# Patient Record
Sex: Female | Born: 1975 | Race: White | Hispanic: No | Smoking: Current every day smoker
Health system: Southern US, Community
[De-identification: ages and names within clinical notes are randomized; demographics above are authoritative.]

## PROBLEM LIST (undated history)

## (undated) DIAGNOSIS — R569 Unspecified convulsions: Secondary | ICD-10-CM

## (undated) DIAGNOSIS — G894 Chronic pain syndrome: Secondary | ICD-10-CM

## (undated) DIAGNOSIS — I10 Essential (primary) hypertension: Secondary | ICD-10-CM

## (undated) DIAGNOSIS — G629 Polyneuropathy, unspecified: Secondary | ICD-10-CM

## (undated) DIAGNOSIS — R7303 Prediabetes: Secondary | ICD-10-CM

## (undated) DIAGNOSIS — B1920 Unspecified viral hepatitis C without hepatic coma: Secondary | ICD-10-CM

## (undated) DIAGNOSIS — F411 Generalized anxiety disorder: Secondary | ICD-10-CM

## (undated) DIAGNOSIS — R000 Tachycardia, unspecified: Secondary | ICD-10-CM

## (undated) DIAGNOSIS — K259 Gastric ulcer, unspecified as acute or chronic, without hemorrhage or perforation: Secondary | ICD-10-CM

## (undated) DIAGNOSIS — E785 Hyperlipidemia, unspecified: Secondary | ICD-10-CM

## (undated) DIAGNOSIS — K7290 Hepatic failure, unspecified without coma: Secondary | ICD-10-CM

## (undated) DIAGNOSIS — N200 Calculus of kidney: Secondary | ICD-10-CM

## (undated) DIAGNOSIS — K219 Gastro-esophageal reflux disease without esophagitis: Secondary | ICD-10-CM

## (undated) DIAGNOSIS — F1910 Other psychoactive substance abuse, uncomplicated: Secondary | ICD-10-CM

## (undated) HISTORY — PX: FOOT SURGERY: SHX648

## (undated) HISTORY — DX: Tachycardia, unspecified: R00.0

## (undated) HISTORY — DX: Polyneuropathy, unspecified: G62.9

## (undated) HISTORY — PX: OTHER SURGICAL HISTORY: SHX169

## (undated) HISTORY — DX: Hyperlipidemia, unspecified: E78.5

## (undated) HISTORY — DX: Generalized anxiety disorder: F41.1

## (undated) HISTORY — PX: TUBAL LIGATION: SHX77

---

## 2000-06-07 ENCOUNTER — Ambulatory Visit (HOSPITAL_COMMUNITY): Admission: RE | Admit: 2000-06-07 | Discharge: 2000-06-07 | Payer: Self-pay

## 2001-03-01 ENCOUNTER — Encounter: Payer: Self-pay | Admitting: Emergency Medicine

## 2001-03-01 ENCOUNTER — Emergency Department (HOSPITAL_COMMUNITY): Admission: EM | Admit: 2001-03-01 | Discharge: 2001-03-01 | Payer: Self-pay | Admitting: Emergency Medicine

## 2002-04-05 ENCOUNTER — Encounter: Payer: Self-pay | Admitting: Emergency Medicine

## 2002-04-05 ENCOUNTER — Emergency Department (HOSPITAL_COMMUNITY): Admission: EM | Admit: 2002-04-05 | Discharge: 2002-04-05 | Payer: Self-pay | Admitting: Emergency Medicine

## 2002-05-15 ENCOUNTER — Emergency Department (HOSPITAL_COMMUNITY): Admission: EM | Admit: 2002-05-15 | Discharge: 2002-05-15 | Payer: Self-pay | Admitting: Emergency Medicine

## 2002-08-07 ENCOUNTER — Emergency Department (HOSPITAL_COMMUNITY): Admission: EM | Admit: 2002-08-07 | Discharge: 2002-08-07 | Payer: Self-pay | Admitting: *Deleted

## 2002-09-28 ENCOUNTER — Encounter: Payer: Self-pay | Admitting: *Deleted

## 2002-09-28 ENCOUNTER — Ambulatory Visit (HOSPITAL_COMMUNITY): Admission: RE | Admit: 2002-09-28 | Discharge: 2002-09-28 | Payer: Self-pay | Admitting: *Deleted

## 2002-10-17 ENCOUNTER — Ambulatory Visit (HOSPITAL_COMMUNITY): Admission: AD | Admit: 2002-10-17 | Discharge: 2002-10-17 | Payer: Self-pay | Admitting: *Deleted

## 2002-10-27 ENCOUNTER — Ambulatory Visit (HOSPITAL_COMMUNITY): Admission: AD | Admit: 2002-10-27 | Discharge: 2002-10-27 | Payer: Self-pay | Admitting: *Deleted

## 2002-11-14 ENCOUNTER — Ambulatory Visit (HOSPITAL_COMMUNITY): Admission: AD | Admit: 2002-11-14 | Discharge: 2002-11-14 | Payer: Self-pay | Admitting: *Deleted

## 2002-11-22 ENCOUNTER — Inpatient Hospital Stay (HOSPITAL_COMMUNITY): Admission: AD | Admit: 2002-11-22 | Discharge: 2002-11-24 | Payer: Self-pay | Admitting: *Deleted

## 2003-01-24 ENCOUNTER — Ambulatory Visit (HOSPITAL_COMMUNITY): Admission: RE | Admit: 2003-01-24 | Discharge: 2003-01-24 | Payer: Self-pay | Admitting: *Deleted

## 2003-03-21 ENCOUNTER — Other Ambulatory Visit: Admission: RE | Admit: 2003-03-21 | Discharge: 2003-03-21 | Payer: Self-pay | Admitting: *Deleted

## 2003-07-01 ENCOUNTER — Emergency Department (HOSPITAL_COMMUNITY): Admission: EM | Admit: 2003-07-01 | Discharge: 2003-07-01 | Payer: Self-pay | Admitting: Emergency Medicine

## 2003-08-27 ENCOUNTER — Emergency Department (HOSPITAL_COMMUNITY): Admission: EM | Admit: 2003-08-27 | Discharge: 2003-08-27 | Payer: Self-pay | Admitting: Emergency Medicine

## 2004-07-08 ENCOUNTER — Ambulatory Visit (HOSPITAL_COMMUNITY): Admission: RE | Admit: 2004-07-08 | Discharge: 2004-07-08 | Payer: Self-pay | Admitting: *Deleted

## 2005-03-28 ENCOUNTER — Emergency Department (HOSPITAL_COMMUNITY): Admission: EM | Admit: 2005-03-28 | Discharge: 2005-03-28 | Payer: Self-pay | Admitting: Emergency Medicine

## 2005-04-01 ENCOUNTER — Emergency Department (HOSPITAL_COMMUNITY): Admission: EM | Admit: 2005-04-01 | Discharge: 2005-04-01 | Payer: Self-pay | Admitting: Emergency Medicine

## 2006-07-31 ENCOUNTER — Emergency Department (HOSPITAL_COMMUNITY): Admission: EM | Admit: 2006-07-31 | Discharge: 2006-07-31 | Payer: Self-pay | Admitting: Emergency Medicine

## 2006-10-31 ENCOUNTER — Emergency Department (HOSPITAL_COMMUNITY): Admission: EM | Admit: 2006-10-31 | Discharge: 2006-10-31 | Payer: Self-pay | Admitting: Emergency Medicine

## 2007-04-25 ENCOUNTER — Emergency Department (HOSPITAL_COMMUNITY): Admission: EM | Admit: 2007-04-25 | Discharge: 2007-04-25 | Payer: Self-pay | Admitting: Emergency Medicine

## 2007-05-05 ENCOUNTER — Emergency Department (HOSPITAL_COMMUNITY): Admission: EM | Admit: 2007-05-05 | Discharge: 2007-05-05 | Payer: Self-pay | Admitting: Emergency Medicine

## 2007-05-13 ENCOUNTER — Ambulatory Visit (HOSPITAL_COMMUNITY): Admission: RE | Admit: 2007-05-13 | Discharge: 2007-05-13 | Payer: Self-pay | Admitting: Family Medicine

## 2007-09-08 ENCOUNTER — Emergency Department (HOSPITAL_COMMUNITY): Admission: EM | Admit: 2007-09-08 | Discharge: 2007-09-08 | Payer: Self-pay | Admitting: Emergency Medicine

## 2007-09-08 ENCOUNTER — Encounter: Payer: Self-pay | Admitting: Orthopedic Surgery

## 2007-09-29 ENCOUNTER — Encounter (INDEPENDENT_AMBULATORY_CARE_PROVIDER_SITE_OTHER): Payer: Self-pay | Admitting: *Deleted

## 2007-09-29 ENCOUNTER — Ambulatory Visit: Payer: Self-pay | Admitting: Orthopedic Surgery

## 2007-09-29 DIAGNOSIS — M25569 Pain in unspecified knee: Secondary | ICD-10-CM

## 2007-09-29 DIAGNOSIS — M24469 Recurrent dislocation, unspecified knee: Secondary | ICD-10-CM

## 2007-09-30 ENCOUNTER — Telehealth: Payer: Self-pay | Admitting: Orthopedic Surgery

## 2007-10-03 ENCOUNTER — Ambulatory Visit (HOSPITAL_COMMUNITY): Admission: RE | Admit: 2007-10-03 | Discharge: 2007-10-03 | Payer: Self-pay | Admitting: Orthopedic Surgery

## 2007-10-06 ENCOUNTER — Encounter: Payer: Self-pay | Admitting: Orthopedic Surgery

## 2007-10-11 ENCOUNTER — Ambulatory Visit: Payer: Self-pay | Admitting: Orthopedic Surgery

## 2007-10-13 ENCOUNTER — Encounter: Payer: Self-pay | Admitting: Orthopedic Surgery

## 2007-11-13 ENCOUNTER — Emergency Department (HOSPITAL_COMMUNITY): Admission: EM | Admit: 2007-11-13 | Discharge: 2007-11-13 | Payer: Self-pay | Admitting: Emergency Medicine

## 2007-11-15 ENCOUNTER — Encounter: Payer: Self-pay | Admitting: Orthopedic Surgery

## 2007-12-02 ENCOUNTER — Encounter: Payer: Self-pay | Admitting: Orthopedic Surgery

## 2008-03-21 ENCOUNTER — Emergency Department (HOSPITAL_COMMUNITY): Admission: EM | Admit: 2008-03-21 | Discharge: 2008-03-21 | Payer: Self-pay | Admitting: Emergency Medicine

## 2008-03-22 ENCOUNTER — Ambulatory Visit (HOSPITAL_COMMUNITY): Admission: RE | Admit: 2008-03-22 | Discharge: 2008-03-22 | Payer: Self-pay | Admitting: Emergency Medicine

## 2009-03-26 ENCOUNTER — Emergency Department (HOSPITAL_COMMUNITY): Admission: EM | Admit: 2009-03-26 | Discharge: 2009-03-26 | Payer: Self-pay | Admitting: Emergency Medicine

## 2009-06-07 ENCOUNTER — Emergency Department (HOSPITAL_COMMUNITY): Admission: EM | Admit: 2009-06-07 | Discharge: 2009-06-07 | Payer: Self-pay | Admitting: Emergency Medicine

## 2009-08-20 ENCOUNTER — Other Ambulatory Visit: Payer: Self-pay | Admitting: Emergency Medicine

## 2009-08-20 ENCOUNTER — Observation Stay (HOSPITAL_COMMUNITY): Admission: EM | Admit: 2009-08-20 | Discharge: 2009-08-20 | Payer: Self-pay | Admitting: Emergency Medicine

## 2009-08-26 ENCOUNTER — Emergency Department (HOSPITAL_COMMUNITY): Admission: EM | Admit: 2009-08-26 | Discharge: 2009-08-26 | Payer: Self-pay | Admitting: Emergency Medicine

## 2009-11-07 ENCOUNTER — Emergency Department (HOSPITAL_COMMUNITY): Admission: EM | Admit: 2009-11-07 | Discharge: 2009-11-07 | Payer: Self-pay | Admitting: Emergency Medicine

## 2009-11-27 ENCOUNTER — Emergency Department (HOSPITAL_COMMUNITY): Admission: EM | Admit: 2009-11-27 | Discharge: 2009-11-27 | Payer: Self-pay | Admitting: Emergency Medicine

## 2009-12-06 ENCOUNTER — Emergency Department (HOSPITAL_COMMUNITY): Admission: EM | Admit: 2009-12-06 | Discharge: 2009-12-06 | Payer: Self-pay | Admitting: Emergency Medicine

## 2009-12-09 ENCOUNTER — Emergency Department (HOSPITAL_COMMUNITY): Admission: EM | Admit: 2009-12-09 | Discharge: 2009-12-09 | Payer: Self-pay | Admitting: Emergency Medicine

## 2010-06-14 ENCOUNTER — Emergency Department (HOSPITAL_COMMUNITY)
Admission: EM | Admit: 2010-06-14 | Discharge: 2010-06-14 | Payer: Self-pay | Source: Home / Self Care | Admitting: Emergency Medicine

## 2010-08-23 ENCOUNTER — Emergency Department (HOSPITAL_COMMUNITY)
Admission: EM | Admit: 2010-08-23 | Discharge: 2010-08-23 | Payer: Self-pay | Source: Home / Self Care | Admitting: Emergency Medicine

## 2010-08-23 LAB — BASIC METABOLIC PANEL
BUN: 8 mg/dL (ref 6–23)
CO2: 25 mEq/L (ref 19–32)
Chloride: 103 mEq/L (ref 96–112)
Creatinine, Ser: 0.91 mg/dL (ref 0.4–1.2)
GFR calc Af Amer: >60 mL/min (ref >60)
GFR calc non Af Amer: >60 mL/min (ref >60)
GFR calc non Af Amer: >60 mL/min (ref >60)
Glucose, Bld: 100 mg/dL — ABNORMAL HIGH (ref 70–99)
Potassium: 4.1 mEq/L (ref 3.5–5.1)
Sodium: 140 mEq/L (ref 135–145)
Sodium: 136 mEq/L (ref 135–145)

## 2010-08-23 LAB — POCT PREGNANCY, URINE: Preg Test, Ur: NEGATIVE

## 2010-08-23 LAB — ACETAMINOPHEN LEVEL: Acetaminophen (Tylenol), Serum: <10 ug/mL — ABNORMAL LOW (ref 10–30)

## 2010-08-23 LAB — GLUCOSE, CAPILLARY: Glucose-Capillary: 123 mg/dL — ABNORMAL HIGH (ref 70–99)

## 2010-08-23 LAB — DIFFERENTIAL
Basophils Relative: 0 % (ref 0–1)
Eosinophils Absolute: 0.1 10*3/uL (ref 0.0–0.7)
Eosinophils Relative: 1 % (ref 0–5)
Lymphs Abs: 3.2 10*3/uL (ref 0.7–4.0)
Monocytes Absolute: 0.7 10*3/uL (ref 0.1–1.0)
Monocytes Relative: 7 % (ref 3–12)

## 2010-08-23 LAB — CBC
HCT: 38.9 % (ref 36.0–46.0)
Hemoglobin: 13.3 g/dL (ref 12.0–15.0)
RBC: 4.4 MIL/uL (ref 3.87–5.11)
RDW: 14.3 % (ref 11.5–15.5)
WBC: 9 10*3/uL (ref 4.0–10.5)

## 2010-08-23 LAB — HEPATIC FUNCTION PANEL
Bilirubin, Direct: 0.1 mg/dL (ref 0.0–0.3)
Total Bilirubin: 0.3 mg/dL (ref 0.3–1.2)
Total Protein: 7.2 g/dL (ref 6.0–8.3)

## 2010-09-10 ENCOUNTER — Emergency Department (HOSPITAL_COMMUNITY)
Admission: EM | Admit: 2010-09-10 | Discharge: 2010-09-10 | Disposition: A | Discharge status: Home / Self Care | Payer: Self-pay | Attending: Emergency Medicine | Admitting: Emergency Medicine

## 2010-09-10 DIAGNOSIS — N61 Mastitis without abscess: Secondary | ICD-10-CM | POA: Insufficient documentation

## 2010-10-07 LAB — CBC
MCHC: 33 g/dL (ref 30.0–36.0)
Platelets: 297 10*3/uL (ref 150–400)
RDW: 15 % (ref 11.5–15.5)
WBC: 13.3 10*3/uL — ABNORMAL HIGH (ref 4.0–10.5)

## 2010-10-07 LAB — BASIC METABOLIC PANEL
BUN: 8 mg/dL (ref 6–23)
Creatinine, Ser: 0.67 mg/dL (ref 0.4–1.2)
GFR calc non Af Amer: >60 mL/min (ref >60)
Glucose, Bld: 103 mg/dL — ABNORMAL HIGH (ref 70–99)

## 2010-10-07 LAB — URINALYSIS, ROUTINE W REFLEX MICROSCOPIC
Bilirubin Urine: NEGATIVE
Ketones, ur: NEGATIVE mg/dL
Nitrite: NEGATIVE
Protein, ur: NEGATIVE mg/dL
Urobilinogen, UA: 1 mg/dL (ref 0.0–1.0)
pH: 6.5 (ref 5.0–8.0)

## 2010-10-07 LAB — DIFFERENTIAL
Basophils Absolute: 0 10*3/uL (ref 0.0–0.1)
Basophils Relative: 0 % (ref 0–1)
Eosinophils Relative: 2 % (ref 0–5)
Lymphocytes Relative: 25 % (ref 12–46)
Neutro Abs: 9.2 10*3/uL — ABNORMAL HIGH (ref 1.7–7.7)

## 2010-10-07 LAB — PREGNANCY, URINE: Preg Test, Ur: NEGATIVE

## 2010-10-12 LAB — CBC
MCV: 88 fL (ref 78.0–100.0)
Platelets: 241 10*3/uL (ref 150–400)
RBC: 4.59 MIL/uL (ref 3.87–5.11)
WBC: 9 10*3/uL (ref 4.0–10.5)

## 2010-10-12 LAB — BASIC METABOLIC PANEL
BUN: 4 mg/dL — ABNORMAL LOW (ref 6–23)
Chloride: 110 mEq/L (ref 96–112)
Creatinine, Ser: 0.56 mg/dL (ref 0.4–1.2)
GFR calc Af Amer: >60 mL/min (ref >60)
GFR calc non Af Amer: >60 mL/min (ref >60)
Potassium: 4.1 mEq/L (ref 3.5–5.1)

## 2010-10-12 LAB — DIFFERENTIAL
Eosinophils Absolute: 0.1 10*3/uL (ref 0.0–0.7)
Lymphocytes Relative: 19 % (ref 12–46)
Lymphs Abs: 1.7 10*3/uL (ref 0.7–4.0)
Monocytes Relative: 3 % (ref 3–12)
Neutrophils Relative %: 77 % (ref 43–77)

## 2010-10-13 LAB — CULTURE, ROUTINE-ABSCESS

## 2010-10-13 LAB — ANAEROBIC CULTURE

## 2010-10-14 LAB — DIFFERENTIAL
Lymphocytes Relative: 28 % (ref 12–46)
Lymphs Abs: 2.3 10*3/uL (ref 0.7–4.0)
Monocytes Relative: 6 % (ref 3–12)
Neutrophils Relative %: 64 % (ref 43–77)

## 2010-10-14 LAB — CBC
HCT: 41.2 % (ref 36.0–46.0)
Platelets: 211 10*3/uL (ref 150–400)
RBC: 4.69 MIL/uL (ref 3.87–5.11)
WBC: 8.3 10*3/uL (ref 4.0–10.5)

## 2010-10-14 LAB — URINALYSIS, ROUTINE W REFLEX MICROSCOPIC
Glucose, UA: NEGATIVE mg/dL
Hgb urine dipstick: NEGATIVE
Protein, ur: NEGATIVE mg/dL
Specific Gravity, Urine: 1.025 (ref 1.005–1.030)
pH: 6 (ref 5.0–8.0)

## 2010-10-14 LAB — URINE MICROSCOPIC-ADD ON

## 2010-10-14 LAB — ETHANOL: Alcohol, Ethyl (B): <5 mg/dL (ref 0–10)

## 2010-10-14 LAB — BASIC METABOLIC PANEL
BUN: 7 mg/dL (ref 6–23)
Creatinine, Ser: 0.84 mg/dL (ref 0.4–1.2)
GFR calc Af Amer: >60 mL/min (ref >60)
GFR calc non Af Amer: >60 mL/min (ref >60)
Potassium: 3.9 mEq/L (ref 3.5–5.1)

## 2010-10-14 LAB — RAPID URINE DRUG SCREEN, HOSP PERFORMED
Barbiturates: NOT DETECTED
Benzodiazepines: POSITIVE — AB

## 2010-10-14 LAB — ACETAMINOPHEN LEVEL: Acetaminophen (Tylenol), Serum: <10 ug/mL — ABNORMAL LOW (ref 10–30)

## 2010-10-29 LAB — URINALYSIS, ROUTINE W REFLEX MICROSCOPIC
Glucose, UA: NEGATIVE mg/dL
pH: 6 (ref 5.0–8.0)

## 2010-10-29 LAB — URINE MICROSCOPIC-ADD ON

## 2010-12-04 ENCOUNTER — Emergency Department (HOSPITAL_COMMUNITY)
Admission: EM | Admit: 2010-12-04 | Discharge: 2010-12-04 | Disposition: A | Discharge status: Home / Self Care | Payer: Self-pay | Attending: Emergency Medicine | Admitting: Emergency Medicine

## 2010-12-04 ENCOUNTER — Emergency Department (HOSPITAL_COMMUNITY): Payer: Self-pay

## 2010-12-04 DIAGNOSIS — S93409A Sprain of unspecified ligament of unspecified ankle, initial encounter: Secondary | ICD-10-CM | POA: Insufficient documentation

## 2010-12-04 DIAGNOSIS — Y92009 Unspecified place in unspecified non-institutional (private) residence as the place of occurrence of the external cause: Secondary | ICD-10-CM | POA: Insufficient documentation

## 2010-12-04 DIAGNOSIS — X500XXA Overexertion from strenuous movement or load, initial encounter: Secondary | ICD-10-CM | POA: Insufficient documentation

## 2010-12-12 NOTE — Discharge Summary (Signed)
Mary Jarvis, Mary Jarvis                        ACCOUNT NO.:  [id number]   MEDICAL RECORD NO.:  [id number]                   PATIENT TYPE:  INP   LOCATION:  A428                                 FACILITY:  [facility]   PHYSICIAN:  Langley Gauss, M.D.                DATE OF BIRTH:  03-28-1976   DATE OF ADMISSION:  11/22/2002  DATE OF DISCHARGE:  11/25/2002                                 DISCHARGE SUMMARY   DIAGNOSIS:  A 35 year old gravida 3, para 1 at 38+ weeks gestation admitted  for induction of labor.   PROCEDURE:  1. Placement of continuous lumbar epidural analgesia.  The patient did     require placement of 2 epidurals to achieve adequate labor block.  2. Low vacuum extraction of a viable vigorous female infant.   COMPLICATIONS:  Delivery was complicated by fetal heart rate decelerations  to 60-70 beats/minute x16 minutes duration as well as postpartum uterine  atony with blood loss and resultant anemia.   DISPOSITION:  The patient is given a copy of the standardized discharge  instructions.  She will follow up in the office in 4-6 weeks time for  postpartum evaluation. In addition, she is advised to contact our office  within 1 week to make arrangements to have a circumcision performed.  The  patient does desire circumcision on a newborn infant, but she has failed to  initiate financial planning which would allow her to pay for this noncovered  service at this time.   LABORATORY DATA:  Pertinent laboratory studies:  Hemoglobin and hematocrit  10.7/31.4 with a white count of 9.4.  On postpartum day #1 hemoglobin 8.8,  hematocrit 26.2 with a white count of 16.9.  Other pertinent laboratory  studies: Arterial cord blood gas obtained on the infant at the time of  delivery reveals a pH of 7.030, pCO2 81.0, pO2 of 15.0.   HOSPITAL COURSE:  See previous dictations.  The patient delivered the p.m.  of 11/22/2002.  The infant did require bag and mask ventilation for  resuscitative  efforts by the nursing staff. Postpartum the infant did very  well. The infant was under the Oxyhood for postdelivery the first p.m.  following admission to the nursery.  Thereafter the infant was rapidly  weaned from the [facility] and was able to stay within the patient's room  without any supplemental oxygen during the remainder of the hospital stay.  The patient, thereafter, did well bonding with the infant.  She bottle fed.  She had no postpartum complications, thus the patient was discharged home on  postpartum day #2; and, as stated previously, to make arrangements to have  infant circumcision performed in our office.  Langley Gauss, M.D.    DC/MEDQ  D:  11/27/2002  T:  11/27/2002  Job:  [id number]

## 2010-12-12 NOTE — H&P (Signed)
   NAMEGLANDA, Jarvis                        ACCOUNT NO.:  [id number]   MEDICAL RECORD NO.:  [id number]                   PATIENT TYPE:  INP   LOCATION:  A428                                 FACILITY:  [facility]   PHYSICIAN:  Langley Gauss, M.D.                DATE OF BIRTH:  Oct 30, 1975   DATE OF ADMISSION:  11/22/2002  DATE OF DISCHARGE:                                HISTORY & PHYSICAL   HISTORY OF PRESENT ILLNESS:  The patient is a 35 year old gravida 3 para 1  at 38-plus weeks gestation admitted for induction of labor.  The patient's  prenatal course had been uncomplicated.  She was given RhoGAM on 10/04/2002  with a resultant anti-D on antibody screen now when the patient presents for  delivery.   PAST MEDICAL HISTORY:  She has a history of reflux disease, previously was  treated for H. pylori infection.   CURRENT MEDICATIONS:  1. Zantac 150 mg p.o. b.i.d. p.r.n.  2. Fioricet one to two every 6 hours p.r.n. headache.   PHYSICAL EXAMINATION:  GENERAL:  No acute distress.  VITAL SIGNS:  Height 5 feet, temperature 97, blood pressure 113/73, pulse  100, respiratory rate is 20.  HEENT:  Negative.  No adenopathy.  Neck is supple.  Thyroid is nonpalpable.  LUNGS:  Clear.  CARDIOVASCULAR:  Regular rate and rhythm.  ABDOMEN:  Soft and nontender.  No surgical scars are identified.  The  patient is vertex presentation by Leopold's maneuvers.  PELVIC:  Normal external genitalia.  Cervix 2, 60, -1, posterior position.   EXTERNAL FETAL MONITOR:  Reassuring fetal heart rate.   ASSESSMENT:  Term intrauterine pregnancy, favorable cervix in this  multiparous patient.   PLAN:  Thus, we will proceed with amniotomy and induce with Pitocin if  clinically indicated.                                               Langley Gauss, M.D.    DC/MEDQ  D:  11/23/2002  T:  11/24/2002  Job:  [id number]

## 2010-12-12 NOTE — Op Note (Signed)
[name], Mary Jarvis                        ACCOUNT NO.:  [id number]   MEDICAL RECORD NO.:  [id number]                   PATIENT TYPE:  INP   LOCATION:  A428                                 FACILITY:  [facility]   PHYSICIAN:  Langley Gauss, M.D.                DATE OF BIRTH:  1976/01/10   DATE OF PROCEDURE:  11/22/2002  DATE OF DISCHARGE:                                 OPERATIVE REPORT   PROCEDURE:  Placement of continuous lumbar epidural analgesia at the L2-L3  interspace performed by Langley Gauss, M.D.   COMPLICATIONS:  None.   SUMMARY:  The epidural previously placed earlier in the day was inadequate  for labor analgesia. Although the patient initial had evidence of a  bilateral block with good relief of labor pain, the patient subsequently had  minimal relief despite administration of 10 mL of 2% lidocaine. The patient  had also received IV Nubain and Phenergan in an effort to provide  significant sedation to tolerate labor; however, this also was inadequate.  Thus, the patient was again placed in the seated position and bony landmarks  were identified. The L2-L3 interspace was chosen. The patient's back was  sterilely prepped and draped in the usual manner. 5 mL of 1% lidocaine plain  injected at the midline of the L2-L3 interspace to raise a small skin wheal.  The 17 gauge Tuohy-Schliff needle is again utilized to identify the epidural  space. On the first attempt, the bony spinous process was encountered thus  the epidural needle was withdrawn and directed slightly caudad. On the  second attempt there was excellent loss of resistance consistent with entry  into the epidural space. Initial test dose of 3 mL of 1.5% lidocaine plus  epinephrine injected through the epidural needle and no signs of CSF or  intravascular injection obtained, thus the epidural catheter is inserted to  a depth of 5 cm, epidural needle is removed, the aspiration test is  negative. A second test dose  of 2 mL of 1.5% lidocaine plus epinephrine  injected through the epidural catheter line. Again no signs of CSF or  intravascular injection obtained. The patient is having evidence of an  appropriately setting up of epidural block, thus in an effort to expedite  the pain relief, an additional 5, 5 mL of 2% lidocaine is injected through  the epidural catheter. The catheter is then secured into place. The patient  is returned to the left lateral supine position at which time she does have  significant heaviness in the legs, warmth and tingling consistent with a  bilateral epidural block. She is thus connected to the infusion pump  containing the standard mixture which will be infused at a rate of 14  mL/hour. Following the procedure, the patient is noted to be 8 cm dilated,  plus 1 station and completely effaced.  Langley Gauss, M.D.    DC/MEDQ  D:  11/22/2002  T:  11/22/2002  Job:  [id number]

## 2010-12-12 NOTE — Op Note (Signed)
NAMEGERRICA, CYGAN                        ACCOUNT NO.:  [id number]   MEDICAL RECORD NO.:  [id number]                   PATIENT TYPE:  INP   LOCATION:  LDR1                                 FACILITY:  [facility]   PHYSICIAN:  Langley Gauss, M.D.                DATE OF BIRTH:  02-Jun-1976   DATE OF PROCEDURE:  11/22/2002  DATE OF DISCHARGE:                                 OPERATIVE REPORT   PROCEDURE:  Placement of continuous lumbar epidural analgesia at the L3-L4  interspace.   COMPLICATIONS:  None.   SUMMARY:  Appropriate informed consent was obtained. The patient is placed  in the seated position, bony landmarks were identified. The L3-L4 interspace  was selected. The patient's back was sterilely prepped and draped utilizing  the epidural kit, 5 mL 1% lidocaine plain injected at the midline of the L3-  L4 interspace to raise a small skin wheal. The 17 gauge Tuohy-Schliff needle  was then utilized with loss of resistance in an air filled glass syringe and  identified the epidural space. The first two attempts were unsuccessful as  bony spinous process was encountered, thus I moved my choice of positions to  the L2-L3 interspace, 5 mL 1% lidocaine plain injected at the site to raise  a small skin wheal. Again the epidural needle was utilized and on the third  attempt again the epidural space was not successfully encountered. Thus, the  patient is reexamined and it is determined that the initial attempts were  slightly off to the right of the midline, thus the L3-L4 interspace is again  utilized. The 17 gauge Tuohy-Schliff needle on this fourth attempt was noted  to enter the epidural space without difficulty. Excellent loss of resistance  was noted, initial test dose 3 mL of 1.5% lidocaine plus epinephrine  injected through the epidural needle. No signs of CSF or intravascular  injection obtained. The epidural catheter was then inserted to a depth of 5  cm. Aspiration test is  negative. Epidural catheter secured into place. Two  boluses and a 5, 5 mL of 2% lidocaine are then injected to set up a rapid  epidural block. The patient is then connected to the infusion pump  containing the standard mixture. She will be treated with bolus of 10 mL  followed by a continuous infusion rate of 15 mL per hour. Following  completion of the procedure, the patient is noted to have evidence of a  bilateral epidural block with good labor analgesia. She did have some  vomiting immediately post procedure; however, fetal heart rate remained  reassuring.                                               Langley Gauss, M.D.    DC/MEDQ  D:  11/22/2002  T:  11/22/2002  Job:  [id number]

## 2010-12-12 NOTE — H&P (Signed)
Mary Jarvis, Mary Jarvis              ACCOUNT NO.:  [id number]   MEDICAL RECORD NO.:  [id number]           PATIENT TYPE:   LOCATION:                                 FACILITY:   PHYSICIAN:  Langley Gauss, MD          DATE OF BIRTH:   DATE OF ADMISSION:  07/08/2004  DATE OF DISCHARGE:  LH                                HISTORY & PHYSICAL   HISTORY OF PRESENT ILLNESS:  The patient is scheduled for removal of IUD,  ___________IUD, and laparoscopic tubal ligation and lysis by bipolar  cautery.  The patient understands and accepts the irreversible nature of the  procedure, and also realizes and accepts the inherent 2% failure rate  associated with the procedure.  The patient had previously considered having  permanent sterilization performed in June 2004, following her most recent  delivery on November 22, 2002;  however, at that time she was somewhat  indecisive and thus delayed her decision.  However, at this point in time  she is absolutely certain regarding her desire for permanent and  irreversible sterilization.  The patient is a 35 year old gravida 3, para 2,  has two living children, is certain she would at no point in time desire any  additional childbearing.   PAST MEDICAL HISTORY:  1.  Two prior vaginal deliveries.  2.  History of reflux disease.  3.  Previously was treated for H. pylori infection.   CURRENT MEDICATIONS:  1.  Lortab p.r.n.  2.  On May 01, 2004, the patient doxycycline and Cipro empirically for      complaints of pelvic pain and menorrhagia.  3.  Xanax p.r.n. 1 mg tablets 1/2 to 1 p.o. q.8h. p.r.n.   ALLERGIES:  No known drug allergies.   REVIEW OF SYSTEMS:  Pertinent for pain on a daily basis, particularly though  with intercourse.  The patient also states that she does experience  dysmenorrhea with her menses.  The ______________IUD was placed without  complications in the office on March 08, 2003.  The patient has had several  ultrasounds since that  point in time which have documented a proper  intrauterine placement, no evidence of any uterine perforation, and string  has always been visible.  The patient will have this removed at time of the  laparoscopic tubal ligation utilizing bipolar cautery.  Review of systems  otherwise negative.   PHYSICAL EXAMINATION:  VITAL SIGNS:  Blood pressure 134/82, pulse 105,  weight 160.  HEENT:  Negative.  No adenopathy.  NECK:  Supple.  Thyroid is nonpalpable.  LUNGS:  Clear.  CARDIOVASCULAR:  Regular rate and rhythm.  ABDOMEN:  Soft and nontender.  No surgical scars are identified.  PELVIC:  Normal external genitalia.  No lesions or ulcerations identified.  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  Cervix  is noted to be without lesions.  Uterus noted to be anteflexed, normal size,  shape, and consistency for a multiparous patient.  The adnexa are noted to  be palpably normal.   ASSESSMENT AND PLAN:  We will  remove IUD.  The patient at this point in time  clearly desires permanent sterilization.     Vira Blanco   DC/MEDQ  D:  06/28/2004  T:  06/28/2004  Job:  [id number]

## 2010-12-12 NOTE — Op Note (Signed)
Mary Jarvis, Mary Jarvis              ACCOUNT NO.:  [id number]   MEDICAL RECORD NO.:  [id number]          PATIENT TYPE:  AMB   LOCATION:  DAY                           FACILITY:  [facility]   PHYSICIAN:  Langley Gauss, MD     DATE OF BIRTH:  03/02/1976   DATE OF PROCEDURE:  07/08/2004  DATE OF DISCHARGE:                                 OPERATIVE REPORT   PREOPERATIVE DIAGNOSES:  1.  Multiparity.  2.  Patient desires permanent sterilization.   PROCEDURES PERFORMED:  1.  Removal of ParaGard intrauterine device.  2.  Laparoscopic tubal ligation utilizing bipolar cautery.   SURGEON:  Langley Gauss, MD   SPECIMENS:  The intact ParaGard IUD with 2 cm long attached string is sent  for permanent specimen only.  No other specimens are obtained during the  operative procedure.   Findings at the time of surgery include a uterus with an enlarged, prominent  fundus with some irregular contours noted posteriorly, which most likely  represent uterine leiomyoma.  There is no adhesive disease within the  anterior or posterior cul-de-sac.  The tubes and ovaries are noted to be  normal in appearance with the exception of slightly enlarged ovaries  bilaterally consistent with developing follicular cysts.   ESTIMATED BLOOD LOSS:  Minimal.   ANESTHESIA:  General endotracheal.   SUMMARY:  The planned operative procedure was discussed with the patient  immediately preoperatively.  It was again confirmed that we would be  removing the intrauterine device.  In addition, laparoscopic tubal ligation  utilizing bipolar cautery was to be performed.  Vital signs were stable.  The patient was taken to the operating room and underwent uncomplicated  induction of general anesthesia, after which time she was placed in the low  lithotomy position and prepped and draped in the usual sterile manner.  A  sterile speculum examination was then performed.  Anterior lip of the cervix  was grasped with a single-tooth  tenaculum.  The string from the ParaGard IUD  was easily visualized.  This was grasped with an Allis clamp and easily  removed.  The specimen is examined and is noted to be a normal-appearing T-  shaped intrauterine device with the copper coils present.  This is handed  off for laboratory specimen.  A Hulka intrauterine manipulator is then  passed through the endocervical os and used to grasp the cervix at 12  o'clock.  I then changed the sterile gloves.  Standing at the patient's left  side, a 1 cm vertical incision is made just inferior to the umbilicus.  Likewise, a suprapubic midline incision is performed.  The abdominal wall  was then elevated.  The Veress needle was then passed through the  subumbilical incision angling perpendicular to the fascial plane.  This is  done atraumatically.  Appropriate loss of resistance is noted.  A drop test  then confirms a proper intraperitoneal placement with no apparent bowel or  vascular injury.  CO2 gas 3.2 L at a filling pressure of less than 17 mmHg  is then inserted.  This results in excellent pneumoperitoneum.  The  Veress  needle is then removed.  The abdominal wall is again elevated.  The 10 mm  trocar and sleeve are inserted through the subumbilical incision.  The  abdominal wall is elevated with my left hand as the trocar and sleeve are  used to angle toward the hollow of the sacrum.  This is done under direct  visualization with the Visiport disposable cannula and trocar.  This is  connected to the camera.  This results in direct visualization as we  atraumatically enter the peritoneal cavity.  Pneumoperitoneum is maintained  throughout the operative procedure.  Pelvic contents as described  previously.  Pertinently no adhesive disease is encountered, all pelvic  structures are noted to be freely mobile.  Additionally, prior to the  procedure gentle traction on the cervix had resulted in excellent descent of  the cervix and associated  uterus with the uterosacral ligaments descending  beyond the hymenal ring.  Under direct visualization suprapubically in the  midline, a disposable 5 mm trocar and sleeve are inserted without difficulty  and inserted atraumatically.  The uterus is then elevated.  The contents as  described previously are photographed.  Each of the fallopian tube is  identified and properly confirmed by tracing them down out to their  fimbriated ends as well as tracing them to their junction with the uterus  itself.  The Kleppinger bipolar cauterization forceps was then utilized with  the bipolar cauterization technique described by Kleppinger, with the most  proximal cauterization being 1 cm from the tubal-uterine junction.  At all  times the cauterizing current is continued until the LED lights up plateaus  down at 1.  This results in extensive tubal destruction bilaterally.  On  each of the fallopian tube, a total of three bipolar cauterizations is  performed, each in direct continuity with the previous and, as stated  previously, the most proximal being 1 cm from the tubal-uterine junction.  Photographs are taken of the tubular destruction.  The remainder of the  pelvis was surveyed as per previously discussed.  Notably, the uterine  fundus is noted to be diffusely enlarged, consistent with the multiparous  stated, with some obvious irregular 1-2 cm areas of nodularity in the  posterior fundal portion of the uterus, which undoubtedly represent all  uterine leiomyoma.  The procedure is then terminated.  Gas is allowed to  escape.  Our instruments are removed.  The incision sites are then closed  utilizing a 2-0 Vicryl suture through-and-through Scarpa's fascia.  This  assists in reapproximation of the skin edges.  Dermabond skin glue is then  utilized to complete the closure of the skin edges.  This results in an excellent and secure closure.  To facilitate postoperative analgesia, a  total of 10 mL of  0.5% bupivacaine plain is then injected along our incision  site.  The patient tolerates this procedure very well.  The specimen as well  as Hulka intrauterine manipulator are removed from the endocervix.  Vital  signs remained stable throughout the operative procedure.  She is reversed  from anesthesia and taken to the recovery room in stable condition, at which  time operative findings discussed with the patient's awaiting family.   DISCHARGE MEDICATIONS:  The patient has received 1 g of IV Ancef immediately  prior to the procedure.  She is given a prescription for __________ for  postoperative pain relief.  She likewise is given a copy of the standardized  discharge instructions and will follow up in  the office in one week's time.  Pertinently, prior to performance of the tubal ligation, I had stated to the  patient that removal of the IUD could result in some deterioration of her  menstrual periods, more specifically with more cramping and bleeding, and  thus it could possibly be indicated for her to reinitiate oral  contraceptives for menstrual regulatory purposes.   DISPOSITION:  The patient did well postoperatively, had stable vital signs,  satisfied all criteria.  Thus, with vital signs stable, HCG negative, the  patient was discharged to home on the same date of service in the company of  her husband.     Vira Blanco   DC/MEDQ  D:  07/08/2004  T:  07/09/2004  Job:  [id number]

## 2010-12-12 NOTE — Op Note (Signed)
Mary Jarvis, Mary Jarvis                        ACCOUNT NO.:  [id number]   MEDICAL RECORD NO.:  [id number]                   PATIENT TYPE:  INP   LOCATION:  A428                                 FACILITY:  [facility]   PHYSICIAN:  Langley Gauss, M.D.                DATE OF BIRTH:  March 19, 1976   DATE OF PROCEDURE:  11/22/2002  DATE OF DISCHARGE:                                 OPERATIVE REPORT   DELIVERY NOTE:   DIAGNOSIS:  A 38+ week intrauterine pregnancy for induction of labor.   PROCEDURE:  Placement of continuous lumbar epidural analgesia, the first  being nonfunctional, requiring replacement; both by Dr. Roylene Reason. [name].   DELIVERY PERFORMED:  Low vacuum extraction of a viable, female infant  delivered over an intact perineum.  Delivery performed by Dr. Roylene Reason.  [name].   ESTIMATED BLOOD LOSS:  Less than 500 cc.   COMPLICATIONS OF DELIVERY:  The patient was noted to have a terminal  deceleration of the fetal heart rate with fetal heart rate of 60-80 beats  per minute times a total of 16 minutes duration during the second stage,  leading up to delivery.  Immediately following delivery the patient was  noted to experience significant uterine atony which required aggressive  fundal massage by Dr. Roylene Reason. [name] to achieve adequate uterine tone.   SUMMARY:  The patient had been admitted for inductin of labor.  The first  epidural was placed with active labor. This initially functioned well;  however, thereafter it was very clear it was nonfunctional.  Thus, with the  patient at 6-8 cm dilatation the epidural is replaced. The second epidural  functioned very well.  The patient did not experience any significant  hypotension following placement of the epidural.  However, shortly after  placement of the epidural the patient had the terminal bradycardia with the  heart rate of 60-80 beats/minute without recovery.  Multiple position  changes were made from left to right.  Digital  examination did not reveal  any evidence of any prolapsed cord.  Cervix was 8 cm dilated at that time.  The patient was noted to have significant uterine activity and felt that  this may represent some uterine hyperactivity.  Pitocin had been  discontinued.  The patient was treated with 0.25 mg of subcu terbutaline.   The patient was again re-examined, noted to be 9 cm dilatation with only an  anterior lip present with the fetal heart rate continuing to decelerate and  an estimated fetal weight of 7 pounds with a clinically adequate pelvis the  patient was placed in the dorsal lithotomy position in an effort to get her  to push.  With subsequent pushing efforts the anterior lip of the cervix was  reduced to 10 cm.  The Kiwi vacuum extractor was then placed on the infant's  vertex.  With the subsequent next 2 contractions keeping the pressure within  the same green range at all times, gentle traction resulted in delivery of  the infant in a direct OA position over the intact perineum.  The mouth and  nares were bulb suctioned of clear amniotic fluid.  The cord was doubly  clamped and cut and the infant was handed immediately to the nursing staff  who are prepared to proceed with the resuscitative efforts.   The resuscitative efforts consisted of bag and mask ventilation. No chest  compressions were required.  Arterial cord gas and cord blood were then  obtained from the placenta.  Gentle traction on the placenta then resulted  in easy delivery of an intact placenta with associated 3-vessel umbilical  cord.   Examination of the patient following delivery reveals intact perineum. No  cervical lacerations.  Vaginal side wall intact.  The patient is noted to  have several external condyloma, the largest being 1-cm in diameter.  These  are noted at the time of delivery and will be removed in an office based  setting.   Following delivery the patient is taken out of the dorsal lithotomy  position  and rolled to her side; at which time, the epidural catheter is removed with  the blue tip noted to be intact.  The infant responded well to the  resuscitative efforts per the nursing staff.  In addition the infant had  been treated with Narcan as the patient had received IV narcotics prior to  replacement of the epidural.  Dr. Vivia Ewing was contacted and is present  to assist in evaluation of the infant.                                               Langley Gauss, M.D.    DC/MEDQ  D:  11/23/2002  T:  11/23/2002  Job:  [id number]

## 2011-04-21 LAB — CBC
Hemoglobin: 14.8
MCHC: 33.7
MCV: 89.5
RBC: 4.91

## 2011-04-21 LAB — BASIC METABOLIC PANEL
CO2: 23
Calcium: 10.3
Chloride: 99
GFR calc Af Amer: >60
Potassium: 3.7
Sodium: 137

## 2011-04-21 LAB — URINALYSIS, ROUTINE W REFLEX MICROSCOPIC
Hgb urine dipstick: NEGATIVE
Nitrite: NEGATIVE
Protein, ur: NEGATIVE
Specific Gravity, Urine: >1.03 — ABNORMAL HIGH
Urobilinogen, UA: 0.2

## 2011-04-21 LAB — DIFFERENTIAL
Basophils Absolute: 0
Basophils Relative: 0
Eosinophils Absolute: 0
Monocytes Absolute: 0.2
Monocytes Relative: 1 — ABNORMAL LOW
Neutro Abs: 14.2 — ABNORMAL HIGH

## 2011-05-07 LAB — URINALYSIS, ROUTINE W REFLEX MICROSCOPIC
Bilirubin Urine: NEGATIVE
Glucose, UA: 100 — AB
Hgb urine dipstick: NEGATIVE
Ketones, ur: NEGATIVE
Protein, ur: NEGATIVE
Urobilinogen, UA: 0.2

## 2011-10-29 ENCOUNTER — Emergency Department (HOSPITAL_COMMUNITY): Payer: No Typology Code available for payment source

## 2011-10-29 ENCOUNTER — Emergency Department (HOSPITAL_COMMUNITY)
Admission: EM | Admit: 2011-10-29 | Discharge: 2011-10-29 | Disposition: A | Discharge status: Home / Self Care | Payer: No Typology Code available for payment source | Attending: Emergency Medicine | Admitting: Emergency Medicine

## 2011-10-29 ENCOUNTER — Encounter (HOSPITAL_COMMUNITY): Payer: Self-pay

## 2011-10-29 DIAGNOSIS — R079 Chest pain, unspecified: Secondary | ICD-10-CM | POA: Insufficient documentation

## 2011-10-29 DIAGNOSIS — N644 Mastodynia: Secondary | ICD-10-CM | POA: Insufficient documentation

## 2011-10-29 DIAGNOSIS — S20219A Contusion of unspecified front wall of thorax, initial encounter: Secondary | ICD-10-CM | POA: Insufficient documentation

## 2011-10-29 DIAGNOSIS — M25519 Pain in unspecified shoulder: Secondary | ICD-10-CM | POA: Insufficient documentation

## 2011-10-29 DIAGNOSIS — R51 Headache: Secondary | ICD-10-CM | POA: Insufficient documentation

## 2011-10-29 DIAGNOSIS — R109 Unspecified abdominal pain: Secondary | ICD-10-CM | POA: Insufficient documentation

## 2011-10-29 DIAGNOSIS — I10 Essential (primary) hypertension: Secondary | ICD-10-CM | POA: Insufficient documentation

## 2011-10-29 DIAGNOSIS — M79609 Pain in unspecified limb: Secondary | ICD-10-CM | POA: Insufficient documentation

## 2011-10-29 DIAGNOSIS — R404 Transient alteration of awareness: Secondary | ICD-10-CM | POA: Insufficient documentation

## 2011-10-29 DIAGNOSIS — R0602 Shortness of breath: Secondary | ICD-10-CM | POA: Insufficient documentation

## 2011-10-29 DIAGNOSIS — M25419 Effusion, unspecified shoulder: Secondary | ICD-10-CM | POA: Insufficient documentation

## 2011-10-29 DIAGNOSIS — H539 Unspecified visual disturbance: Secondary | ICD-10-CM | POA: Insufficient documentation

## 2011-10-29 DIAGNOSIS — R609 Edema, unspecified: Secondary | ICD-10-CM | POA: Insufficient documentation

## 2011-10-29 HISTORY — DX: Essential (primary) hypertension: I10

## 2011-10-29 HISTORY — DX: Calculus of kidney: N20.0

## 2011-10-29 LAB — COMPREHENSIVE METABOLIC PANEL
ALT: 35 U/L (ref 0–35)
AST: 24 U/L (ref 0–37)
CO2: 25 mEq/L (ref 19–32)
Calcium: 9 mg/dL (ref 8.4–10.5)
Creatinine, Ser: 0.85 mg/dL (ref 0.50–1.10)
GFR calc Af Amer: >90 mL/min (ref >90)
GFR calc non Af Amer: 89 mL/min — ABNORMAL LOW (ref >90)
Glucose, Bld: 104 mg/dL — ABNORMAL HIGH (ref 70–99)
Sodium: 135 mEq/L (ref 135–145)
Total Protein: 7.3 g/dL (ref 6.0–8.3)

## 2011-10-29 LAB — CBC
HCT: 41.1 % (ref 36.0–46.0)
MCH: 29.4 pg (ref 26.0–34.0)
MCV: 90.7 fL (ref 78.0–100.0)
Platelets: 234 10*3/uL (ref 150–400)
RDW: 14.2 % (ref 11.5–15.5)

## 2011-10-29 LAB — DIFFERENTIAL
Basophils Absolute: 0 10*3/uL (ref 0.0–0.1)
Eosinophils Absolute: 0.2 10*3/uL (ref 0.0–0.7)
Eosinophils Relative: 2 % (ref 0–5)
Lymphocytes Relative: 37 % (ref 12–46)
Monocytes Absolute: 0.5 10*3/uL (ref 0.1–1.0)

## 2011-10-29 MED ORDER — HYDROCODONE-ACETAMINOPHEN 5-325 MG PO TABS: 1.0000 | ORAL_TABLET | Freq: Four times a day (QID) | ORAL | Status: AC | PRN

## 2011-10-29 MED ORDER — SODIUM CHLORIDE 0.9 % IV SOLN
Freq: Once | INTRAVENOUS | Status: AC
  Administered 2011-10-29: 11:00:00 via INTRAVENOUS

## 2011-10-29 MED ORDER — ONDANSETRON HCL 4 MG/2ML IJ SOLN
4.0000 mg | Freq: Once | INTRAMUSCULAR | Status: AC
  Administered 2011-10-29: 4 mg via INTRAVENOUS
  Filled 2011-10-29: qty 2

## 2011-10-29 MED ORDER — HYDROMORPHONE HCL PF 1 MG/ML IJ SOLN
1.0000 mg | Freq: Once | INTRAMUSCULAR | Status: AC
  Administered 2011-10-29: 0.5 mg via INTRAVENOUS
  Filled 2011-10-29: qty 1

## 2011-10-29 MED ORDER — IOHEXOL 300 MG/ML  SOLN
100.0000 mL | Freq: Once | INTRAMUSCULAR | Status: AC | PRN
  Administered 2011-10-29: 100 mL via INTRAVENOUS

## 2011-10-29 NOTE — ED Notes (Signed)
Patient is speaking with slurred speech not being very responsive.

## 2011-10-29 NOTE — ED Notes (Signed)
Pt noted to be groggy, and slurring her speech. Dr. Terrilee Files notified and advised to decrease dose of dilauded to 0.5mg .
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
## 2011-10-29 NOTE — Discharge Instructions (Signed)
Rest at home for 2-3 days.  Follow up with your md next week.

## 2011-10-29 NOTE — ED Notes (Signed)
Pt seemed to be out of it, pt's speech is slurry, RN notified.

## 2011-10-29 NOTE — ED Notes (Addendum)
Pt very groggy, have to talk loud and call name twice for pt to become alert and talk. Advised edp and questioned dialudid order. Vo to given 0.5mg  dilaudid iv instead of 1mg . Read back and verified. Pt denies being on any narcotics. Pt slurring speech and pupils pinpoint but reactive. Pt oriented x 4 when alert
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
## 2012-05-14 ENCOUNTER — Emergency Department (HOSPITAL_COMMUNITY)
Admission: EM | Admit: 2012-05-14 | Discharge: 2012-05-14 | Disposition: A | Discharge status: Home / Self Care | Payer: Self-pay | Attending: Emergency Medicine | Admitting: Emergency Medicine

## 2012-05-14 ENCOUNTER — Encounter (HOSPITAL_COMMUNITY): Payer: Self-pay | Admitting: Emergency Medicine

## 2012-05-14 DIAGNOSIS — Z0389 Encounter for observation for other suspected diseases and conditions ruled out: Secondary | ICD-10-CM | POA: Insufficient documentation

## 2012-05-14 LAB — URINE MICROSCOPIC-ADD ON

## 2012-05-14 LAB — URINALYSIS, ROUTINE W REFLEX MICROSCOPIC
Nitrite: NEGATIVE
Specific Gravity, Urine: 1.025 (ref 1.005–1.030)
Urobilinogen, UA: 0.2 mg/dL (ref 0.0–1.0)
pH: 6 (ref 5.0–8.0)

## 2012-05-14 LAB — PREGNANCY, URINE: Preg Test, Ur: NEGATIVE

## 2012-05-14 NOTE — ED Notes (Signed)
Per Registration, Pt left AMA.

## 2012-05-14 NOTE — ED Notes (Signed)
Patient with multiple complaints. Right ear pain x 1 week, sore throat x3 days, lower back pain x 3 days with dysuria (burning and straining). Denies fever.

## 2012-05-15 ENCOUNTER — Emergency Department (HOSPITAL_COMMUNITY)
Admission: EM | Admit: 2012-05-15 | Discharge: 2012-05-15 | Disposition: A | Discharge status: Home / Self Care | Payer: Medicaid Other | Attending: Emergency Medicine | Admitting: Emergency Medicine

## 2012-05-15 ENCOUNTER — Encounter (HOSPITAL_COMMUNITY): Payer: Self-pay | Admitting: Emergency Medicine

## 2012-05-15 DIAGNOSIS — F17200 Nicotine dependence, unspecified, uncomplicated: Secondary | ICD-10-CM | POA: Insufficient documentation

## 2012-05-15 DIAGNOSIS — R300 Dysuria: Secondary | ICD-10-CM | POA: Insufficient documentation

## 2012-05-15 DIAGNOSIS — H9209 Otalgia, unspecified ear: Secondary | ICD-10-CM | POA: Insufficient documentation

## 2012-05-15 DIAGNOSIS — I10 Essential (primary) hypertension: Secondary | ICD-10-CM | POA: Insufficient documentation

## 2012-05-15 DIAGNOSIS — R070 Pain in throat: Secondary | ICD-10-CM | POA: Insufficient documentation

## 2012-05-15 LAB — URINALYSIS, ROUTINE W REFLEX MICROSCOPIC
Bilirubin Urine: NEGATIVE
Glucose, UA: NEGATIVE mg/dL
Hgb urine dipstick: NEGATIVE
Ketones, ur: NEGATIVE mg/dL
Protein, ur: NEGATIVE mg/dL
Urobilinogen, UA: 0.2 mg/dL (ref 0.0–1.0)

## 2012-05-15 MED ORDER — ANTIPYRINE-BENZOCAINE 5.4-1.4 % OT SOLN: 3.0000 [drp] | Freq: Four times a day (QID) | OTIC | Status: DC | PRN

## 2012-05-15 NOTE — ED Notes (Signed)
Patient complaining of sore throat, right earache, and burning on urination. States that earache started first then moved into entire right side of face. Complaining that she feels like she has a knot in her throat and as if she has a urinary tract infection.

## 2012-05-15 NOTE — ED Provider Notes (Signed)
History   This chart was scribed for Joya Gaskins, MD by Charolett Bumpers . The patient was seen in room [facility]. Patient's care was started at 0658.   CSN: [id number] Arrival date & time 05/15/12  2952  First MD Initiated Contact with Patient 05/15/12 [phone]      Chief Complaint  Patient presents with  . Otalgia  . Sore Throat  . Dysuria    The history is provided by the patient. No language interpreter was used.   Mary Jarvis is a 36 y.o. female who presents to the Emergency Department complaining of constant, moderate right sided otalgia that started 8 days ago. She states she now has an associated sore throat and had difficulty swallowing. She states her symptoms have gradually worsened. She denies any known injuries to the ear, but reports having a milky discharge from the ear. She states the pain is located mainly behind the ear. She denies any v/d, abdominal pain, vaginal bleeding. She also complains of dysuria and difficulty urinating. She reports the only abdominal surgery she has had is a tubal ligation.   Past Medical History  Diagnosis Date  . Hypertension   . Kidney stones     Past Surgical History  Procedure Date  . Rt foot surgery     History reviewed. No pertinent family history.  History  Substance Use Topics  . Smoking status: Current Every Day Smoker -- 1.0 packs/day    Types: Cigarettes  . Smokeless tobacco: Not on file  . Alcohol Use: No    OB History    Grav Para Term Preterm Abortions TAB SAB Ect Mult Living                  Review of Systems  HENT: Positive for ear pain, sore throat, trouble swallowing and ear discharge.   Gastrointestinal: Negative for vomiting, abdominal pain and diarrhea.  Genitourinary: Positive for dysuria and difficulty urinating. Negative for vaginal bleeding.  All other systems reviewed and are negative.    Allergies  Aspirin; Ibuprofen; and Tramadol  Home Medications   Current Outpatient Rx    Name Route Sig Dispense Refill  . ATENOLOL 25 MG PO TABS Oral Take 25 mg by mouth daily.    Marland Kitchen RANITIDINE HCL 150 MG PO TABS Oral Take 150 mg by mouth 2 (two) times daily.      BP 126/84  Pulse 96  Temp 97.7 F (36.5 C) (Oral)  Resp 20  Ht 5' (1.524 m)  Wt 162 lb (73.483 kg)  BMI 31.64 kg/m2  SpO2 96%  LMP 05/03/2012  Physical Exam CONSTITUTIONAL: Well developed/well nourished HEAD AND FACE: Normocephalic/atraumatic EYES: EOMI/PERRL ENMT: Mucous membranes moist,  uvula midline, pharynx normal, left TM and right TM normal appearance, no mastoid tenderness, no stridor, no cervical lymphadenopathy.  NECK: supple no meningeal signs SPINE:entire spine nontender CV: S1/S2 noted, no murmurs/rubs/gallops noted LUNGS: Lungs are clear to auscultation bilaterally, no apparent distress ABDOMEN: soft, nontender, no rebound or guarding GU:no cva tenderness NEURO: Pt is awake/alert, moves all extremitiesx4 EXTREMITIES: pulses normal, full ROM SKIN: warm, color normal PSYCH: no abnormalities of mood noted  ED Course  Procedures  DIAGNOSTIC STUDIES: Oxygen Saturation is 96% on room air, adequate by my interpretation.    COORDINATION OF CARE:  07:12-Discussed planned course of treatment with the patient including a UA, who is agreeable at this time.  Does not appear to OE or OM, TM is intact.  No distress.  Stable for  d/c  Labs Reviewed  URINALYSIS, ROUTINE W REFLEX MICROSCOPIC - Abnormal; Notable for the following:    APPearance HAZY (*)     Specific Gravity, Urine >1.030 (*)     All other components within normal limits  PREGNANCY, URINE    MDM  Nursing notes including past medical history and social history reviewed and considered in documentation Labs/vital reviewed and considered    I personally performed the services described in this documentation, which was scribed in my presence. The recorded information has been reviewed and considered.         Joya Gaskins, MD 05/15/12 [phone]

## 2012-05-18 ENCOUNTER — Emergency Department (HOSPITAL_COMMUNITY)
Admission: EM | Admit: 2012-05-18 | Discharge: 2012-05-18 | Disposition: A | Discharge status: Home / Self Care | Payer: Medicaid Other | Attending: Emergency Medicine | Admitting: Emergency Medicine

## 2012-05-18 ENCOUNTER — Encounter (HOSPITAL_COMMUNITY): Payer: Self-pay | Admitting: *Deleted

## 2012-05-18 ENCOUNTER — Emergency Department (HOSPITAL_COMMUNITY): Payer: Medicaid Other

## 2012-05-18 DIAGNOSIS — M5430 Sciatica, unspecified side: Secondary | ICD-10-CM | POA: Insufficient documentation

## 2012-05-18 DIAGNOSIS — Z79899 Other long term (current) drug therapy: Secondary | ICD-10-CM | POA: Insufficient documentation

## 2012-05-18 DIAGNOSIS — Z87441 Personal history of nephrotic syndrome: Secondary | ICD-10-CM | POA: Insufficient documentation

## 2012-05-18 DIAGNOSIS — I10 Essential (primary) hypertension: Secondary | ICD-10-CM | POA: Insufficient documentation

## 2012-05-18 DIAGNOSIS — F17200 Nicotine dependence, unspecified, uncomplicated: Secondary | ICD-10-CM | POA: Insufficient documentation

## 2012-05-18 DIAGNOSIS — M5431 Sciatica, right side: Secondary | ICD-10-CM

## 2012-05-18 DIAGNOSIS — Z8711 Personal history of peptic ulcer disease: Secondary | ICD-10-CM | POA: Insufficient documentation

## 2012-05-18 HISTORY — DX: Gastric ulcer, unspecified as acute or chronic, without hemorrhage or perforation: K25.9

## 2012-05-18 MED ORDER — HYDROMORPHONE HCL PF 1 MG/ML IJ SOLN
1.0000 mg | Freq: Once | INTRAMUSCULAR | Status: AC
  Administered 2012-05-18: 1 mg via INTRAMUSCULAR
  Filled 2012-05-18: qty 1

## 2012-05-18 MED ORDER — HYDROCODONE-ACETAMINOPHEN 5-325 MG PO TABS: 1.0000 | ORAL_TABLET | ORAL | Status: AC | PRN

## 2012-05-18 MED ORDER — METHOCARBAMOL 500 MG PO TABS: 1000.0000 mg | ORAL_TABLET | Freq: Four times a day (QID) | ORAL | Status: AC

## 2012-05-18 NOTE — ED Notes (Signed)
Back pain after  Helping lift a pt Pain lower back into rt.hip.
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
## 2012-05-18 NOTE — ED Provider Notes (Signed)
History     CSN: [id number]  Arrival date & time 05/18/12  1059   First MD Initiated Contact with Patient 05/18/12 1112      Chief Complaint  Patient presents with  . 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  She describes sudden onset of right lower pain which radiates through her buttock to her right posterior lower thigh.     There has been no weakness or numbness in the lower extremities and no urinary or bowel retention or incontinence.  Patient does not have a history of cancer or IVDU.  She applied heat and took tylenol which has not improved her pain.   The history is provided by the patient.    Past Medical History  Diagnosis Date  . Hypertension   . Kidney stones   . Gastric ulcer     Past Surgical History  Procedure Date  . Rt foot surgery   . Foot surgery   . Tubal ligation     History reviewed. No pertinent family history.  History  Substance Use Topics  . Smoking status: Current Every Day Smoker -- 1.0 packs/day    Types: Cigarettes  . Smokeless tobacco: Not on file  . Alcohol Use: No    OB History    Grav Para Term Preterm Abortions TAB SAB Ect Mult Living                  Review of Systems  Constitutional: Negative for fever.  Respiratory: Negative for shortness of breath.   Cardiovascular: Negative for chest pain and leg swelling.  Gastrointestinal: Negative for abdominal pain, constipation and abdominal distention.  Genitourinary: Negative for dysuria, urgency, frequency, flank pain and difficulty urinating.  Musculoskeletal: Positive for back pain. Negative for joint swelling and gait problem.  Skin: Negative for rash.  Neurological: Negative for weakness and numbness.    Allergies   Aspirin; Ibuprofen; and Tramadol  Home Medications   Current Outpatient Rx  Name Route Sig Dispense Refill  . ANTIPYRINE-BENZOCAINE 5.4-1.4 % OT SOLN Right Ear Place 3 drops into the right ear 4 (four) times daily as needed for pain. 10 mL 0  . ATENOLOL 25 MG PO TABS Oral Take 25 mg by mouth daily.    Marland Kitchen HYDROCODONE-ACETAMINOPHEN 5-325 MG PO TABS Oral Take 1 tablet by mouth every 4 (four) hours as needed for pain. 15 tablet 0  . METHOCARBAMOL 500 MG PO TABS Oral Take 2 tablets (1,000 mg total) by mouth 4 (four) times daily. 40 tablet 0  . RANITIDINE HCL 150 MG PO TABS Oral Take 150 mg by mouth 2 (two) times daily.      BP 119/73  Pulse 107  Temp 97.9 F (36.6 C) (Oral)  Resp 18  Ht 5' (1.524 m)  Wt 167 lb (75.751 kg)  BMI 32.62 kg/m2  SpO2 99%  LMP 05/03/2012  Physical Exam  Nursing note and vitals reviewed. Constitutional: She appears well-developed and well-nourished.  HENT:  Head: Normocephalic.  Eyes: Conjunctivae normal are normal.  Neck: Normal range of motion. Neck supple.  Cardiovascular: Normal rate and intact distal pulses.        Pedal pulses normal.  Pulmonary/Chest: Effort normal.  Abdominal: Soft. Bowel sounds are normal. She exhibits no distension and no mass.  Musculoskeletal: Normal  range of motion. She exhibits no edema.       Lumbar back: She exhibits tenderness. She exhibits no swelling, no edema and no spasm.  Neurological: She is alert. She has normal strength. She displays no atrophy and no tremor. No sensory deficit. Gait normal.  Reflex Scores:      Patellar reflexes are 2+ on the right side and 2+ on the left side.      Achilles reflexes are 2+ on the right side and 2+ on the left side.      No strength deficit noted in hip and knee flexor and extensor muscle groups.  Ankle flexion and extension intact.  Skin: Skin is warm and dry.  Psychiatric: She has a normal mood and affect.    ED Course  Procedures (including critical care time)  Labs  Reviewed - No data to display Dg Lumbar Spine Complete  05/18/2012  *RADIOLOGY REPORT*  Clinical Data: Low back pain after lifting.  The right leg pain.  LUMBAR SPINE - COMPLETE 4+ VIEW  Comparison: 10/29/2011 CT of the abdomen and pelvis.  Findings: No fracture or dislocation.  Normal alignment.  Minimal L4-5 disc space narrowing.  IMPRESSION: Minimal L4-5 disc space narrowing.   Original Report Authenticated By: Fuller Canada, M.D.      1. Sciatica of right side       MDM  X-rays reviewed and discussed with patient prior to discharge home.  Dilaudid 1 mg IM given during ED visit and pain.  Prescribed hydrocodone and Robaxin for home use.  Encouraged ice care through tomorrow, then may add heat on day 3.  Rest.  Recheck by PCP if not improved or for worsened symptoms.  No neuro deficit on exam or by history to suggest emergent or surgical presentation.  Also discussed worsened sx that should prompt immediate re-evaluation including distal weakness, bowel/bladder retention/incontinence.              Burgess Amor, PA 05/18/12 1314

## 2012-05-18 NOTE — ED Provider Notes (Signed)
Medical screening examination/treatment/procedure(s) were performed by non-physician practitioner and as supervising physician I was immediately available for consultation/collaboration.   Signe Tackitt M Sarath Privott, DO 05/18/12 2028 

## 2012-08-04 ENCOUNTER — Encounter (HOSPITAL_COMMUNITY): Payer: Self-pay | Admitting: *Deleted

## 2012-08-04 ENCOUNTER — Emergency Department (HOSPITAL_COMMUNITY)
Admission: EM | Admit: 2012-08-04 | Discharge: 2012-08-04 | Disposition: A | Discharge status: Home / Self Care | Payer: No Typology Code available for payment source | Attending: Emergency Medicine | Admitting: Emergency Medicine

## 2012-08-04 DIAGNOSIS — I10 Essential (primary) hypertension: Secondary | ICD-10-CM | POA: Insufficient documentation

## 2012-08-04 DIAGNOSIS — Z79899 Other long term (current) drug therapy: Secondary | ICD-10-CM | POA: Insufficient documentation

## 2012-08-04 DIAGNOSIS — Y9389 Activity, other specified: Secondary | ICD-10-CM | POA: Insufficient documentation

## 2012-08-04 DIAGNOSIS — S40019A Contusion of unspecified shoulder, initial encounter: Secondary | ICD-10-CM | POA: Insufficient documentation

## 2012-08-04 DIAGNOSIS — F17200 Nicotine dependence, unspecified, uncomplicated: Secondary | ICD-10-CM | POA: Insufficient documentation

## 2012-08-04 DIAGNOSIS — K259 Gastric ulcer, unspecified as acute or chronic, without hemorrhage or perforation: Secondary | ICD-10-CM | POA: Insufficient documentation

## 2012-08-04 DIAGNOSIS — Z87442 Personal history of urinary calculi: Secondary | ICD-10-CM | POA: Insufficient documentation

## 2012-08-04 DIAGNOSIS — Z8619 Personal history of other infectious and parasitic diseases: Secondary | ICD-10-CM | POA: Insufficient documentation

## 2012-08-04 HISTORY — DX: Unspecified viral hepatitis C without hepatic coma: B19.20

## 2012-08-04 MED ORDER — OXYCODONE HCL 5 MG PO CAPS: ORAL_CAPSULE | ORAL | Status: DC

## 2012-08-04 MED ORDER — OXYCODONE HCL 5 MG PO TABS
5.0000 mg | ORAL_TABLET | Freq: Once | ORAL | Status: AC
  Administered 2012-08-04: 5 mg via ORAL
  Filled 2012-08-04: qty 1
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
## 2012-08-06 NOTE — ED Provider Notes (Signed)
Medical screening examination/treatment/procedure(s) were performed by non-physician practitioner and as supervising physician I was immediately available for consultation/collaboration. Reily Ilic, MD, FACEP   Avigail Pilling L Eulamae Greenstein, MD 08/06/12 0706 

## 2012-09-19 ENCOUNTER — Encounter (HOSPITAL_COMMUNITY): Payer: Self-pay | Admitting: *Deleted

## 2012-09-19 ENCOUNTER — Emergency Department (HOSPITAL_COMMUNITY)
Admission: EM | Admit: 2012-09-19 | Discharge: 2012-09-19 | Disposition: A | Discharge status: Home / Self Care | Payer: Medicaid Other | Attending: Emergency Medicine | Admitting: Emergency Medicine

## 2012-09-19 ENCOUNTER — Emergency Department (HOSPITAL_COMMUNITY): Payer: Medicaid Other

## 2012-09-19 DIAGNOSIS — R1011 Right upper quadrant pain: Secondary | ICD-10-CM | POA: Insufficient documentation

## 2012-09-19 DIAGNOSIS — R141 Gas pain: Secondary | ICD-10-CM | POA: Insufficient documentation

## 2012-09-19 DIAGNOSIS — Z3202 Encounter for pregnancy test, result negative: Secondary | ICD-10-CM | POA: Insufficient documentation

## 2012-09-19 DIAGNOSIS — Z8619 Personal history of other infectious and parasitic diseases: Secondary | ICD-10-CM | POA: Insufficient documentation

## 2012-09-19 DIAGNOSIS — R109 Unspecified abdominal pain: Secondary | ICD-10-CM

## 2012-09-19 DIAGNOSIS — K921 Melena: Secondary | ICD-10-CM | POA: Insufficient documentation

## 2012-09-19 DIAGNOSIS — Z79899 Other long term (current) drug therapy: Secondary | ICD-10-CM | POA: Insufficient documentation

## 2012-09-19 DIAGNOSIS — R6883 Chills (without fever): Secondary | ICD-10-CM | POA: Insufficient documentation

## 2012-09-19 DIAGNOSIS — Z8711 Personal history of peptic ulcer disease: Secondary | ICD-10-CM | POA: Insufficient documentation

## 2012-09-19 DIAGNOSIS — R059 Cough, unspecified: Secondary | ICD-10-CM | POA: Insufficient documentation

## 2012-09-19 DIAGNOSIS — I10 Essential (primary) hypertension: Secondary | ICD-10-CM | POA: Insufficient documentation

## 2012-09-19 DIAGNOSIS — R143 Flatulence: Secondary | ICD-10-CM | POA: Insufficient documentation

## 2012-09-19 DIAGNOSIS — K920 Hematemesis: Secondary | ICD-10-CM | POA: Insufficient documentation

## 2012-09-19 DIAGNOSIS — R05 Cough: Secondary | ICD-10-CM | POA: Insufficient documentation

## 2012-09-19 DIAGNOSIS — F17200 Nicotine dependence, unspecified, uncomplicated: Secondary | ICD-10-CM | POA: Insufficient documentation

## 2012-09-19 DIAGNOSIS — Z87442 Personal history of urinary calculi: Secondary | ICD-10-CM | POA: Insufficient documentation

## 2012-09-19 DIAGNOSIS — R112 Nausea with vomiting, unspecified: Secondary | ICD-10-CM

## 2012-09-19 DIAGNOSIS — R142 Eructation: Secondary | ICD-10-CM | POA: Insufficient documentation

## 2012-09-19 DIAGNOSIS — Z9851 Tubal ligation status: Secondary | ICD-10-CM | POA: Insufficient documentation

## 2012-09-19 LAB — URINALYSIS, ROUTINE W REFLEX MICROSCOPIC
Glucose, UA: NEGATIVE mg/dL
Ketones, ur: NEGATIVE mg/dL
Nitrite: NEGATIVE
Protein, ur: NEGATIVE mg/dL
Urobilinogen, UA: 0.2 mg/dL (ref 0.0–1.0)

## 2012-09-19 LAB — PREGNANCY, URINE: Preg Test, Ur: NEGATIVE

## 2012-09-19 LAB — CBC WITH DIFFERENTIAL/PLATELET
Basophils Absolute: 0 10*3/uL (ref 0.0–0.1)
Basophils Relative: 0 % (ref 0–1)
Hemoglobin: 15.5 g/dL — ABNORMAL HIGH (ref 12.0–15.0)
MCHC: 34 g/dL (ref 30.0–36.0)
Monocytes Relative: 3 % (ref 3–12)
Neutro Abs: 12.7 10*3/uL — ABNORMAL HIGH (ref 1.7–7.7)
Neutrophils Relative %: 79 % — ABNORMAL HIGH (ref 43–77)
Platelets: 294 10*3/uL (ref 150–400)

## 2012-09-19 LAB — COMPREHENSIVE METABOLIC PANEL
ALT: 35 U/L (ref 0–35)
CO2: 22 mEq/L (ref 19–32)
Calcium: 9.9 mg/dL (ref 8.4–10.5)
Chloride: 103 mEq/L (ref 96–112)
Creatinine, Ser: 0.73 mg/dL (ref 0.50–1.10)
GFR calc Af Amer: >90 mL/min (ref >90)
GFR calc non Af Amer: >90 mL/min (ref >90)
Glucose, Bld: 112 mg/dL — ABNORMAL HIGH (ref 70–99)
Sodium: 137 mEq/L (ref 135–145)
Total Bilirubin: 0.8 mg/dL (ref 0.3–1.2)

## 2012-09-19 LAB — URINE MICROSCOPIC-ADD ON

## 2012-09-19 MED ORDER — IOHEXOL 300 MG/ML  SOLN
50.0000 mL | Freq: Once | INTRAMUSCULAR | Status: AC | PRN
  Administered 2012-09-19: 50 mL via ORAL

## 2012-09-19 MED ORDER — PROMETHAZINE HCL 25 MG/ML IJ SOLN
12.5000 mg | Freq: Once | INTRAMUSCULAR | Status: AC
  Administered 2012-09-19: 12.5 mg via INTRAVENOUS
  Filled 2012-09-19: qty 1

## 2012-09-19 MED ORDER — OXYCODONE HCL 5 MG PO TABS: 5.0000 mg | ORAL_TABLET | Freq: Four times a day (QID) | ORAL | Status: DC | PRN

## 2012-09-19 MED ORDER — IOHEXOL 300 MG/ML  SOLN
100.0000 mL | Freq: Once | INTRAMUSCULAR | Status: AC | PRN
  Administered 2012-09-19: 100 mL via INTRAVENOUS

## 2012-09-19 MED ORDER — HYDROMORPHONE HCL PF 1 MG/ML IJ SOLN
0.5000 mg | Freq: Once | INTRAMUSCULAR | Status: AC
  Administered 2012-09-19: 0.5 mg via INTRAVENOUS
  Filled 2012-09-19: qty 1

## 2012-09-19 MED ORDER — PROMETHAZINE HCL 25 MG PO TABS: 25.0000 mg | ORAL_TABLET | Freq: Four times a day (QID) | ORAL | Status: DC | PRN

## 2012-09-19 NOTE — ED Provider Notes (Signed)
Medical screening examination/treatment/procedure(s) were performed by non-physician practitioner and as supervising physician I was immediately available for consultation/collaboration.   Laray Anger, DO 09/19/12 1924

## 2012-09-19 NOTE — ED Notes (Signed)
Sprite zero given 

## 2012-09-19 NOTE — ED Notes (Signed)
C/o n/v, RUQ pain, cough x 3 days.  Reports recently dx with hepatitis C, and has appt with specialist re: liver disease.

## 2012-09-19 NOTE — ED Provider Notes (Signed)
History     CSN: [id number]  Arrival date & time 09/19/12  4098   First MD Initiated Contact with Patient 09/19/12 1020      Chief Complaint  Patient presents with  . Abdominal Pain    (Consider location/radiation/quality/duration/timing/severity/associated sxs/prior treatment) Patient is a 37 y.o. female presenting with abdominal pain. The history is provided by the patient.  Abdominal Pain Pain location:  RUQ Pain quality: cramping and sharp   Pain radiates to:  Does not radiate Pain severity:  Severe Onset quality:  Sudden Timing:  Constant Progression:  Worsening Chronicity:  Recurrent Context: recent illness   Context: not alcohol use, not laxative use, not previous surgeries and not recent travel   Relieved by:  Nothing Worsened by:  Nothing tried Ineffective treatments:  None tried Associated symptoms: belching, chills, cough, hematemesis, hematochezia and nausea   Associated symptoms: no chest pain, no constipation, no dysuria, no fever, no flatus, no hematuria and no shortness of breath   Risk factors: no alcohol abuse, no aspirin use and no NSAID use     Past Medical History  Diagnosis Date  . Hypertension   . Gastric ulcer   . Kidney stones   . Hepatitis C     Past Surgical History  Procedure Laterality Date  . Rt foot surgery    . Foot surgery    . Tubal ligation      No family history on file.  History  Substance Use Topics  . Smoking status: Current Every Day Smoker -- 1.00 packs/day    Types: Cigarettes  . Smokeless tobacco: Not on file  . Alcohol Use: No    OB History   Grav Para Term Preterm Abortions TAB SAB Ect Mult Living                  Review of Systems  Constitutional: Positive for chills. Negative for fever and activity change.       All ROS Neg except as noted in HPI  HENT: Negative for nosebleeds and neck pain.   Eyes: Negative for photophobia and discharge.  Respiratory: Positive for cough. Negative for shortness of  breath and wheezing.   Cardiovascular: Negative for chest pain and palpitations.  Gastrointestinal: Positive for nausea, abdominal pain, hematochezia and hematemesis. Negative for constipation, blood in stool and flatus.  Genitourinary: Negative for dysuria, frequency and hematuria.  Musculoskeletal: Negative for back pain and arthralgias.  Skin: Negative.   Neurological: Negative for dizziness, seizures and speech difficulty.  Psychiatric/Behavioral: Negative for hallucinations and confusion.    Allergies  Aspirin; Ibuprofen; Tramadol; and Tylenol  Home Medications   Current Outpatient Rx  Name  Route  Sig  Dispense  Refill  . antipyrine-benzocaine (AURALGAN) otic solution   Right Ear   Place 3 drops into the right ear 4 (four) times daily as needed for pain.   10 mL   0   . atenolol (TENORMIN) 25 MG tablet   Oral   Take 25 mg by mouth daily.         Marland Kitchen oxycodone (OXY-IR) 5 MG capsule      One tab po q 6 hrs prn pain   12 capsule   0   . ranitidine (ZANTAC) 150 MG tablet   Oral   Take 150 mg by mouth 2 (two) times daily.           BP 145/94  Pulse 128  Temp(Src) 97.7 F (36.5 C) (Oral)  Resp 20  Ht  5' (1.524 m)  Wt 197 lb (89.359 kg)  BMI 38.47 kg/m2  SpO2 97%  LMP 08/29/2012  Physical Exam  Nursing note and vitals reviewed. Constitutional: She is oriented to person, place, and time. She appears well-developed and well-nourished.  Non-toxic appearance.  HENT:  Head: Normocephalic.  Right Ear: Tympanic membrane and external ear normal.  Left Ear: Tympanic membrane and external ear normal.  Eyes: EOM and lids are normal. Pupils are equal, round, and reactive to light.  Neck: Normal range of motion. Neck supple. Carotid bruit is not present.  Cardiovascular: Regular rhythm, normal heart sounds, intact distal pulses and normal pulses.  Tachycardia present.   Pulmonary/Chest: Breath sounds normal. No respiratory distress.  Abdomen is soft with good bowel  sounds. Is no mass or organ enlargement appreciated by percussion. There is pain to palpation in the right mid abdomen, right lower abdomen, and the periumbilical area. There is no rebound. No left CVA tenderness. There is question of right CVA tenderness, as the patient states that the examination makes her right lower anterior abdomen hurt.  Abdominal: Soft. Bowel sounds are normal. There is no tenderness. There is no guarding.  Musculoskeletal: Normal range of motion.  Lymphadenopathy:       Head (right side): No submandibular adenopathy present.       Head (left side): No submandibular adenopathy present.    She has no cervical adenopathy.  Neurological: She is alert and oriented to person, place, and time. She has normal strength. No cranial nerve deficit or sensory deficit.  Skin: Skin is warm and dry.  Psychiatric: She has a normal mood and affect. Her speech is normal.    ED Course  Procedures (including critical care time)  Labs Reviewed  COMPREHENSIVE METABOLIC PANEL  CBC WITH DIFFERENTIAL  PREGNANCY, URINE  URINALYSIS, ROUTINE W REFLEX MICROSCOPIC   No results found.   No diagnosis found.    MDM  I have reviewed nursing notes, vital signs, and all appropriate lab and imaging results for this patient. The patient presented to the emergency department with complaint of cough, right upper quadrant pain, nausea vomiting for 3 days. Patient states she has been recently diagnosed with hepatitis see and is scheduled to see a specialist next week. The patient states she had some fatty meals on February 20 and 21 and after this she began to have pain of her right lower abdomen. It is of note that the patient has history of" stomach ulcers".  Lipase is normal at 22. Comprehensive metabolic panel is normal with exception of glucose being slightly elevated at 112. Complete blood count shows an elevation in the white blood cells of 16,000, hemoglobin slightly low at 15.5, hematocrit  within normal limits at 45.6. There is no bands present or shift to the left. The pregnancy test is negative. The urine analysis shows no acute changes. CT scan of the abdomen and pelvis with contrast shows no acute findings in the abdomen or pelvis.  I have discussed all the findings of the physical examination as well as the laboratory and x-ray findings with the patient. I've offered the patient the opportunity to perform a chest x-ray and a pelvic ultrasound as the testing so far has not revealed the cause of her pain or any of the other abnormal findings at this time. The patient states that she would like to followup with her primary care physician. She does not desire any additional testing at this time. She states that she is much  more comfortable after receiving pain medication and she will desires to return home and followup as an outpatient with her physician as well as with the GI specialist that she has been a sign.  Prescription for promethazine 25 mg one every 6 hours and Roxicodone one every 6 hours as needed for pain #15 given to the patient. Patient invited to return if any changes, problems, or concerns.       Kathie Dike, [place] 09/19/12 [phone]

## 2012-11-02 ENCOUNTER — Other Ambulatory Visit: Payer: Self-pay | Admitting: Nurse Practitioner

## 2012-11-03 NOTE — Telephone Encounter (Signed)
PLEASE PRINT AND NOTIFY PATIENT WHEN READY. LAST SEEN BY TAMMY IN PAIN CLINIC 3/14

## 2012-11-04 ENCOUNTER — Telehealth: Payer: Self-pay | Admitting: *Deleted

## 2012-11-04 ENCOUNTER — Telehealth: Payer: Self-pay | Admitting: Nurse Practitioner

## 2012-11-04 NOTE — Telephone Encounter (Signed)
Tramodol rx at front,  lmom for pt.

## 2012-11-18 ENCOUNTER — Ambulatory Visit (INDEPENDENT_AMBULATORY_CARE_PROVIDER_SITE_OTHER): Payer: Medicaid Other | Admitting: Family Medicine

## 2012-11-18 DIAGNOSIS — Z23 Encounter for immunization: Secondary | ICD-10-CM

## 2012-11-18 NOTE — Progress Notes (Signed)
Patient ID: Mary Jarvis, female   DOB: May 17, 1976, 37 y.o.   MRN: [id number] Patient given twinrix and tolerated well

## 2012-11-28 NOTE — Telephone Encounter (Signed)
App made 

## 2012-11-29 ENCOUNTER — Telehealth: Payer: Self-pay | Admitting: General Practice

## 2012-11-29 ENCOUNTER — Encounter: Payer: Self-pay | Admitting: General Practice

## 2012-11-29 ENCOUNTER — Ambulatory Visit (INDEPENDENT_AMBULATORY_CARE_PROVIDER_SITE_OTHER): Payer: Medicaid Other | Admitting: General Practice

## 2012-11-29 VITALS — BP 134/88 | HR 119 | Temp 97.6°F | Ht 62.0 in | Wt 200.0 lb

## 2012-11-29 DIAGNOSIS — R110 Nausea: Secondary | ICD-10-CM

## 2012-11-29 DIAGNOSIS — R040 Epistaxis: Secondary | ICD-10-CM

## 2012-11-29 LAB — POCT CBC
Granulocyte percent: 58.8 %G (ref 37–80)
HCT, POC: 43.7 % (ref 37.7–47.9)
Hemoglobin: 14 g/dL (ref 12.2–16.2)
MPV: 8.2 fL (ref 0–99.8)
POC Granulocyte: 6.8 (ref 2–6.9)
POC LYMPH PERCENT: 36.3 %L (ref 10–50)
RBC: 5 M/uL (ref 4.04–5.48)

## 2012-11-29 MED ORDER — ONDANSETRON HCL 4 MG PO TABS: 4.0000 mg | ORAL_TABLET | Freq: Three times a day (TID) | ORAL | Status: DC | PRN

## 2012-11-29 NOTE — Progress Notes (Signed)
  Subjective:    Patient ID: Mary Jarvis, female    DOB: March 31, 1976, 37 y.o.   MRN: [id number]  HPI Reports today with for nosebleed for last three days. Onset was sudden and lasted for 2 minutes. OTC medication not tried. Denies known allergies. Reports being seen by hepatologist in [facility] and appointment scheduled on June 11. Denies nose bleeds today. Denies dizziness or known trauma. Complains of generalized chronic pain and reports being seen at pain management clinic.     Review of Systems  Constitutional: Negative for fever and chills.  HENT: Positive for nosebleeds. Negative for rhinorrhea, sneezing, postnasal drip, sinus pressure and ear discharge.   Eyes: Negative for visual disturbance.  Respiratory: Negative for cough, chest tightness and shortness of breath.   Cardiovascular: Negative for chest pain.  Genitourinary: Negative for difficulty urinating.  Skin: Negative.   Neurological: Negative for dizziness, syncope, weakness and headaches.       Objective:   Physical Exam  Constitutional: She is oriented to person, place, and time. She appears well-developed and well-nourished.  HENT:  Head: Normocephalic and atraumatic.  Right Ear: External ear normal.  Left Ear: External ear normal.  Nose: No mucosal edema, sinus tenderness, nasal deformity or septal deviation. No epistaxis. Right sinus exhibits no maxillary sinus tenderness and no frontal sinus tenderness. Left sinus exhibits no maxillary sinus tenderness and no frontal sinus tenderness.  Mouth/Throat: Mucous membranes are normal.  Cardiovascular: Normal rate, regular rhythm and normal heart sounds.   Pulmonary/Chest: Effort normal and breath sounds normal.  Neurological: She is alert and oriented to person, place, and time.  Skin: Skin is warm and dry.  Psychiatric: She has a normal mood and affect.          Assessment & Plan:  Take medications as prescribed Labs pending (CBC) Continue appointment to pain  management clinic Keep appointment with hepatologist Continue stress management and antianxiety techniques Patient verbalized understanding Raymon Mutton, FNP-C

## 2012-11-29 NOTE — Patient Instructions (Addendum)
Nosebleed Nosebleeds can be caused by many conditions including trauma, infections, polyps, foreign bodies, dry mucous membranes or climate, medications and air conditioning. Most nosebleeds occur in the front of the nose. It is because of this location that most nosebleeds can be controlled by pinching the nostrils gently and continuously. Do this for at least 10 to 20 minutes. The reason for this long continuous pressure is that you must hold it long enough for the blood to clot. If during that 10 to 20 minute time period, pressure is released, the process may have to be started again. The nosebleed may stop by itself, quit with pressure, need concentrated heating (cautery) or stop with pressure from packing. HOME CARE INSTRUCTIONS   If your nose was packed, try to maintain the pack inside until your caregiver removes it. If a gauze pack was used and it starts to fall out, gently replace or cut the end off. Do not cut if a balloon catheter was used to pack the nose. Otherwise, do not remove unless instructed.  Avoid blowing your nose for 12 hours after treatment. This could dislodge the pack or clot and start bleeding again.  If the bleeding starts again, sit up and bending forward, gently pinch the front half of your nose continuously for 20 minutes.  If bleeding was caused by dry mucous membranes, cover the inside of your nose every morning with a petroleum or antibiotic ointment. Use your little fingertip as an applicator. Do this as needed during dry weather. This will keep the mucous membranes moist and allow them to heal.  Maintain humidity in your home by using less air conditioning or using a humidifier.  Do not use aspirin or medications which make bleeding more likely. Your caregiver can give you recommendations on this.  Resume normal activities as able but try to avoid straining, lifting or bending at the waist for several days.  If the nosebleeds become recurrent and the cause is  unknown, your caregiver may suggest laboratory tests. SEEK IMMEDIATE MEDICAL CARE IF:   Bleeding recurs and cannot be controlled.  There is unusual bleeding from or bruising on other parts of the body.  You have a fever.  Nosebleeds continue.  There is any worsening of the condition which originally brought you in.  You become lightheaded, feel faint, become sweaty or vomit blood. MAKE SURE YOU:   Understand these instructions.  Will watch your condition.  Will get help right away if you are not doing well or get worse. Document Released: 04/22/2005 Document Revised: 10/05/2011 Document Reviewed: 06/14/2009 [facility] Patient Information 2013 [place], [place]. Anxiety and Panic Attacks Your caregiver has informed you that you are having an anxiety or panic attack. There may be many forms of this. Most of the time these attacks come suddenly and without warning. They come at any time of day, including periods of sleep, and at any time of life. They may be strong and unexplained. Although panic attacks are very scary, they are physically harmless. Sometimes the cause of your anxiety is not known. Anxiety is a protective mechanism of the body in its fight or flight mechanism. Most of these perceived danger situations are actually nonphysical situations (such as anxiety over losing a job). CAUSES  The causes of an anxiety or panic attack are many. Panic attacks may occur in otherwise healthy people given a certain set of circumstances. There may be a genetic cause for panic attacks. Some medications may also have anxiety as a side effect.  SYMPTOMS  Some of the most common feelings are:  Intense terror.  Dizziness, feeling faint.  Hot and cold flashes.  Fear of going crazy.  Feelings that nothing is real.  Sweating.  Shaking.  Chest pain or a fast heartbeat (palpitations).  Smothering, choking sensations.  Feelings of impending doom and that death is near.  Tingling of  extremities, this may be from over-breathing.  Altered reality (derealization).  Being detached from yourself (depersonalization). Several symptoms can be present to make up anxiety or panic attacks. DIAGNOSIS  The evaluation by your caregiver will depend on the type of symptoms you are experiencing. The diagnosis of anxiety or panic attack is made when no physical illness can be determined to be a cause of the symptoms. TREATMENT  Treatment to prevent anxiety and panic attacks may include:  Avoidance of circumstances that cause anxiety.  Reassurance and relaxation.  Regular exercise.  Relaxation therapies, such as yoga.  Psychotherapy with a psychiatrist or therapist.  Avoidance of caffeine, alcohol and illegal drugs.  Prescribed medication. SEEK IMMEDIATE MEDICAL CARE IF:   You experience panic attack symptoms that are different than your usual symptoms.  You have any worsening or concerning symptoms. Document Released: 07/13/2005 Document Revised: 10/05/2011 Document Reviewed: 11/14/2009 [facility] Patient Information 2013 [place], [place].

## 2012-11-30 ENCOUNTER — Other Ambulatory Visit: Payer: Self-pay | Admitting: Nurse Practitioner

## 2012-11-30 NOTE — Telephone Encounter (Signed)
Please advise 

## 2012-11-30 NOTE — Telephone Encounter (Signed)
Please advise he xanax and tramadol refills

## 2012-12-01 ENCOUNTER — Other Ambulatory Visit: Payer: Self-pay | Admitting: General Practice

## 2012-12-01 DIAGNOSIS — F411 Generalized anxiety disorder: Secondary | ICD-10-CM

## 2012-12-01 MED ORDER — ALPRAZOLAM 0.25 MG PO TABS: 0.2500 mg | ORAL_TABLET | Freq: Two times a day (BID) | ORAL | Status: DC

## 2012-12-01 NOTE — Telephone Encounter (Signed)
Patient would like a call back today on status. She states that when she saw Mae she discussed the medication with her and it being out or med and Mae said that she would call those in for her.

## 2012-12-02 ENCOUNTER — Other Ambulatory Visit: Payer: Self-pay | Admitting: General Practice

## 2012-12-02 MED ORDER — ALPRAZOLAM 0.25 MG PO TABS: 0.2500 mg | ORAL_TABLET | Freq: Two times a day (BID) | ORAL | Status: DC

## 2012-12-02 NOTE — Telephone Encounter (Signed)
Pt given RX of Xanax per M. [place].  Other meds should be filled by pain management clinic.

## 2012-12-02 NOTE — Telephone Encounter (Signed)
Last seen 11/29/12 by you, last filled 11/02/12. If approved rx will print, have nurse to call pt to pickup

## 2012-12-02 NOTE — Progress Notes (Signed)
pT AWARE

## 2012-12-08 ENCOUNTER — Emergency Department (HOSPITAL_COMMUNITY)
Admission: EM | Admit: 2012-12-08 | Discharge: 2012-12-08 | Disposition: A | Discharge status: Home / Self Care | Payer: Medicaid Other | Attending: Emergency Medicine | Admitting: Emergency Medicine

## 2012-12-08 DIAGNOSIS — H9210 Otorrhea, unspecified ear: Secondary | ICD-10-CM | POA: Insufficient documentation

## 2012-12-08 DIAGNOSIS — I10 Essential (primary) hypertension: Secondary | ICD-10-CM | POA: Insufficient documentation

## 2012-12-08 DIAGNOSIS — H9222 Otorrhagia, left ear: Secondary | ICD-10-CM

## 2012-12-08 DIAGNOSIS — Z8711 Personal history of peptic ulcer disease: Secondary | ICD-10-CM | POA: Insufficient documentation

## 2012-12-08 DIAGNOSIS — E785 Hyperlipidemia, unspecified: Secondary | ICD-10-CM | POA: Insufficient documentation

## 2012-12-08 DIAGNOSIS — Z8619 Personal history of other infectious and parasitic diseases: Secondary | ICD-10-CM | POA: Insufficient documentation

## 2012-12-08 DIAGNOSIS — R142 Eructation: Secondary | ICD-10-CM | POA: Insufficient documentation

## 2012-12-08 DIAGNOSIS — R143 Flatulence: Secondary | ICD-10-CM | POA: Insufficient documentation

## 2012-12-08 DIAGNOSIS — Z3202 Encounter for pregnancy test, result negative: Secondary | ICD-10-CM | POA: Insufficient documentation

## 2012-12-08 DIAGNOSIS — R141 Gas pain: Secondary | ICD-10-CM | POA: Insufficient documentation

## 2012-12-08 DIAGNOSIS — Z87442 Personal history of urinary calculi: Secondary | ICD-10-CM | POA: Insufficient documentation

## 2012-12-08 DIAGNOSIS — R635 Abnormal weight gain: Secondary | ICD-10-CM | POA: Insufficient documentation

## 2012-12-08 DIAGNOSIS — Z79899 Other long term (current) drug therapy: Secondary | ICD-10-CM | POA: Insufficient documentation

## 2012-12-08 DIAGNOSIS — R109 Unspecified abdominal pain: Secondary | ICD-10-CM

## 2012-12-08 DIAGNOSIS — R1011 Right upper quadrant pain: Secondary | ICD-10-CM | POA: Insufficient documentation

## 2012-12-08 DIAGNOSIS — F17200 Nicotine dependence, unspecified, uncomplicated: Secondary | ICD-10-CM | POA: Insufficient documentation

## 2012-12-08 LAB — COMPREHENSIVE METABOLIC PANEL
ALT: 48 U/L — ABNORMAL HIGH (ref 0–35)
AST: 32 U/L (ref 0–37)
CO2: 25 mEq/L (ref 19–32)
Calcium: 9.4 mg/dL (ref 8.4–10.5)
Chloride: 105 mEq/L (ref 96–112)
GFR calc Af Amer: >90 mL/min (ref >90)
GFR calc non Af Amer: >90 mL/min (ref >90)
Glucose, Bld: 104 mg/dL — ABNORMAL HIGH (ref 70–99)
Sodium: 140 mEq/L (ref 135–145)
Total Bilirubin: 0.3 mg/dL (ref 0.3–1.2)

## 2012-12-08 LAB — POCT PREGNANCY, URINE: Preg Test, Ur: NEGATIVE

## 2012-12-08 LAB — CBC WITH DIFFERENTIAL/PLATELET
Basophils Absolute: 0.1 10*3/uL (ref 0.0–0.1)
Eosinophils Relative: 2 % (ref 0–5)
HCT: 41.1 % (ref 36.0–46.0)
Lymphocytes Relative: 26 % (ref 12–46)
Lymphs Abs: 3 10*3/uL (ref 0.7–4.0)
MCV: 87.4 fL (ref 78.0–100.0)
Monocytes Absolute: 0.7 10*3/uL (ref 0.1–1.0)
Neutro Abs: 7.5 10*3/uL (ref 1.7–7.7)
Platelets: 235 10*3/uL (ref 150–400)
RBC: 4.7 MIL/uL (ref 3.87–5.11)
RDW: 14.5 % (ref 11.5–15.5)
WBC: 11.3 10*3/uL — ABNORMAL HIGH (ref 4.0–10.5)

## 2012-12-08 LAB — URINALYSIS, ROUTINE W REFLEX MICROSCOPIC
Bilirubin Urine: NEGATIVE
Glucose, UA: NEGATIVE mg/dL
Hgb urine dipstick: NEGATIVE
Ketones, ur: NEGATIVE mg/dL
Protein, ur: NEGATIVE mg/dL
Urobilinogen, UA: 1 mg/dL (ref 0.0–1.0)

## 2012-12-08 LAB — URINE MICROSCOPIC-ADD ON

## 2012-12-08 LAB — PROTIME-INR
INR: 0.92 (ref 0.00–1.49)
Prothrombin Time: 12.3 seconds (ref 11.6–15.2)

## 2012-12-08 LAB — APTT: aPTT: 29 seconds (ref 24–37)

## 2012-12-08 MED ORDER — NAPROXEN 500 MG PO TABS: 500.0000 mg | ORAL_TABLET | Freq: Two times a day (BID) | ORAL | Status: DC

## 2012-12-08 MED ORDER — HYDROMORPHONE HCL PF 1 MG/ML IJ SOLN
1.0000 mg | Freq: Once | INTRAMUSCULAR | Status: AC
  Administered 2012-12-08: 1 mg via INTRAVENOUS
  Filled 2012-12-08: qty 1

## 2012-12-08 MED ORDER — CETIRIZINE HCL 10 MG PO CAPS: 10.0000 mg | ORAL_CAPSULE | Freq: Every day | ORAL | Status: DC

## 2012-12-08 NOTE — ED Provider Notes (Signed)
History     CSN: [id number]  Arrival date & time 12/08/12  2015   First MD Initiated Contact with Patient 12/08/12 2048      Chief Complaint  Patient presents with  . Epistaxis  . Ear Drainage  . Shortness of Breath  . Abdominal Pain     HPI 37 year old female with a history of hepatitis C and hypertension presents complaining of bleeding from her nose and ear as well as abdominal distention. Her abdominal distention has been present for several months and has been increasing in severity. She reports that she's gained about 40 pounds in 6 months. Her abdomen is sore all over. She denies any fever, chills. She does have nausea. No vomiting.  Additionally she has had bleeding from her nose and from her left ear. She states that the nose bleeding started about 4 days ago. She's been having once or twice daily nosebleeds that have been moderate in severity and resolved with holding pressure after about 2 minutes. Tonight her husband noted that blood was running out of her left ear. This resolved spontaneously within a few minutes as well. She states she uses Q-tips to clean her ears every day.  She states that she was diagnosed with hepatitis C in January. She states that she feels like she has had hepatitis for many years without knowing it because she was once told that she was unable to donate blood. She has been established with a gastroenterologist and is scheduled to start treatments for her hepatitis next month. Thus far she has not been diagnosed with cirrhosis, she has no bleeding disorders, she's not been having fevers, chills, nausea, vomiting, diarrhea, jaundice, or any other somatic and symptoms.   Past Medical History  Diagnosis Date  . Hypertension   . Gastric ulcer   . Kidney stones   . Hepatitis C   . Hyperlipidemia     Past Surgical History  Procedure Laterality Date  . Rt foot surgery    . Foot surgery    . Tubal ligation      Family History  Problem Relation  Age of Onset  . Heart disease Mother   . Diabetes Mother   . Peripheral Artery Disease Father     History  Substance Use Topics  . Smoking status: Current Every Day Smoker -- 1.00 packs/day    Types: Cigarettes  . Smokeless tobacco: Not on file  . Alcohol Use: No    OB History   Grav Para Term Preterm Abortions TAB SAB Ect Mult Living                  Review of Systems  Constitutional: Negative for fever, chills and diaphoresis.  HENT: Positive for nosebleeds. Negative for congestion, rhinorrhea, neck pain and neck stiffness.   Respiratory: Negative for cough, shortness of breath and wheezing.   Cardiovascular: Negative for chest pain and leg swelling.  Gastrointestinal: Positive for abdominal pain and abdominal distention. Negative for nausea, vomiting and diarrhea.  Genitourinary: Negative for dysuria, urgency, frequency, flank pain, vaginal bleeding, vaginal discharge and difficulty urinating.  Skin: Negative for rash.  Neurological: Negative for weakness, numbness and headaches.  All other systems reviewed and are negative.    Allergies  Aspirin; Ibuprofen; Tramadol; and Tylenol  Home Medications   Current Outpatient Rx  Name  Route  Sig  Dispense  Refill  . ALPRAZolam (XANAX) 0.25 MG tablet   Oral   Take 0.25 mg by mouth 2 (two) times daily.         Marland Kitchen  atenolol (TENORMIN) 25 MG tablet   Oral   Take 25 mg by mouth daily.          . ondansetron (ZOFRAN) 4 MG tablet   Oral   Take 4 mg by mouth every 8 (eight) hours as needed for nausea.         . pantoprazole (PROTONIX) 40 MG tablet   Oral   Take 40 mg by mouth daily.         . pravastatin (PRAVACHOL) 20 MG tablet   Oral   Take 20 mg by mouth daily.         . pregabalin (LYRICA) 75 MG capsule   Oral   Take 75 mg by mouth 3 (three) times daily.         . Cetirizine HCl (ZYRTEC ALLERGY) 10 MG CAPS   Oral   Take 1 capsule (10 mg total) by mouth daily.   30 capsule   0   . naproxen  (NAPROSYN) 500 MG tablet   Oral   Take 1 tablet (500 mg total) by mouth 2 (two) times daily.   30 tablet   0     BP 137/74  Pulse 96  Temp(Src) 98.2 F (36.8 C) (Oral)  Resp 18  SpO2 100%  LMP 09/26/2012  Physical Exam  Nursing note and vitals reviewed. Constitutional: She is oriented to person, place, and time. She appears well-developed and well-nourished. No distress.  HENT:  Head: Normocephalic and atraumatic.  Right Ear: Tympanic membrane, external ear and ear canal normal.  Left Ear: External ear and ear canal normal.  Nose: Nose normal. No mucosal edema or nasal septal hematoma. No epistaxis.  Mouth/Throat: Oropharynx is clear and moist.  Small amount of blood on left TM  Eyes: Conjunctivae and EOM are normal. Pupils are equal, round, and reactive to light. No scleral icterus.  Neck: Normal range of motion. Neck supple. No JVD present.  Cardiovascular: Normal rate, regular rhythm, normal heart sounds and intact distal pulses.  Exam reveals no gallop and no friction rub.   No murmur heard. Pulmonary/Chest: Effort normal and breath sounds normal. No respiratory distress. She has no wheezes. She has no rales.  Abdominal: Soft. Normal appearance and bowel sounds are normal. She exhibits no distension, no fluid wave and no ascites. There is tenderness in the right upper quadrant. There is no rigidity, no rebound, no guarding, no CVA tenderness, no tenderness at McBurney's point and negative Murphy's sign.  Musculoskeletal: She exhibits no edema.  Neurological: She is alert and oriented to person, place, and time. No cranial nerve deficit. She exhibits normal muscle tone. Coordination normal.  Skin: Skin is warm and dry. She is not diaphoretic.    ED Course  Procedures (including critical care time)  Labs Reviewed  CBC WITH DIFFERENTIAL - Abnormal; Notable for the following:    WBC 11.3 (*)    All other components within normal limits  COMPREHENSIVE METABOLIC PANEL -  Abnormal; Notable for the following:    Glucose, Bld 104 (*)    BUN 5 (*)    ALT 48 (*)    All other components within normal limits  URINALYSIS, ROUTINE W REFLEX MICROSCOPIC - Abnormal; Notable for the following:    APPearance CLOUDY (*)    Leukocytes, UA SMALL (*)    All other components within normal limits  URINE MICROSCOPIC-ADD ON - Abnormal; Notable for the following:    Squamous Epithelial / LPF FEW (*)    All other  components within normal limits  URINE CULTURE  LIPASE, BLOOD  PRO B NATRIURETIC PEPTIDE  APTT  PROTIME-INR  POCT I-STAT TROPONIN I  POCT PREGNANCY, URINE      1. Abdominal pain   2. Ear bleeding, left       MDM    37 year old female with history of hepatitis C presents complaining of abdominal swelling, weight gain, abdominal cramping, and bleeding from her nose and her left ear. Also her abdominal symptoms are chronic and been going for many months. Bedside ultrasound demonstrates no ascites.  On exam, she is initially tachycardic to 134.  This resolved after 1 L IVF.  Otherwise, normal vitals.  Well appearing.  NAD.  Small amount of blood on L TM.  HENT exam otherwise normal.  abd soft, non-distended, no ascites (confirmed by bedside US), mild TTP in RUQ, no rebound or guarding, negative Murphy sign.  Labs pertinent for mild leukocytosis, ALT 48, normal lipase, normal PT/PTT.  Given normal coags, normal LFTs, I doubt her mild nose bleeds are related to liver failure.  I do think her abd discomfort is likely 2/2 hepatitis C; doubt acute surgical process given benign exam, chronic in duration.  No ascites.  Stable for discharge.  At discharge, patient asks for Percocet.  States she cannot take tylenol or motrin, and her PCP cannot treat her pain "because she's just my primary doc."  I assured her that her PCP can manage pain.  Offered her naproxen.  She says she has never heard of naproxen; I explained it is an anti-inflammatory; she then changed her mind and  reports that she takes it all the time and it never works.  Again asked for Percocet.  Concerning for narcotic seeking behavior.  Advised PCP followup.      Toney Sang, MD 12/09/12 1047

## 2012-12-08 NOTE — ED Notes (Signed)
Have hep C; there is damage to liver June 11th for treatment. Suppose to start on 11/16/12. Pt. Is bleeding from nose, and now from years. Abd. Swollen. "ovaries and stuff" hurt. Rt. Side throbbing like a "heart beat." on exam in triage: no bleeding, dry blood in both ears and nose. No inflammation in ears and nose. Pt. Cleaned ears and nose with q-tips.

## 2012-12-09 ENCOUNTER — Encounter (HOSPITAL_COMMUNITY): Payer: Self-pay | Admitting: *Deleted

## 2012-12-09 ENCOUNTER — Emergency Department (HOSPITAL_COMMUNITY)
Admission: EM | Admit: 2012-12-09 | Discharge: 2012-12-09 | Discharge status: Against Medical Advice | Payer: Medicaid Other | Attending: Emergency Medicine | Admitting: Emergency Medicine

## 2012-12-09 DIAGNOSIS — Z8711 Personal history of peptic ulcer disease: Secondary | ICD-10-CM | POA: Insufficient documentation

## 2012-12-09 DIAGNOSIS — E785 Hyperlipidemia, unspecified: Secondary | ICD-10-CM | POA: Insufficient documentation

## 2012-12-09 DIAGNOSIS — F1310 Sedative, hypnotic or anxiolytic abuse, uncomplicated: Secondary | ICD-10-CM | POA: Insufficient documentation

## 2012-12-09 DIAGNOSIS — R1011 Right upper quadrant pain: Secondary | ICD-10-CM | POA: Insufficient documentation

## 2012-12-09 DIAGNOSIS — Z8669 Personal history of other diseases of the nervous system and sense organs: Secondary | ICD-10-CM | POA: Insufficient documentation

## 2012-12-09 DIAGNOSIS — G8929 Other chronic pain: Secondary | ICD-10-CM | POA: Insufficient documentation

## 2012-12-09 DIAGNOSIS — Z87442 Personal history of urinary calculi: Secondary | ICD-10-CM | POA: Insufficient documentation

## 2012-12-09 DIAGNOSIS — Z8679 Personal history of other diseases of the circulatory system: Secondary | ICD-10-CM | POA: Insufficient documentation

## 2012-12-09 DIAGNOSIS — Z791 Long term (current) use of non-steroidal anti-inflammatories (NSAID): Secondary | ICD-10-CM | POA: Insufficient documentation

## 2012-12-09 DIAGNOSIS — Z8719 Personal history of other diseases of the digestive system: Secondary | ICD-10-CM | POA: Insufficient documentation

## 2012-12-09 DIAGNOSIS — R55 Syncope and collapse: Secondary | ICD-10-CM | POA: Insufficient documentation

## 2012-12-09 DIAGNOSIS — Z8619 Personal history of other infectious and parasitic diseases: Secondary | ICD-10-CM | POA: Insufficient documentation

## 2012-12-09 DIAGNOSIS — R471 Dysarthria and anarthria: Secondary | ICD-10-CM | POA: Insufficient documentation

## 2012-12-09 DIAGNOSIS — I10 Essential (primary) hypertension: Secondary | ICD-10-CM | POA: Insufficient documentation

## 2012-12-09 DIAGNOSIS — R040 Epistaxis: Secondary | ICD-10-CM | POA: Insufficient documentation

## 2012-12-09 DIAGNOSIS — Z9851 Tubal ligation status: Secondary | ICD-10-CM | POA: Insufficient documentation

## 2012-12-09 DIAGNOSIS — F17200 Nicotine dependence, unspecified, uncomplicated: Secondary | ICD-10-CM | POA: Insufficient documentation

## 2012-12-09 DIAGNOSIS — Z79899 Other long term (current) drug therapy: Secondary | ICD-10-CM | POA: Insufficient documentation

## 2012-12-09 DIAGNOSIS — F411 Generalized anxiety disorder: Secondary | ICD-10-CM | POA: Insufficient documentation

## 2012-12-09 HISTORY — DX: Hepatic failure, unspecified without coma: K72.90

## 2012-12-09 MED ORDER — SODIUM CHLORIDE 0.9 % IV BOLUS (SEPSIS)
1000.0000 mL | Freq: Once | INTRAVENOUS | Status: AC
  Administered 2012-12-09: 1000 mL via INTRAVENOUS

## 2012-12-09 NOTE — ED Provider Notes (Signed)
History    This chart was scribed for Mary Shinn B. Bernette Mayers, MD by Toya Smothers, ED Scribe. The patient was seen in room APA14/APA14. Patient's care was started at 2112.  CSN: [id number]  Arrival date & time 12/09/12  2112   First MD Initiated Contact with Patient 12/09/12 2121      Chief Complaint  Patient presents with  . Loss of Consciousness    HPI  HPI Comments: Mary Jarvis is a 37 y.o. female with h/o who presents to the Emergency Department complaining of syncope 1 hour prior to arrival. Pt reports that for several minutes she was aware of her surrounding, though unable to respond. Pt now "feels fine now." She was at a friend's house. States husband called EMS. She denies any CP, SOB. Has had chronic abdominal pain and occasional nosebleeds for the last several weeks. Seen at [facility] ED last night for same and had extensive negative workup. Denies any narcotic or EtOH use today but states she took her normal Xanax dose around 5pm. Has been taking her normal Atenolol dose including this AM,. Denies any accidental or intentional overdose.     Past Medical History  Diagnosis Date  . Hypertension   . Gastric ulcer   . Kidney stones   . Hepatitis C   . Hyperlipidemia   . Liver failure     Past Surgical History  Procedure Laterality Date  . Rt foot surgery    . Foot surgery    . Tubal ligation      Family History  Problem Relation Age of Onset  . Heart disease Mother   . Diabetes Mother   . Peripheral Artery Disease Father     History  Substance Use Topics  . Smoking status: Current Every Day Smoker -- 1.00 packs/day    Types: Cigarettes  . Smokeless tobacco: Not on file  . Alcohol Use: No    Review of Systems  Neurological: Positive for syncope.  All other systems reviewed and are negative except as noted in HPI.    Allergies  Aspirin; Ibuprofen; Tramadol; and Tylenol  Home Medications   Current Outpatient Rx  Name  Route  Sig  Dispense  Refill  .  ALPRAZolam (XANAX) 0.25 MG tablet   Oral   Take 0.25 mg by mouth 2 (two) times daily.         Marland Kitchen atenolol (TENORMIN) 25 MG tablet   Oral   Take 25 mg by mouth daily.          . Cetirizine HCl (ZYRTEC ALLERGY) 10 MG CAPS   Oral   Take 1 capsule (10 mg total) by mouth daily.   30 capsule   0   . naproxen (NAPROSYN) 500 MG tablet   Oral   Take 1 tablet (500 mg total) by mouth 2 (two) times daily.   30 tablet   0   . ondansetron (ZOFRAN) 4 MG tablet   Oral   Take 4 mg by mouth every 8 (eight) hours as needed for nausea.         . pantoprazole (PROTONIX) 40 MG tablet   Oral   Take 40 mg by mouth daily.         . pravastatin (PRAVACHOL) 20 MG tablet   Oral   Take 20 mg by mouth daily.         . pregabalin (LYRICA) 75 MG capsule   Oral   Take 75 mg by mouth 3 (three) times daily.  BP 89/58  Pulse 103  Temp(Src) 97.4 F (36.3 C) (Oral)  Resp 16  Ht 5' (1.524 m)  Wt 194 lb (87.998 kg)  BMI 37.89 kg/m2  SpO2 99%  LMP 09/26/2012  Physical Exam  Nursing note and vitals reviewed. Constitutional: She is oriented to person, place, and time. She appears well-developed and well-nourished.  HENT:  Head: Normocephalic and atraumatic.  Eyes: EOM are normal. Pupils are equal, round, and reactive to light.  Neck: Normal range of motion. Neck supple.  Cardiovascular: Normal rate, normal heart sounds and intact distal pulses.   Pulmonary/Chest: Effort normal and breath sounds normal.  Abdominal: Bowel sounds are normal. She exhibits no distension. There is no guarding.  Mild tenderness RUQ  Musculoskeletal: Normal range of motion. She exhibits no edema and no tenderness.  Neurological: She is alert and oriented to person, place, and time. She has normal strength. No cranial nerve deficit or sensory deficit.  Mild dysarthria ? intoxication    Skin: Skin is warm and dry. No rash noted.  Psychiatric: She has a normal mood and affect.    ED Course   Procedures DIAGNOSTIC STUDIES: Oxygen Saturation is 99% on room air, normal by my interpretation.    COORDINATION OF CARE: 21:25- Evaluated Pt. Pt is awake, alert, and without distress.     Labs Reviewed  URINE CULTURE  COMPREHENSIVE METABOLIC PANEL  CBC WITH DIFFERENTIAL  URINALYSIS, ROUTINE W REFLEX MICROSCOPIC  TROPONIN I  URINE RAPID DRUG SCREEN (HOSP PERFORMED)  ETHANOL  AMMONIA   No results found.   1. Syncope       MDM  Pt initially hypotensive, ? Mild intoxication from Xanax but alert, oriented and able to follow commands. Does not appear to be impaired to any significant degree. She has had good improvement in BP with IVF 300cc but refuses additional fluids, blood work or urine tests. Has ambulated with normal gait and no further syncope. BP did not drop with standing. She understands that the ED would like to do additional workup of her syncope but she is refusing. She will sign out AMA.       I personally performed the services described in this documentation, which was scribed in my presence. The recorded information has been reviewed and is accurate.     Stephan Nelis B. Bernette Mayers, MD 12/09/12 2158

## 2012-12-09 NOTE — ED Notes (Signed)
Dx with Stage 3 liver failure x 3 weeks ago - syncopal episode today and one 3 days ago.  C/o pain to right side.  EMS reports pale skin upon their arrival with delayed cap refill.  Denies hitting head.  Pt alert and oriented x 4, groggy with slurred speech.

## 2012-12-10 LAB — URINE CULTURE
Colony Count: NO GROWTH
Culture: NO GROWTH

## 2012-12-12 ENCOUNTER — Other Ambulatory Visit: Payer: Self-pay | Admitting: Nurse Practitioner

## 2012-12-12 ENCOUNTER — Encounter: Payer: Self-pay | Admitting: Nurse Practitioner

## 2012-12-12 ENCOUNTER — Ambulatory Visit (INDEPENDENT_AMBULATORY_CARE_PROVIDER_SITE_OTHER): Payer: Medicaid Other | Admitting: Nurse Practitioner

## 2012-12-12 VITALS — BP 148/99 | HR 96 | Temp 97.7°F | Wt 208.0 lb

## 2012-12-12 DIAGNOSIS — B1920 Unspecified viral hepatitis C without hepatic coma: Secondary | ICD-10-CM | POA: Insufficient documentation

## 2012-12-12 DIAGNOSIS — R55 Syncope and collapse: Secondary | ICD-10-CM

## 2012-12-12 MED ORDER — LISINOPRIL-HYDROCHLOROTHIAZIDE 20-12.5 MG PO TABS: 1.0000 | ORAL_TABLET | Freq: Every day | ORAL | Status: DC

## 2012-12-12 NOTE — ED Provider Notes (Signed)
I saw and evaluated the patient, reviewed the resident's note and I agree with the findings and plan. At this time there does not appear to be any evidence of an acute emergency medical condition and the patient appears stable for discharge with appropriate outpatient follow up.  No sign of bleeding.  No ascites on exam.  Celene Kras, MD 12/12/12 [phone]

## 2012-12-12 NOTE — Patient Instructions (Signed)
Hepatitis C Hepatitis C is a viral infection of the liver. Infection may go undetected for months or years because symptoms may be absent or very mild. Chronic liver disease is the main danger of hepatitis C. This may lead to scarring of the liver (cirrhosis), liver failure, and liver cancer. CAUSES  Hepatitis C is caused by the hepatitis C virus (HCV). Formerly, hepatitis C infections were most commonly transmitted through blood transfusions. In the early 1990s, routine testing of donated blood for hepatitis C and exclusion of blood that tests positive for HCV began. Now, HCV is most commonly transmitted from person to person through injection drug use, sharing needles, or sex with an infected person. A caregiver may also get the infection from exposure to the blood of an infected patient by way of a cut or needle stick.  SYMPTOMS  Acute Phase Many cases of acute HCV infection are mild and cause few problems.Some people may not even realize they are sick.Symptoms in others may last a few weeks to several months and include:  Feeling very tired.  Loss of appetite.  Nausea.  Vomiting.  Abdominal pain.  Dark yellow urine.  Yellow skin and eyes (jaundice).  Itching of the skin. Chronic Phase  Between 50% to 85% of people who get HCV infection become "chronic carriers." They often have no symptoms, but the virus stays in their body.They may spread the virus to others and can get long-term liver disease.  Many people with chronic HCV infection remain healthy for many years. However, up to 1 in 5 chronically infected people may develop severe liver diseases including scarring of the liver (cirrhosis), liver failure, or liver cancer. DIAGNOSIS  Diagnosis of hepatitis C infection is made by testing blood for the presence of hepatitis C viral particles called RNA. Other tests may also be done to measure the status of current liver function, exclude other liver problems, or assess liver  damage. TREATMENT  Treatment with many antiviral drugs is available and recommended for some patients with chronic HCV infection. Drug treatment is generally considered appropriate for patients who:  Are 18 years of age or older.  Have a positive test for HCV particles in the blood.  Have a liver tissue sample (biopsy) that shows chronic hepatitis and significant scarring (fibrosis).  Do not have signs of liver failure.  Have acceptable blood test results that confirm the wellness of other body organs.  Are willing to be treated and conform to treatment requirements.  Have no other circumstances that would prevent treatment from being recommended (contraindications). All people who are offered and choose to receive drug treatment must understand that careful medical follow up for many months and even years is crucial in order to make successful care possible. The goal of drug treatment is to eliminate any evidence of HCV in the blood on a long-term basis. This is called a "sustained virologic response" or SVR. Achieving a SVR is associated with a decrease in the chance of life-threatening liver problems, need for a liver transplant, liver cancer rates, and liver-related complications. Successful treatment currently requires taking treatment drugs for at least 24 weeks and up to 72 weeks. An injected drug (interferon) given weekly and an oral antiviral medicine taken daily are usually prescribed. Side effects from these drugs are common and some may be very serious. Your response to treatment must be carefully monitored by both you and your caregiver throughout the entire treatment period. PREVENTION There is no vaccine for hepatitis C. The only   way to prevent the disease is to reduce the risk of exposure to the virus.   Avoid sharing drug needles or personal items like toothbrushes, razors, and nail clippers with an infected person.  Healthcare workers need to avoid injuries and wear  appropriate protective equipment such as gloves, gowns, and face masks when performing invasive medical or nursing procedures. HOME CARE INSTRUCTIONS  To avoid making your liver disease worse:  Strictly avoid drinking alcohol.  Carefully review all new prescriptions of medicines with your caregiver. Ask your caregiver which drugs you should avoid. The following drugs are toxic to the liver, and your caregiver may tell you to avoid them:  Isoniazid.  Methyldopa.  Acetaminophen.  Anabolic steroids (muscle-building drugs).  Erythromycin.  Oral contraceptives (birth control pills).  Check with your caregiver to make sure medicine you are currently taking will not be harmful.  Periodic blood tests may be required. Follow your caregiver's advice about when you should have blood tests.  Avoid a sexual relationship until advised otherwise by your caregiver.  Avoid activities that could expose other people to your blood. Examples include sharing a toothbrush, nail clippers, razors, and needles.  Bed rest is not necessary, but it may make you feel better. Recovery time is not related to the amount of rest you receive.  This infection is contagious. Follow your caregiver's instructions in order to avoid spread of the infection. SEEK IMMEDIATE MEDICAL CARE IF:  You have increasing fatigue or weakness.  You have an oral temperature above 102 F (38.9 C), not controlled by medicine.  You develop loss of appetite, nausea, or vomiting.  You develop jaundice.  You develop easy bruising or bleeding.  You develop any severe problems as a result of your treatment. MAKE SURE YOU:   Understand these instructions.  Will watch your condition.  Will get help right away if you are not doing well or get worse. Document Released: 07/10/2000 Document Revised: 10/05/2011 Document Reviewed: 11/12/2010 Exit[employer] Patient Information 2013 [employer].  

## 2012-12-12 NOTE — Progress Notes (Signed)
  Subjective:    Patient ID: Mary Jarvis, female    DOB: 09-Jul-1976, 37 y.o.   MRN: [id number]  HPI- Patient here today for hospital follow-up- Patient was in hospital with SOB and allergic rhinitis- Husband called ambulance Friday morning because she was feeling strange and she passed out. When she came to she could see but couldn't speak or move at all. By the time the [place] squad got there she was coming around. Er told her culd have been a seizure- Was told to stop Blood pressure meds.     Review of Systems  Constitutional: Positive for fatigue.  HENT: Positive for nosebleeds, congestion and sinus pressure.   Respiratory: Positive for cough, chest tightness and shortness of breath.   Cardiovascular: Positive for chest pain. Negative for palpitations and leg swelling.  Gastrointestinal: Negative.        Objective:   Physical Exam  Constitutional: She is oriented to person, place, and time. She appears well-developed and well-nourished.  HENT:  Head: Normocephalic.  Right Ear: External ear normal.  Left Ear: External ear normal.  Nose: Nose normal.  Mouth/Throat: Oropharynx is clear and moist.  Eyes: EOM are normal. Pupils are equal, round, and reactive to light.  Neck: Normal range of motion. Neck supple.  Cardiovascular: Normal rate and normal heart sounds.   Pulmonary/Chest: Effort normal and breath sounds normal.  Abdominal: Soft. Bowel sounds are normal. There is tenderness (right lower quadrant).  Lymphadenopathy:    She has no cervical adenopathy.  Neurological: She is alert and oriented to person, place, and time. She has normal reflexes.  Skin: Skin is warm and dry.  Psychiatric: She has a normal mood and affect. Her behavior is normal. Judgment and thought content normal.    BP 148/99  Pulse 96  Temp(Src) 97.7 F (36.5 C) (Oral)  Wt 208 lb (94.348 kg)  BMI 40.62 kg/m2  LMP 09/26/2012       Assessment & Plan:  Hospital follow-up  Labs pending  Need to  go back on  Stop atenolol -Lisinopril/hctz- 20/12.5 1 po qd #30 3 refills Need appointment for CPE  * patient wants refill of ultram and I told needs to have pharmacy fax rx to office to have filled.  Mary-Margaret Daphine Deutscher, FNP

## 2012-12-13 NOTE — Telephone Encounter (Signed)
LAST RF 11/04/12. SEE ALLERGIES. PLEASE PRINT.

## 2012-12-26 ENCOUNTER — Encounter (HOSPITAL_COMMUNITY): Payer: Self-pay | Admitting: *Deleted

## 2012-12-26 ENCOUNTER — Emergency Department (HOSPITAL_COMMUNITY)
Admission: EM | Admit: 2012-12-26 | Discharge: 2012-12-26 | Discharge status: Against Medical Advice | Payer: Medicaid Other | Attending: Emergency Medicine | Admitting: Emergency Medicine

## 2012-12-26 DIAGNOSIS — F17200 Nicotine dependence, unspecified, uncomplicated: Secondary | ICD-10-CM | POA: Insufficient documentation

## 2012-12-26 DIAGNOSIS — I10 Essential (primary) hypertension: Secondary | ICD-10-CM | POA: Insufficient documentation

## 2012-12-26 DIAGNOSIS — L559 Sunburn, unspecified: Secondary | ICD-10-CM | POA: Insufficient documentation

## 2012-12-26 NOTE — ED Notes (Signed)
Sunburn to arms , legs, chest.  [place]

## 2013-01-03 ENCOUNTER — Other Ambulatory Visit: Payer: Self-pay | Admitting: Nurse Practitioner

## 2013-01-03 ENCOUNTER — Other Ambulatory Visit: Payer: Self-pay | Admitting: *Deleted

## 2013-01-03 NOTE — Telephone Encounter (Signed)
LAST RF 12/02/12. LAST OV 12/12/12. IF APPROVED CALL IN [facility].

## 2013-01-04 MED ORDER — ALPRAZOLAM 0.25 MG PO TABS: 0.2500 mg | ORAL_TABLET | Freq: Two times a day (BID) | ORAL | Status: DC

## 2013-01-05 NOTE — Telephone Encounter (Signed)
Do not see on med list? 

## 2013-01-06 ENCOUNTER — Telehealth: Payer: Self-pay | Admitting: Nurse Practitioner

## 2013-01-09 ENCOUNTER — Other Ambulatory Visit: Payer: Self-pay | Admitting: Nurse Practitioner

## 2013-01-09 NOTE — Telephone Encounter (Signed)
done

## 2013-01-09 NOTE — Telephone Encounter (Signed)
See if this has been done?

## 2013-01-09 NOTE — Telephone Encounter (Signed)
Rx called in 

## 2013-01-12 NOTE — Telephone Encounter (Signed)
Last filled and seen 12/12/12   Print and have nurse call patient to pick up

## 2013-02-02 ENCOUNTER — Other Ambulatory Visit: Payer: Self-pay | Admitting: General Practice

## 2013-02-03 ENCOUNTER — Other Ambulatory Visit: Payer: Self-pay

## 2013-02-03 MED ORDER — ALPRAZOLAM 0.25 MG PO TABS: 0.2500 mg | ORAL_TABLET | Freq: Two times a day (BID) | ORAL | Status: DC

## 2013-02-03 NOTE — Telephone Encounter (Signed)
Please cll in xanax with 1 refill

## 2013-02-03 NOTE — Telephone Encounter (Signed)
Last filled 01/03/13  Last seen MMM 12/12/12   If approved phone in and have nurse notify patient

## 2013-02-04 NOTE — Telephone Encounter (Signed)
rx called into pharmacy tried to call pt but mail box was full

## 2013-02-15 ENCOUNTER — Ambulatory Visit (INDEPENDENT_AMBULATORY_CARE_PROVIDER_SITE_OTHER): Payer: Medicaid Other

## 2013-02-15 ENCOUNTER — Ambulatory Visit (INDEPENDENT_AMBULATORY_CARE_PROVIDER_SITE_OTHER): Payer: Medicaid Other | Admitting: Family Medicine

## 2013-02-15 ENCOUNTER — Encounter: Payer: Self-pay | Admitting: Family Medicine

## 2013-02-15 VITALS — BP 107/78 | HR 90 | Temp 97.8°F | Ht 61.0 in | Wt 197.0 lb

## 2013-02-15 DIAGNOSIS — M25551 Pain in right hip: Secondary | ICD-10-CM

## 2013-02-15 DIAGNOSIS — M25559 Pain in unspecified hip: Secondary | ICD-10-CM

## 2013-02-15 DIAGNOSIS — M549 Dorsalgia, unspecified: Secondary | ICD-10-CM

## 2013-02-15 MED ORDER — GABAPENTIN 600 MG PO TABS: 600.0000 mg | ORAL_TABLET | Freq: Three times a day (TID) | ORAL | Status: DC

## 2013-02-15 MED ORDER — CYCLOBENZAPRINE HCL 10 MG PO TABS: 10.0000 mg | ORAL_TABLET | Freq: Three times a day (TID) | ORAL | Status: DC | PRN

## 2013-02-15 NOTE — Progress Notes (Signed)
  Subjective:    Patient ID: Mary Jarvis, female    DOB: [birth year], 37 y.o.   MRN: [id number]  HPI This 37 y.o. female presents for evaluation of back pain.  She states a couple of  Weeks ago she was on a trampaline and she injured her back.   Review of Systems C/o back pain No chest pain, SOB, HA, dizziness, vision change, N/V, diarrhea, constipation, dysuria, urinary urgency or frequency or rash.     Objective:   Physical Exam Vital signs noted  Well developed well nourished female.  HEENT - Head atraumatic Normocephalic                Eyes - PERRLA, Conjuctiva - clear Sclera- Clear EOMI                Ears - EAC's Wnl TM's Wnl Gross Hearing WNL                Nose - Nares patent                 Throat - oropharanx wnl Respiratory - Lungs CTA bilateral Cardiac - RRR S1 and S2 without murmur MS - TTP bilat LS paraspinous muscles.    Assessment & Plan:  Back pain - Plan: cyclobenzaprine (FLEXERIL) 10 MG tablet, DG Lumbar Spine 2-3 Views, gabapentin (NEURONTIN) 600 MG tablet  Hip pain, right - Plan: DG Hip Complete Right.  Take flexeril and follow up prn.

## 2013-02-15 NOTE — Patient Instructions (Signed)
Back Pain, Adult  Low back pain is very common. About 1 in 5 people have back pain. The cause of low back pain is rarely dangerous. The pain often gets better over time. About half of people with a sudden onset of back pain feel better in just 2 weeks. About 8 in 10 people feel better by 6 weeks.   CAUSES  Some common causes of back pain include:  · Strain of the muscles or ligaments supporting the spine.  · Wear and tear (degeneration) of the spinal discs.  · Arthritis.  · Direct injury to the back.  DIAGNOSIS  Most of the time, the direct cause of low back pain is not known. However, back pain can be treated effectively even when the exact cause of the pain is unknown. Answering your caregiver's questions about your overall health and symptoms is one of the most accurate ways to make sure the cause of your pain is not dangerous. If your caregiver needs more information, he or she may order lab work or imaging tests (X-rays or MRIs). However, even if imaging tests show changes in your back, this usually does not require surgery.  HOME CARE INSTRUCTIONS  For many people, back pain returns. Since low back pain is rarely dangerous, it is often a condition that people can learn to manage on their own.   · Remain active. It is stressful on the back to sit or stand in one place. Do not sit, drive, or stand in one place for more than 30 minutes at a time. Take short walks on level surfaces as soon as pain allows. Try to increase the length of time you walk each day.  · Do not stay in bed. Resting more than 1 or 2 days can delay your recovery.  · Do not avoid exercise or work. Your body is made to move. It is not dangerous to be active, even though your back may hurt. Your back will likely heal faster if you return to being active before your pain is gone.  · Pay attention to your body when you  bend and lift. Many people have less discomfort when lifting if they bend their knees, keep the load close to their bodies, and  avoid twisting. Often, the most comfortable positions are those that put less stress on your recovering back.  · Find a comfortable position to sleep. Use a firm mattress and lie on your side with your knees slightly bent. If you lie on your back, put a pillow under your knees.  · Only take over-the-counter or prescription medicines as directed by your caregiver. Over-the-counter medicines to reduce pain and inflammation are often the most helpful. Your caregiver may prescribe muscle relaxant drugs. These medicines help dull your pain so you can more quickly return to your normal activities and healthy exercise.  · Put ice on the injured area.  · Put ice in a plastic bag.  · Place a towel between your skin and the bag.  · Leave the ice on for 15-20 minutes, 3-4 times a day for the first 2 to 3 days. After that, ice and heat may be alternated to reduce pain and spasms.  · Ask your caregiver about trying back exercises and gentle massage. This may be of some benefit.  · Avoid feeling anxious or stressed. Stress increases muscle tension and can worsen back pain. It is important to recognize when you are anxious or stressed and learn ways to manage it. Exercise is a great option.  SEEK MEDICAL CARE IF:  · You have pain that is not relieved with rest or   medicine.  · You have pain that does not improve in 1 week.  · You have new symptoms.  · You are generally not feeling well.  SEEK IMMEDIATE MEDICAL CARE IF:   · You have pain that radiates from your back into your legs.  · You develop new bowel or bladder control problems.  · You have unusual weakness or numbness in your arms or legs.  · You develop nausea or vomiting.  · You develop abdominal pain.  · You feel faint.  Document Released: 07/13/2005 Document Revised: 01/12/2012 Document Reviewed: 12/01/2010  [employer]® Patient Information ©2014 [employer].

## 2013-02-21 ENCOUNTER — Telehealth: Payer: Self-pay | Admitting: Nurse Practitioner

## 2013-02-21 ENCOUNTER — Other Ambulatory Visit: Payer: Self-pay | Admitting: Nurse Practitioner

## 2013-02-21 MED ORDER — TRAMADOL HCL 50 MG PO TABS: 50.0000 mg | ORAL_TABLET | Freq: Two times a day (BID) | ORAL | Status: DC | PRN

## 2013-02-22 NOTE — Telephone Encounter (Signed)
Can we refill tramadol

## 2013-02-22 NOTE — Telephone Encounter (Signed)
rx was printed yesterday and is ready for pick up

## 2013-02-23 NOTE — Telephone Encounter (Signed)
Patient picked up rx on 02-21-13

## 2013-02-26 ENCOUNTER — Encounter (HOSPITAL_COMMUNITY): Payer: Self-pay | Admitting: *Deleted

## 2013-02-26 ENCOUNTER — Emergency Department (HOSPITAL_COMMUNITY)
Admission: EM | Admit: 2013-02-26 | Discharge: 2013-02-26 | Disposition: A | Discharge status: Home / Self Care | Payer: Medicaid Other | Attending: Emergency Medicine | Admitting: Emergency Medicine

## 2013-02-26 DIAGNOSIS — K769 Liver disease, unspecified: Secondary | ICD-10-CM | POA: Insufficient documentation

## 2013-02-26 DIAGNOSIS — M549 Dorsalgia, unspecified: Secondary | ICD-10-CM

## 2013-02-26 DIAGNOSIS — Z8742 Personal history of other diseases of the female genital tract: Secondary | ICD-10-CM | POA: Insufficient documentation

## 2013-02-26 DIAGNOSIS — Z8619 Personal history of other infectious and parasitic diseases: Secondary | ICD-10-CM | POA: Insufficient documentation

## 2013-02-26 DIAGNOSIS — Z79899 Other long term (current) drug therapy: Secondary | ICD-10-CM | POA: Insufficient documentation

## 2013-02-26 DIAGNOSIS — Z862 Personal history of diseases of the blood and blood-forming organs and certain disorders involving the immune mechanism: Secondary | ICD-10-CM | POA: Insufficient documentation

## 2013-02-26 DIAGNOSIS — Z8679 Personal history of other diseases of the circulatory system: Secondary | ICD-10-CM | POA: Insufficient documentation

## 2013-02-26 DIAGNOSIS — K259 Gastric ulcer, unspecified as acute or chronic, without hemorrhage or perforation: Secondary | ICD-10-CM | POA: Insufficient documentation

## 2013-02-26 DIAGNOSIS — M545 Low back pain, unspecified: Secondary | ICD-10-CM | POA: Insufficient documentation

## 2013-02-26 DIAGNOSIS — Z8639 Personal history of other endocrine, nutritional and metabolic disease: Secondary | ICD-10-CM | POA: Insufficient documentation

## 2013-02-26 DIAGNOSIS — I10 Essential (primary) hypertension: Secondary | ICD-10-CM | POA: Insufficient documentation

## 2013-02-26 DIAGNOSIS — F411 Generalized anxiety disorder: Secondary | ICD-10-CM | POA: Insufficient documentation

## 2013-02-26 DIAGNOSIS — F17200 Nicotine dependence, unspecified, uncomplicated: Secondary | ICD-10-CM | POA: Insufficient documentation

## 2013-02-26 DIAGNOSIS — Z8669 Personal history of other diseases of the nervous system and sense organs: Secondary | ICD-10-CM | POA: Insufficient documentation

## 2013-02-26 MED ORDER — ACETAMINOPHEN-CODEINE #3 300-30 MG PO TABS: 1.0000 | ORAL_TABLET | Freq: Four times a day (QID) | ORAL | Status: DC | PRN

## 2013-02-26 MED ORDER — METHOCARBAMOL 500 MG PO TABS: 500.0000 mg | ORAL_TABLET | Freq: Two times a day (BID) | ORAL | Status: DC

## 2013-02-26 NOTE — ED Provider Notes (Signed)
CSN: [id number]     Arrival date & time 02/26/13  4098 History     First MD Initiated Contact with Patient 02/26/13 1110     Chief Complaint  Patient presents with  . Back Pain   (Consider location/radiation/quality/duration/timing/severity/associated sxs/prior Treatment) Patient is a 37 y.o. female presenting with back pain. The history is provided by the patient.  Back Pain Location:  Lumbar spine Quality:  Aching Radiates to:  R posterior upper leg and R thigh Pain severity:  Moderate Pain is:  Same all the time Onset quality:  Gradual Timing:  Constant Progression:  Worsening Chronicity:  New Context: falling, recent injury and twisting   Relieved by:  Bed rest Worsened by:  Ambulation, bending, twisting, movement and standing Ineffective treatments:  Muscle relaxants (flexeril) Associated symptoms: no abdominal pain, no abdominal swelling, no bladder incontinence, no bowel incontinence, no chest pain, no dysuria, no fever, no headaches, no leg pain, no numbness, no paresthesias, no pelvic pain, no perianal numbness, no tingling and no weakness   Risk factors comment:  Patient reports falling from a "water trampoline" 3 days ago    Past Medical History  Diagnosis Date  . Hypertension   . Gastric ulcer   . Hepatitis C   . Hyperlipidemia   . Liver failure   . Tachycardia   . Generalized anxiety disorder   . Peripheral neuropathy   . Kidney stones    Past Surgical History  Procedure Laterality Date  . Rt foot surgery    . Foot surgery    . Tubal ligation     Family History  Problem Relation Age of Onset  . Heart disease Mother   . Diabetes Mother   . Peripheral Artery Disease Father    History  Substance Use Topics  . Smoking status: Current Every Day Smoker -- 1.00 packs/day    Types: Cigarettes  . Smokeless tobacco: Not on file  . Alcohol Use: No   OB History   Grav Para Term Preterm Abortions TAB SAB Ect Mult Living                 Review of  Systems  Constitutional: Negative for fever.  Respiratory: Negative for shortness of breath.   Cardiovascular: Negative for chest pain.  Gastrointestinal: Negative for vomiting, abdominal pain, constipation and bowel incontinence.  Genitourinary: Negative for bladder incontinence, dysuria, hematuria, flank pain, decreased urine volume, difficulty urinating and pelvic pain.       No perineal numbness or incontinence of urine or feces  Musculoskeletal: Positive for back pain. Negative for joint swelling.  Skin: Negative for rash.  Neurological: Negative for tingling, weakness, numbness, headaches and paresthesias.  All other systems reviewed and are negative.    Allergies  Ancef; Aspirin; Ibuprofen; Tylenol; and Tramadol  Home Medications   Current Outpatient Rx  Name  Route  Sig  Dispense  Refill  . ALPRAZolam (XANAX) 0.25 MG tablet   Oral   Take 1 tablet (0.25 mg total) by mouth 2 (two) times daily.   60 tablet   1   . Cetirizine HCl (ZYRTEC ALLERGY) 10 MG CAPS   Oral   Take 1 capsule (10 mg total) by mouth daily.   30 capsule   0   . gabapentin (NEURONTIN) 600 MG tablet   Oral   Take 1 tablet (600 mg total) by mouth 3 (three) times daily.   90 tablet   5   . lisinopril-hydrochlorothiazide (ZESTORETIC) 20-12.5 MG per tablet  Oral   Take 1 tablet by mouth daily.   30 tablet   3   . ondansetron (ZOFRAN) 4 MG tablet   Oral   Take 4 mg by mouth every 8 (eight) hours as needed for nausea.         . pantoprazole (PROTONIX) 40 MG tablet   Oral   Take 40 mg by mouth daily.         . cyclobenzaprine (FLEXERIL) 10 MG tablet   Oral   Take 1 tablet (10 mg total) by mouth 3 (three) times daily as needed for muscle spasms.   30 tablet   0   . traMADol (ULTRAM) 50 MG tablet   Oral   Take 1 tablet (50 mg total) by mouth 2 (two) times daily as needed for pain.   60 tablet   0    BP 138/96  Pulse 102  Temp(Src) 98.6 F (37 C) (Oral)  Resp 18  Ht 5' (1.524 m)   Wt 197 lb (89.359 kg)  BMI 38.47 kg/m2  SpO2 100%  LMP 02/15/2013 Physical Exam  Nursing note and vitals reviewed. Constitutional: She is oriented to person, place, and time. She appears well-developed and well-nourished. No distress.  HENT:  Head: Normocephalic and atraumatic.  Neck: Normal range of motion. Neck supple.  Cardiovascular: Normal rate, regular rhythm, normal heart sounds and intact distal pulses.   No murmur heard. Pulmonary/Chest: Effort normal and breath sounds normal. No respiratory distress.  Abdominal: Soft. She exhibits no distension. There is no tenderness.  Musculoskeletal: She exhibits tenderness. She exhibits no edema.       Lumbar back: She exhibits tenderness and pain. She exhibits normal range of motion, no swelling, no deformity, no laceration and normal pulse.       Back:  ttp of the right lumbar paraspinal muscles and SI joint..  No spinal tenderness.  DP pulses are brisk and symmetrical.  Distal sensation intact.  Hip Flexors/Extensors are intact.  Patient has neg SLR bilaterally  Neurological: She is alert and oriented to person, place, and time. No cranial nerve deficit or sensory deficit. She exhibits normal muscle tone. Coordination and gait normal.  Reflex Scores:      Patellar reflexes are 2+ on the right side and 2+ on the left side.      Achilles reflexes are 2+ on the right side and 2+ on the left side. Skin: Skin is warm and dry.    ED Course   Procedures (including critical care time)  Labs Reviewed - No data to display No results found. No diagnosis found.  MDM    Patient has diffuse ttp of the lumbar paraspinal muscles.  No focal neuro deficits on exam.  Ambulates with a steady gait.  No concerning neurological sx's.  Patient previous medical charts and imaging reviewed. Had negative LS spine and right hip films from 02/15/13.   Patient had a visit with her PMD from 02/15/13 in which the complaint was also a trampoline accident.   Today, she states this accident occurred 3 days ago and she denies a second trampoline accident.  She has also been vague about her "tylenol allergy" stating that she is in a clinical study for hepatitis and was told not to take tylenol , and reports hx of bleeding ulcers and unable to take ibuprofen.  When I asked about her the visit on 02/15/13 with her PMD she stated that "that was just for my medication refills".  I also advised  her that x-rays were ordered at that time.  She stated that she has another appt to go back for results.    The inconsistencies in patient's hx is concerning for possible drug seeking behavior.  I have advised her that I will prescribe robaxin and tylenol #3 qty 10 but she will need further f/u with her PMD.     Bostyn Kunkler L. Trisha Mangle, PA-C 02/28/13 1335

## 2013-02-26 NOTE — ED Notes (Signed)
Pt was playing on a water trampoline, fell, felt something in her lower back "pop" pt c/o pain to lower back area that radiates down right leg area. Denies any problems with urinary or bowel movements, pt ambulatory to triage with no problems, has hx of bulging discs to lower back"

## 2013-02-28 NOTE — ED Provider Notes (Signed)
Medical screening examination/treatment/procedure(s) were performed by non-physician practitioner and as supervising physician I was immediately available for consultation/collaboration.   Joya Gaskins, MD 02/28/13 [phone]

## 2013-03-23 ENCOUNTER — Emergency Department (HOSPITAL_COMMUNITY): Payer: Medicaid Other

## 2013-03-23 ENCOUNTER — Emergency Department (HOSPITAL_COMMUNITY)
Admission: EM | Admit: 2013-03-23 | Discharge: 2013-03-23 | Disposition: A | Discharge status: Home / Self Care | Payer: Medicaid Other | Attending: Emergency Medicine | Admitting: Emergency Medicine

## 2013-03-23 DIAGNOSIS — F411 Generalized anxiety disorder: Secondary | ICD-10-CM | POA: Insufficient documentation

## 2013-03-23 DIAGNOSIS — Z79899 Other long term (current) drug therapy: Secondary | ICD-10-CM | POA: Insufficient documentation

## 2013-03-23 DIAGNOSIS — Z8669 Personal history of other diseases of the nervous system and sense organs: Secondary | ICD-10-CM | POA: Insufficient documentation

## 2013-03-23 DIAGNOSIS — Z8639 Personal history of other endocrine, nutritional and metabolic disease: Secondary | ICD-10-CM | POA: Insufficient documentation

## 2013-03-23 DIAGNOSIS — Z8619 Personal history of other infectious and parasitic diseases: Secondary | ICD-10-CM | POA: Insufficient documentation

## 2013-03-23 DIAGNOSIS — Z862 Personal history of diseases of the blood and blood-forming organs and certain disorders involving the immune mechanism: Secondary | ICD-10-CM | POA: Insufficient documentation

## 2013-03-23 DIAGNOSIS — Z87442 Personal history of urinary calculi: Secondary | ICD-10-CM | POA: Insufficient documentation

## 2013-03-23 DIAGNOSIS — G2402 Drug induced acute dystonia: Secondary | ICD-10-CM | POA: Insufficient documentation

## 2013-03-23 DIAGNOSIS — Z8679 Personal history of other diseases of the circulatory system: Secondary | ICD-10-CM | POA: Insufficient documentation

## 2013-03-23 DIAGNOSIS — F17200 Nicotine dependence, unspecified, uncomplicated: Secondary | ICD-10-CM | POA: Insufficient documentation

## 2013-03-23 DIAGNOSIS — Z8719 Personal history of other diseases of the digestive system: Secondary | ICD-10-CM | POA: Insufficient documentation

## 2013-03-23 DIAGNOSIS — I10 Essential (primary) hypertension: Secondary | ICD-10-CM | POA: Insufficient documentation

## 2013-03-23 LAB — POCT I-STAT, CHEM 8
Calcium, Ion: 1.2 mmol/L (ref 1.12–1.23)
Glucose, Bld: 104 mg/dL — ABNORMAL HIGH (ref 70–99)
HCT: 37 % (ref 36.0–46.0)
Hemoglobin: 12.6 g/dL (ref 12.0–15.0)
Potassium: 3.7 mEq/L (ref 3.5–5.1)

## 2013-03-23 LAB — GLUCOSE, CAPILLARY: Glucose-Capillary: 108 mg/dL — ABNORMAL HIGH (ref 70–99)

## 2013-03-23 MED ORDER — DIPHENHYDRAMINE HCL 50 MG/ML IJ SOLN: 25.0000 mg | Freq: Once | INTRAMUSCULAR | Status: DC

## 2013-03-23 MED ORDER — DIPHENHYDRAMINE HCL 50 MG/ML IJ SOLN
50.0000 mg | Freq: Once | INTRAMUSCULAR | Status: AC
  Administered 2013-03-23: 50 mg via INTRAVENOUS

## 2013-03-23 MED ORDER — DIPHENHYDRAMINE HCL 25 MG PO TABS: 25.0000 mg | ORAL_TABLET | Freq: Three times a day (TID) | ORAL | Status: DC

## 2013-03-23 MED ORDER — DIPHENHYDRAMINE HCL 50 MG/ML IJ SOLN
INTRAMUSCULAR | Status: AC
  Filled 2013-03-23: qty 1

## 2013-03-23 NOTE — ED Notes (Signed)
No further reactions noted.  Pt education regarding dystonic reaction.

## 2013-03-23 NOTE — ED Provider Notes (Signed)
CSN: [id number]     Arrival date & time 03/23/13  1925 History   First MD Initiated Contact with Patient 03/23/13 1943     No chief complaint on file.  (Consider location/radiation/quality/duration/timing/severity/associated sxs/prior Treatment) HPI Comments: 87 y F here with acute onset facial paralysis approx 4 hours ago, worsening.  She reports taking a risperdal from a friend last night to help her sleep.  Her symptoms began with some jaw spasm and progressed to involve her face and neck.  She reports severe 10/10 pain.  She was activated as a Code Stroke due to reports by EMS and proceeded directly to the CT scanner.   The history is provided by the patient.    Past Medical History  Diagnosis Date  . Hypertension   . Gastric ulcer   . Hepatitis C   . Hyperlipidemia   . Liver failure   . Tachycardia   . Generalized anxiety disorder   . Peripheral neuropathy   . Kidney stones    Past Surgical History  Procedure Laterality Date  . Rt foot surgery    . Foot surgery    . Tubal ligation     Family History  Problem Relation Age of Onset  . Heart disease Mother   . Diabetes Mother   . Peripheral Artery Disease Father    History  Substance Use Topics  . Smoking status: Current Every Day Smoker -- 1.00 packs/day    Types: Cigarettes  . Smokeless tobacco: Not on file  . Alcohol Use: No   OB History   Grav Para Term Preterm Abortions TAB SAB Ect Mult Living                 Review of Systems  Constitutional: Negative for fever and chills.  HENT: Negative for ear pain, congestion, mouth sores, trouble swallowing, neck stiffness and tinnitus.   Eyes: Negative for photophobia, pain and visual disturbance.  Respiratory: Negative for choking, chest tightness and shortness of breath.   Cardiovascular: Negative for chest pain.  Gastrointestinal: Negative for nausea, vomiting and diarrhea.  Endocrine: Negative for polydipsia, polyphagia and polyuria.  Genitourinary: Negative  for dysuria.  Musculoskeletal: Negative for back pain, joint swelling and gait problem.  Skin: Negative for rash.  Neurological: Negative for dizziness, seizures, syncope, weakness, light-headedness, numbness and headaches.  All other systems reviewed and are negative.    Allergies  Ancef; Aspirin; Ibuprofen; Tylenol; and Tramadol  Home Medications   Current Outpatient Rx  Name  Route  Sig  Dispense  Refill  . acetaminophen-codeine (TYLENOL #3) 300-30 MG per tablet   Oral   Take 1-2 tablets by mouth every 6 (six) hours as needed for pain.   10 tablet   0   . ALPRAZolam (XANAX) 0.25 MG tablet   Oral   Take 1 tablet (0.25 mg total) by mouth 2 (two) times daily.   60 tablet   1   . Cetirizine HCl (ZYRTEC ALLERGY) 10 MG CAPS   Oral   Take 1 capsule (10 mg total) by mouth daily.   30 capsule   0   . cyclobenzaprine (FLEXERIL) 10 MG tablet   Oral   Take 1 tablet (10 mg total) by mouth 3 (three) times daily as needed for muscle spasms.   30 tablet   0   . lisinopril-hydrochlorothiazide (ZESTORETIC) 20-12.5 MG per tablet   Oral   Take 1 tablet by mouth daily.   30 tablet   3   . methocarbamol (  ROBAXIN) 500 MG tablet   Oral   Take 1 tablet (500 mg total) by mouth 2 (two) times daily.   20 tablet   0   . ondansetron (ZOFRAN) 4 MG tablet   Oral   Take 4 mg by mouth every 8 (eight) hours as needed for nausea.         . pantoprazole (PROTONIX) 40 MG tablet   Oral   Take 40 mg by mouth daily.         . traMADol (ULTRAM) 50 MG tablet   Oral   Take 1 tablet (50 mg total) by mouth 2 (two) times daily as needed for pain.   60 tablet   0    BP 124/89  Pulse 105  Temp(Src) 97.8 F (36.6 C) (Oral)  Resp 15  Ht 5' (1.524 m)  Wt 190 lb (86.183 kg)  BMI 37.11 kg/m2  SpO2 99%  LMP 02/15/2013 Physical Exam  Vitals reviewed. Constitutional: She is oriented to person, place, and time. She appears well-developed and well-nourished.  HENT:  Right Ear: External  ear normal.  Left Ear: External ear normal.  Mouth/Throat: No oropharyngeal exudate.  Spasm of neck and facial muscles  Eyes: Conjunctivae and EOM are normal. Pupils are equal, round, and reactive to light.  Neck: Normal range of motion. Neck supple.  Cardiovascular: Normal rate, regular rhythm, normal heart sounds and intact distal pulses.  Exam reveals no gallop and no friction rub.   No murmur heard. Pulmonary/Chest: Effort normal and breath sounds normal.  Abdominal: Soft. Bowel sounds are normal. She exhibits no distension. There is no tenderness.  Musculoskeletal: Normal range of motion. She exhibits no edema.  Neurological: She is alert and oriented to person, place, and time. No cranial nerve deficit.  Moving all four extremities equally with 5/5 gross strength  Skin: Skin is warm and dry. No rash noted.  Psychiatric: She has a normal mood and affect.    ED Course  Procedures (including critical care time) Labs Review Labs Reviewed  GLUCOSE, CAPILLARY - Abnormal; Notable for the following:    Glucose-Capillary 108 (*)    All other components within normal limits  POCT I-STAT, CHEM 8 - Abnormal; Notable for the following:    BUN 4 (*)    Glucose, Bld 104 (*)    All other components within normal limits  POCT I-STAT TROPONIN I   Imaging Review Ct Head Wo Contrast  03/23/2013   *RADIOLOGY REPORT*  Clinical Data: Code stroke  CT HEAD WITHOUT CONTRAST  Technique:  Contiguous axial images were obtained from the base of the skull through the vertex without contrast.  Comparison: 10/29/2011; Neck CT - 10/29/2011  Findings:  The examination is minimally degraded due to patient motion artifact.  Gray white differentiation is maintained.  No CT evidence of acute large territory infarct.  No intraparenchymal or extra-axial mass or hemorrhage.  Normal size and configuration of the ventricles and basilar cisterns.  No midline shift.  Limited visualization of the paranasal sinuses and mastoid  air cells are normal.  No air fluid levels.  Regional soft tissues are normal.  No displaced calvarial fracture.  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  IMPRESSION:  Negative non-contrast head CT.  Above findings discussed with Dr. Beverely Pace at [phone].   Original Report Authenticated By: Tacey Ruiz, MD    MDM  53 y F here with acute onset facial paralysis approx 4 hours ago, worsening.  She reports taking a risperdal from a friend last night to help her sleep.  Her symptoms began with some jaw spasm and progressed to involve her face and neck.  She reports severe 10/10 pain.  She was activated as a Code Stroke due to reports by EMS and proceeded directly to the CT scanner.  History and physical exam c/w acute dystonic reaction.  50 mg Benadryl IV given with immediate resolution.  Stroke labs cancelled.  Will d/c with 2 days worth of Benadryl. She was counseled to avoid such medications in the future and to discuss all further medications with her physician.  It is felt the pt is stable for discharge with PCP f/u.  Return precautions reviewed.  Clinical Impression: 1. Acute dystonic reaction due to drugs     Disposition: Discharge  Condition: Good  I have discussed the results, Dx and Tx plan. They expressed understanding and agree with the plan and were told to return to ED with any worsening of condition or concern.    Discharge Medication List as of 03/23/2013  8:38 PM    START taking these medications   Details  diphenhydrAMINE (BENADRYL) 25 MG tablet Take 1 tablet (25 mg total) by mouth every 8 (eight) hours., Starting 03/23/2013, Until Discontinued, Print        Follow Up: Ernestina Penna, MD [address] [place] [address] [phone]  Schedule an appointment as soon as possible for a visit    Pt seen in conjunction with Dr. Rhunette Croft.  Reine Just.  Beverely Pace, MD Emergency Medicine PGY-III [phone]    Oleh Genin, MD 03/24/13 [phone]

## 2013-03-23 NOTE — ED Notes (Signed)
Immediate relief of symptoms with IV benadryl.  Pt able to speak clearly, control eyes, control neck movement.

## 2013-03-23 NOTE — ED Notes (Addendum)
Code stroke cancelled by Dr Alanson Puls.  Pt experiencing a dystonic reaction, most likely due to respirdol that she took last night.

## 2013-03-24 ENCOUNTER — Other Ambulatory Visit: Payer: Self-pay | Admitting: Nurse Practitioner

## 2013-03-28 MED ORDER — TRAMADOL HCL 50 MG PO TABS: 50.0000 mg | ORAL_TABLET | Freq: Two times a day (BID) | ORAL | Status: DC | PRN

## 2013-03-28 NOTE — Telephone Encounter (Signed)
Last filled 02/21/13, saw you 02/15/13. Rx will print, have nurse call pt [phone] to pick up

## 2013-03-28 NOTE — Telephone Encounter (Signed)
Called in by [employer]

## 2013-03-29 NOTE — ED Provider Notes (Signed)
I performed a history and physical examination of  Mary Jarvis and discussed her management with Dr. Beverely Pace. I agree with the history, physical, assessment, and plan of care, with the following exceptions: None I was present for the following procedures: None  Time Spent in Critical Care of the patient: None  Time spent in discussions with the patient and family:  Minutes  Pt comes in with cc of code stroke. Has jaw spasms, took Risperdal yday, that is not prescribed to her. Exam consistent with EPS like sx. Benadryl given, and patient responded. Stable for discharge.  Mary Jarvis   Mary Kaplan, MD 03/29/13 [phone]

## 2013-04-03 ENCOUNTER — Ambulatory Visit (INDEPENDENT_AMBULATORY_CARE_PROVIDER_SITE_OTHER): Payer: Medicaid Other | Admitting: Family Medicine

## 2013-04-03 ENCOUNTER — Encounter: Payer: Self-pay | Admitting: Family Medicine

## 2013-04-03 DIAGNOSIS — F411 Generalized anxiety disorder: Secondary | ICD-10-CM

## 2013-04-03 DIAGNOSIS — K219 Gastro-esophageal reflux disease without esophagitis: Secondary | ICD-10-CM

## 2013-04-03 DIAGNOSIS — R110 Nausea: Secondary | ICD-10-CM

## 2013-04-03 MED ORDER — PANTOPRAZOLE SODIUM 40 MG PO TBEC: 40.0000 mg | DELAYED_RELEASE_TABLET | Freq: Every day | ORAL | Status: DC

## 2013-04-03 MED ORDER — ALPRAZOLAM 0.5 MG PO TABS: 0.5000 mg | ORAL_TABLET | Freq: Every evening | ORAL | Status: DC | PRN

## 2013-04-03 MED ORDER — PROMETHAZINE HCL 25 MG PO TABS: 25.0000 mg | ORAL_TABLET | Freq: Three times a day (TID) | ORAL | Status: DC | PRN

## 2013-04-03 NOTE — Progress Notes (Signed)
  Subjective:    Patient ID: Mary Jarvis, female    DOB: 08-04-1975, 37 y.o.   MRN: [id number]  HPI  This 37 y.o. female presents for evaluation of nausea and weakness over the weekend. She has been feeling washed out and she has been acutely ill with nausea and feeling Fatigued.  She has hx of GERD and needs refill of protonix.  She is receiving interferon tx's from Infectious disease for hepatitis C. She is having increased anxiety from tx's.  She states she Has 5 weeks of tx left.  She states the xanax 0.25mg  is not working as well.  Review of Systems C/o nausea, GERD sx's, malaise, and anxiety No chest pain, SOB, HA, dizziness, vision change, N/V, diarrhea, constipation, dysuria, urinary urgency or frequency, myalgias, arthralgias or rash.     Objective:   Physical Exam  Vital signs noted  Well developed well nourished female.  HEENT - Head atraumatic Normocephalic                Eyes - PERRLA, Conjuctiva - clear Sclera- Clear EOMI                Ears - EAC's Wnl TM's Wnl Gross Hearing WNL                Nose - Nares patent                 Throat - oropharanx wnl Respiratory - Lungs CTA bilateral Cardiac - RRR S1 and S2 without murmur GI - Abdomen soft Nontender and bowel sounds active x 4 Extremities - No edema. Neuro - Grossly intact.      Assessment & Plan:  Anxiety state, unspecified - Plan: ALPRAZolam (XANAX) 0.5 MG tablet.  Discussed she may need to go on sertraline and she states she has been told by her GI that she is not to start any depression meds.  GERD (gastroesophageal reflux disease) - Plan: pantoprazole (PROTONIX) 40 MG tablet  Nausea alone - Plan: promethazine (PHENERGAN) 25 MG tablet po qid prn.  Discussed she Has viral syndrome and needs to rest and push po fluids.  Back pain - She reports feeling better.  She has had LS spine xray and the results are discussed. She has some spurring on the L3 -L4 and L4-L5 vertebrae.

## 2013-04-03 NOTE — Patient Instructions (Signed)

## 2013-04-13 ENCOUNTER — Telehealth: Payer: Self-pay | Admitting: Family Medicine

## 2013-04-13 NOTE — Telephone Encounter (Signed)
Are you reducing the quantity of her xanax or was this an error?

## 2013-04-14 NOTE — Telephone Encounter (Signed)
I am reducing her xanax and she can take one half to one and if needing it increased please follow up.

## 2013-04-17 ENCOUNTER — Telehealth: Payer: Self-pay | Admitting: *Deleted

## 2013-04-17 NOTE — Telephone Encounter (Signed)
Pt notified of the change in dosage She will return to clinic if sxs worsen

## 2013-05-02 ENCOUNTER — Other Ambulatory Visit: Payer: Self-pay | Admitting: Family Medicine

## 2013-05-02 NOTE — Telephone Encounter (Signed)
Last seen 04/03/13  [facility] [place]  If approved print and route to nurse

## 2013-05-03 ENCOUNTER — Encounter (INDEPENDENT_AMBULATORY_CARE_PROVIDER_SITE_OTHER): Payer: Self-pay

## 2013-05-03 ENCOUNTER — Ambulatory Visit (INDEPENDENT_AMBULATORY_CARE_PROVIDER_SITE_OTHER): Payer: Medicaid Other | Admitting: Family Medicine

## 2013-05-03 ENCOUNTER — Telehealth: Payer: Self-pay | Admitting: Family Medicine

## 2013-05-03 ENCOUNTER — Encounter: Payer: Self-pay | Admitting: Family Medicine

## 2013-05-03 VITALS — BP 138/89 | HR 115 | Temp 97.9°F | Ht 61.0 in | Wt 202.0 lb

## 2013-05-03 DIAGNOSIS — M79671 Pain in right foot: Secondary | ICD-10-CM

## 2013-05-03 DIAGNOSIS — G894 Chronic pain syndrome: Secondary | ICD-10-CM

## 2013-05-03 DIAGNOSIS — F411 Generalized anxiety disorder: Secondary | ICD-10-CM

## 2013-05-03 DIAGNOSIS — M79609 Pain in unspecified limb: Secondary | ICD-10-CM

## 2013-05-03 DIAGNOSIS — M549 Dorsalgia, unspecified: Secondary | ICD-10-CM

## 2013-05-03 MED ORDER — GABAPENTIN 600 MG PO TABS: ORAL_TABLET | ORAL | Status: DC

## 2013-05-03 MED ORDER — TRAMADOL HCL 50 MG PO TABS: ORAL_TABLET | ORAL | Status: DC

## 2013-05-03 MED ORDER — TRAMADOL HCL 50 MG PO TABS: 50.0000 mg | ORAL_TABLET | Freq: Two times a day (BID) | ORAL | Status: DC | PRN

## 2013-05-03 NOTE — Progress Notes (Signed)
  Subjective:    Patient ID: Mary Jarvis, female    DOB: 1976/05/23, 37 y.o.   MRN: [id number]  HPI This 37 y.o. female presents for evaluation of back pain.  She has chronic back pain And she has right foot pain.  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  She was seeing Dr. Ulice Brilliant Podiatrist who quit taking Her insurance and she needs a referral to another podiatrist.  She c/o of severe right foot  Pain.  She states that the ultram is not helping her pain and wants to go up on the frequency to Tid prn pain since she is not better.  She states the gabapentin is not helping.  She Requests flexeril for pain.  She states "You have only rx'd me flexeril once since I have been Seeing you and I feel like I need more."  " I need more xanax than you are prescribing and I feel Like you do not understand what I need and it is frustrating that you are not giving me anymore Xanax."  " I cannot take any depression medicine while I am under tx for my hepatitis C."   "Don't take this wrong, but I feel like I come here and I am not getting the help I need."    Review of Systems C/o back pain, right foot pain, back pain, and anxiety.   No chest pain, SOB, HA, dizziness, vision change, N/V, diarrhea, constipation, dysuria, urinary urgency or frequency, or rash.  Objective:   Physical Exam  Vital signs noted  Well developed well nourished female in NAD.  HEENT - Head atraumatic Normocephalic                Eyes - PERRLA, Conjuctiva - clear Sclera- Clear EOMI                Ears - EAC's Wnl TM's Wnl Gross Hearing WNL                Throat - oropharanx wnl Respiratory - Lungs CTA bilateral Cardiac - RRR S1 and S2 without murmur GI - Abdomen soft Nontender and bowel sounds active x 4 Extremities - No edema. Neuro - Grossly intact.      Assessment & Plan:  Right foot pain - Plan: traMADol (ULTRAM) 50 MG tablet, Ambulatory referral to  Orthopedic Surgery, gabapentin (NEURONTIN) 600 MG tablet  Back pain - Plan: traMADol (ULTRAM) 50 MG tablet, Ambulatory referral to Orthopedic Surgery, gabapentin (NEURONTIN) 600 MG tablet. Instructed patient that she would probably be happier at pain management.  Chronic pain syndrome - Plan: gabapentin (NEURONTIN) 600 MG tablet.  Refer to pain management. I did run a CSR after patient left and she has received 2 hydrocodone rx from 2 different providers and so I call [facility] and ask To have her Tramadol rx cancelled and her xanax rx cancelled.  Anxiety state, unspecified - Explained that I am uncomfortable escalating her xanax and recommend a psychiatrist to help her with any Other alternative tx's since she states her GI instructed her not to take any depression medincine.  Xanax rx was cancelled due to finding she Has gone to 2 other providers for pain medicine.    Follow up prn.  Deatra Canter FNP

## 2013-05-03 NOTE — Patient Instructions (Signed)

## 2013-05-04 ENCOUNTER — Telehealth: Payer: Self-pay | Admitting: Family Medicine

## 2013-05-04 NOTE — Telephone Encounter (Signed)
ADVISED REF WERE PUT IN ON 05/03/13.

## 2013-05-10 ENCOUNTER — Encounter: Payer: Self-pay | Admitting: Family Medicine

## 2013-05-22 ENCOUNTER — Telehealth: Payer: Self-pay | Admitting: Family Medicine

## 2013-08-23 ENCOUNTER — Emergency Department (HOSPITAL_COMMUNITY)
Admission: EM | Admit: 2013-08-23 | Discharge: 2013-08-23 | Disposition: A | Discharge status: Home / Self Care | Payer: Medicaid Other | Attending: Emergency Medicine | Admitting: Emergency Medicine

## 2013-08-23 ENCOUNTER — Encounter (HOSPITAL_COMMUNITY): Payer: Self-pay | Admitting: Emergency Medicine

## 2013-08-23 DIAGNOSIS — Z8619 Personal history of other infectious and parasitic diseases: Secondary | ICD-10-CM | POA: Insufficient documentation

## 2013-08-23 DIAGNOSIS — Z8719 Personal history of other diseases of the digestive system: Secondary | ICD-10-CM | POA: Insufficient documentation

## 2013-08-23 DIAGNOSIS — Z8659 Personal history of other mental and behavioral disorders: Secondary | ICD-10-CM | POA: Insufficient documentation

## 2013-08-23 DIAGNOSIS — Z8669 Personal history of other diseases of the nervous system and sense organs: Secondary | ICD-10-CM | POA: Insufficient documentation

## 2013-08-23 DIAGNOSIS — Y929 Unspecified place or not applicable: Secondary | ICD-10-CM | POA: Insufficient documentation

## 2013-08-23 DIAGNOSIS — F17200 Nicotine dependence, unspecified, uncomplicated: Secondary | ICD-10-CM | POA: Insufficient documentation

## 2013-08-23 DIAGNOSIS — S46911A Strain of unspecified muscle, fascia and tendon at shoulder and upper arm level, right arm, initial encounter: Secondary | ICD-10-CM

## 2013-08-23 DIAGNOSIS — I10 Essential (primary) hypertension: Secondary | ICD-10-CM | POA: Insufficient documentation

## 2013-08-23 DIAGNOSIS — Z8639 Personal history of other endocrine, nutritional and metabolic disease: Secondary | ICD-10-CM | POA: Insufficient documentation

## 2013-08-23 DIAGNOSIS — X500XXA Overexertion from strenuous movement or load, initial encounter: Secondary | ICD-10-CM | POA: Insufficient documentation

## 2013-08-23 DIAGNOSIS — S0993XA Unspecified injury of face, initial encounter: Secondary | ICD-10-CM | POA: Insufficient documentation

## 2013-08-23 DIAGNOSIS — Y9389 Activity, other specified: Secondary | ICD-10-CM | POA: Insufficient documentation

## 2013-08-23 DIAGNOSIS — Z87442 Personal history of urinary calculi: Secondary | ICD-10-CM | POA: Insufficient documentation

## 2013-08-23 DIAGNOSIS — Z862 Personal history of diseases of the blood and blood-forming organs and certain disorders involving the immune mechanism: Secondary | ICD-10-CM | POA: Insufficient documentation

## 2013-08-23 DIAGNOSIS — IMO0002 Reserved for concepts with insufficient information to code with codable children: Secondary | ICD-10-CM | POA: Insufficient documentation

## 2013-08-23 DIAGNOSIS — S199XXA Unspecified injury of neck, initial encounter: Secondary | ICD-10-CM

## 2013-08-23 MED ORDER — METHOCARBAMOL 500 MG PO TABS: 500.0000 mg | ORAL_TABLET | Freq: Three times a day (TID) | ORAL | Status: DC

## 2013-08-23 MED ORDER — DEXAMETHASONE 4 MG PO TABS: ORAL_TABLET | ORAL | Status: DC

## 2013-08-23 MED ORDER — HYDROCODONE-ACETAMINOPHEN 5-325 MG PO TABS: 1.0000 | ORAL_TABLET | Freq: Four times a day (QID) | ORAL | Status: DC | PRN

## 2013-08-23 NOTE — ED Notes (Signed)
Pt c/o right shoulder pain x1 day. Pt reports heavy lifting yesterday. Pain worse with movement.

## 2013-08-23 NOTE — ED Provider Notes (Signed)
Medical screening examination/treatment/procedure(s) were performed by non-physician practitioner and as supervising physician I was immediately available for consultation/collaboration.  EKG Interpretation   None         Shelda JakesScott W. Rashi Granier, MD 08/23/13 1538

## 2013-08-23 NOTE — ED Provider Notes (Signed)
CSN: [id number]     Arrival date & time 08/23/13  1152 History   First MD Initiated Contact with Patient 08/23/13 1417     Chief Complaint  Patient presents with  . Shoulder Pain   (Consider location/radiation/quality/duration/timing/severity/associated sxs/prior Treatment) Patient is a 38 y.o. female presenting with shoulder pain. The history is provided by the patient.  Shoulder Pain This is a new problem. The current episode started yesterday. The problem occurs constantly. The problem has been gradually worsening. Associated symptoms include neck pain. Pertinent negatives include no abdominal pain, arthralgias, chest pain, chills, coughing, nausea or numbness. Exacerbated by: movement. She has tried nothing for the symptoms. The treatment provided no relief.    Past Medical History  Diagnosis Date  . Hypertension   . Gastric ulcer   . Hepatitis C   . Hyperlipidemia   . Liver failure   . Tachycardia   . Generalized anxiety disorder   . Peripheral neuropathy   . Kidney stones    Past Surgical History  Procedure Laterality Date  . Rt foot surgery    . Foot surgery    . Tubal ligation     Family History  Problem Relation Age of Onset  . Heart disease Mother   . Diabetes Mother   . Peripheral Artery Disease Father    History  Substance Use Topics  . Smoking status: Current Every Day Smoker -- 1.00 packs/day    Types: Cigarettes  . Smokeless tobacco: Not on file  . Alcohol Use: No   OB History   Grav Para Term Preterm Abortions TAB SAB Ect Mult Living                 Review of Systems  Constitutional: Negative for chills and activity change.       All ROS Neg except as noted in HPI  HENT: Negative for nosebleeds.   Eyes: Negative for photophobia and discharge.  Respiratory: Negative for cough, shortness of breath and wheezing.   Cardiovascular: Negative for chest pain and palpitations.  Gastrointestinal: Negative for nausea, abdominal pain and blood in stool.    Genitourinary: Negative for dysuria, frequency and hematuria.  Musculoskeletal: Positive for neck pain. Negative for arthralgias and back pain.  Skin: Negative.   Neurological: Negative for dizziness, seizures, speech difficulty and numbness.  Psychiatric/Behavioral: Negative for hallucinations and confusion.    Allergies  Ancef; Aspirin; Ibuprofen; and Tramadol  Home Medications   Current Outpatient Rx  Name  Route  Sig  Dispense  Refill  . dexamethasone (DECADRON) 4 MG tablet      1 po bid with food   12 tablet   0   . HYDROcodone-acetaminophen (NORCO) 5-325 MG per tablet   Oral   Take 1 tablet by mouth every 6 (six) hours as needed for moderate pain.   15 tablet   0   . methocarbamol (ROBAXIN) 500 MG tablet   Oral   Take 1 tablet (500 mg total) by mouth 3 (three) times daily.   21 tablet   0    BP 156/90  Pulse 96  Temp(Src) 98.3 F (36.8 C)  Resp 18  SpO2 100%  LMP 08/18/2013 Physical Exam  Nursing note and vitals reviewed. Constitutional: She is oriented to person, place, and time. She appears well-developed and well-nourished.  Non-toxic appearance.  HENT:  Head: Normocephalic.  Right Ear: Tympanic membrane and external ear normal.  Left Ear: Tympanic membrane and external ear normal.  Eyes: EOM and lids are  normal. Pupils are equal, round, and reactive to light.  Neck: Normal range of motion. Neck supple. Carotid bruit is not present.  Cardiovascular: Normal rate, regular rhythm, normal heart sounds, intact distal pulses and normal pulses.   Pulmonary/Chest: Breath sounds normal. No respiratory distress.  Abdominal: Soft. Bowel sounds are normal. There is no tenderness. There is no guarding.  Musculoskeletal:       Arms: Lymphadenopathy:       Head (right side): No submandibular adenopathy present.       Head (left side): No submandibular adenopathy present.    She has no cervical adenopathy.  Neurological: She is alert and oriented to person, place,  and time. She has normal strength. No cranial nerve deficit or sensory deficit.  Skin: Skin is warm and dry.  Psychiatric: She has a normal mood and affect. Her speech is normal.    ED Course  Procedures (including critical care time) Labs Review Labs Reviewed - No data to display Imaging Review No results found.  EKG Interpretation   None       MDM   1. Right shoulder strain    **I have reviewed nursing notes, vital signs, and all appropriate lab and imaging results for this patient.*  Patient was attempting to help move and entertainment center up some stairs when she caught the entertainment center in oral position and injured the right shoulder and neck area. The patient states this happened on last evening. She's been having pain in her shoulder and neck since that time.  Examination reveals no hematoma. There is no neurovascular compromise. 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 Patient advised to apply ice for the first 48 hours, then heat. Patient is given a prescription for Decadron, Norco, and Robaxin. She is placed in a sling. He is to see Dr. Hilda Lias for additional evaluation if not improving.  Kathie Dike, PA-C 08/23/13 1534

## 2013-08-23 NOTE — Discharge Instructions (Signed)
Please apply heat to your shoulder for comfort. Please use the arm sling for the next 5-7 days. Please use Robaxin 3 times daily. Decadron 2 times daily with food. Norco every 4 hours for pain. Please see the orthopedic physician listed above if not improving.

## 2013-11-26 ENCOUNTER — Encounter (HOSPITAL_COMMUNITY): Payer: Self-pay | Admitting: Emergency Medicine

## 2013-11-26 ENCOUNTER — Emergency Department (HOSPITAL_COMMUNITY)
Admission: EM | Admit: 2013-11-26 | Discharge: 2013-11-26 | Disposition: A | Discharge status: Home / Self Care | Payer: Self-pay | Attending: Emergency Medicine | Admitting: Emergency Medicine

## 2013-11-26 DIAGNOSIS — Z8659 Personal history of other mental and behavioral disorders: Secondary | ICD-10-CM | POA: Insufficient documentation

## 2013-11-26 DIAGNOSIS — S20229A Contusion of unspecified back wall of thorax, initial encounter: Secondary | ICD-10-CM | POA: Insufficient documentation

## 2013-11-26 DIAGNOSIS — S71151A Open bite, right thigh, initial encounter: Secondary | ICD-10-CM

## 2013-11-26 DIAGNOSIS — Z8639 Personal history of other endocrine, nutritional and metabolic disease: Secondary | ICD-10-CM | POA: Insufficient documentation

## 2013-11-26 DIAGNOSIS — Y9389 Activity, other specified: Secondary | ICD-10-CM | POA: Insufficient documentation

## 2013-11-26 DIAGNOSIS — S335XXA Sprain of ligaments of lumbar spine, initial encounter: Secondary | ICD-10-CM | POA: Insufficient documentation

## 2013-11-26 DIAGNOSIS — Z8669 Personal history of other diseases of the nervous system and sense organs: Secondary | ICD-10-CM | POA: Insufficient documentation

## 2013-11-26 DIAGNOSIS — Z8619 Personal history of other infectious and parasitic diseases: Secondary | ICD-10-CM | POA: Insufficient documentation

## 2013-11-26 DIAGNOSIS — Z87442 Personal history of urinary calculi: Secondary | ICD-10-CM | POA: Insufficient documentation

## 2013-11-26 DIAGNOSIS — Z87891 Personal history of nicotine dependence: Secondary | ICD-10-CM | POA: Insufficient documentation

## 2013-11-26 DIAGNOSIS — S39012A Strain of muscle, fascia and tendon of lower back, initial encounter: Secondary | ICD-10-CM

## 2013-11-26 DIAGNOSIS — Z8719 Personal history of other diseases of the digestive system: Secondary | ICD-10-CM | POA: Insufficient documentation

## 2013-11-26 DIAGNOSIS — I10 Essential (primary) hypertension: Secondary | ICD-10-CM | POA: Insufficient documentation

## 2013-11-26 DIAGNOSIS — W540XXA Bitten by dog, initial encounter: Secondary | ICD-10-CM | POA: Insufficient documentation

## 2013-11-26 DIAGNOSIS — Z862 Personal history of diseases of the blood and blood-forming organs and certain disorders involving the immune mechanism: Secondary | ICD-10-CM | POA: Insufficient documentation

## 2013-11-26 DIAGNOSIS — Z23 Encounter for immunization: Secondary | ICD-10-CM | POA: Insufficient documentation

## 2013-11-26 DIAGNOSIS — S300XXA Contusion of lower back and pelvis, initial encounter: Secondary | ICD-10-CM

## 2013-11-26 DIAGNOSIS — Y92009 Unspecified place in unspecified non-institutional (private) residence as the place of occurrence of the external cause: Secondary | ICD-10-CM | POA: Insufficient documentation

## 2013-11-26 DIAGNOSIS — IMO0002 Reserved for concepts with insufficient information to code with codable children: Secondary | ICD-10-CM | POA: Insufficient documentation

## 2013-11-26 MED ORDER — HYDROCODONE-ACETAMINOPHEN 5-325 MG PO TABS
2.0000 | ORAL_TABLET | Freq: Once | ORAL | Status: AC
  Administered 2013-11-26: 2 via ORAL
  Filled 2013-11-26: qty 2

## 2013-11-26 MED ORDER — AMOXICILLIN-POT CLAVULANATE 875-125 MG PO TABS
1.0000 | ORAL_TABLET | Freq: Once | ORAL | Status: AC
  Administered 2013-11-26: 1 via ORAL
  Filled 2013-11-26: qty 1

## 2013-11-26 MED ORDER — MINOCYCLINE HCL 100 MG PO CAPS: 100.0000 mg | ORAL_CAPSULE | Freq: Two times a day (BID) | ORAL | Status: DC

## 2013-11-26 MED ORDER — HYDROCODONE-ACETAMINOPHEN 5-325 MG PO TABS: 1.0000 | ORAL_TABLET | ORAL | Status: DC | PRN

## 2013-11-26 MED ORDER — TETANUS-DIPHTH-ACELL PERTUSSIS 5-2.5-18.5 LF-MCG/0.5 IM SUSP
0.5000 mL | Freq: Once | INTRAMUSCULAR | Status: AC
  Administered 2013-11-26: 0.5 mL via INTRAMUSCULAR
  Filled 2013-11-26: qty 0.5

## 2013-11-26 MED ORDER — DIAZEPAM 5 MG PO TABS
5.0000 mg | ORAL_TABLET | Freq: Once | ORAL | Status: AC
  Administered 2013-11-26: 5 mg via ORAL
  Filled 2013-11-26: qty 1

## 2013-11-26 MED ORDER — ONDANSETRON HCL 4 MG PO TABS
4.0000 mg | ORAL_TABLET | Freq: Once | ORAL | Status: AC
  Administered 2013-11-26: 4 mg via ORAL
  Filled 2013-11-26: qty 1

## 2013-11-26 MED ORDER — METHOCARBAMOL 500 MG PO TABS: 500.0000 mg | ORAL_TABLET | Freq: Three times a day (TID) | ORAL | Status: DC

## 2013-11-26 MED ORDER — AMOXICILLIN-POT CLAVULANATE 875-125 MG PO TABS: 1.0000 | ORAL_TABLET | Freq: Two times a day (BID) | ORAL | Status: DC

## 2013-11-26 NOTE — ED Provider Notes (Signed)
CSN: [id number]     Arrival date & time 11/26/13  1212 History  This chart was scribed for Ivery QualeHobson Consandra Laske, PA, working with Juliet RudeNathan R. Rubin PayorPickering, MD by Ardelia Memsylan Malpass, ED Scribe. This patient was seen in room APFT22/APFT22 and the patient's care was started at 1:15 AM.    Chief Complaint  Patient presents with  . Animal Bite    Patient is a 38 y.o. female presenting with animal bite. The history is provided by the patient. No language interpreter was used.  Animal Bite Contact animal:  Dog (pit bull) Location:  Leg Leg injury location:  R upper leg and R lower leg Time since incident:  7 hours Pain details:    Quality:  Unable to specify   Severity:  Mild   Timing:  Constant   Progression:  Unchanged Incident location:  Another residence Provoked: unprovoked   Notifications:  Animal control and law enforcement Animal's rabies vaccination status:  Never received Animal in possession: yes   Tetanus status:  Unknown Relieved by:  None tried Worsened by:  Nothing tried Ineffective treatments:  None tried   HPI Comments: Mary Jarvis is a 38 y.o. female who presents to the Emergency Department complaining of an animal bite that occurred this morning, around 7 hours ago. She states that a relative's pit bull attacked her (unprovoked) when she went to the relative's home. She states that she was bitten on her right medial thigh. There is an area of bruising and a mild abrasion to her thigh. She states that the dog jumped on her, which caused her to fall and hit the back of her head and her lower back. She is complaining of mild pain described as soreness in her occipital head and lower back.  She states that animal control and Police have been notified. She states that the dog has not had any vaccinations. She states that she is unsure if her Tetanus vaccinations are UTD. She denies any history of bleeding disorder. She states that she is not on any blood thinning medications. She states that  she is not allergic to any antibiotics.    Past Medical History  Diagnosis Date  . Hypertension   . Gastric ulcer   . Hepatitis C   . Hyperlipidemia   . Liver failure   . Tachycardia   . Generalized anxiety disorder   . Peripheral neuropathy   . Kidney stones    Past Surgical History  Procedure Laterality Date  . Rt foot surgery    . Foot surgery    . Tubal ligation     Family History  Problem Relation Age of Onset  . Heart disease Mother   . Diabetes Mother   . Peripheral Artery Disease Father    History  Substance Use Topics  . Smoking status: Former Smoker -- 1.00 packs/day    Types: Cigarettes  . Smokeless tobacco: Not on file  . Alcohol Use: No   OB History   Grav Para Term Preterm Abortions TAB SAB Ect Mult Living                 Review of Systems  Musculoskeletal: Positive for back pain.  Skin: Positive for wound.  Neurological: Positive for headaches.  Hematological: Does not bruise/bleed easily.  All other systems reviewed and are negative.   Allergies  Ancef; Aspirin; Ibuprofen; and Tramadol  Home Medications   Prior to Admission medications   Medication Sig Start Date End Date Taking? Authorizing Provider  dexamethasone (  DECADRON) 4 MG tablet 1 po bid with food 08/23/13   Kathie Dike, PA-C  HYDROcodone-acetaminophen (NORCO) 5-325 MG per tablet Take 1 tablet by mouth every 6 (six) hours as needed for moderate pain. 08/23/13   Kathie Dike, PA-C  methocarbamol (ROBAXIN) 500 MG tablet Take 1 tablet (500 mg total) by mouth 3 (three) times daily. 08/23/13   Kathie Dike, PA-C   Triage Vitals: BP 154/93  Pulse 93  Temp(Src) 97.7 F (36.5 C) (Oral)  Resp 18  Ht 5\' 1"  (1.549 m)  Wt 182 lb (82.555 kg)  BMI 34.41 kg/m2  SpO2 100%  LMP 11/26/2013  Physical Exam  Nursing note and vitals reviewed. Constitutional: She is oriented to person, place, and time. She appears well-developed and well-nourished. No distress.  HENT:  Head:  Normocephalic and atraumatic.  Face is symmetrical. No evidence of any trauma. Ears symmetrical, no evidence of any trauma.  Eyes: EOM are normal.  Neck: Normal range of motion. Neck supple. No tracheal deviation present.  Full ROM of the neck, no evidence of trauma to the neck  Cardiovascular: Normal rate and regular rhythm.  Exam reveals no friction rub.   No murmur heard. Pulmonary/Chest: Effort normal and breath sounds normal. No respiratory distress. She has no wheezes. She has no rales.  Lungs CTA. Symmetrical rise and fall of the chest.  Musculoskeletal: Normal range of motion.  No trauma of the upper extremities. 10 cm x 5.5 cm bruise to inner aspect of right thigh. Small denuded area at the top of the bruise. No satellite hematoma. No other bruise or trauma to the lower extremity. No palpable step-off of the lower spine. No paraspinal hematoma palpated.  Neurological: She is alert and oriented to person, place, and time.  Skin: Skin is warm and dry.  3 long scratches to the lower thoracic and upper lumbar region.  Psychiatric: She has a normal mood and affect. Her behavior is normal.    ED Course  Procedures (including critical care time)  DIAGNOSTIC STUDIES: Oxygen Saturation is 100% on RA, normal by my interpretation.    COORDINATION OF CARE: 1:20 PM- Will update pt's Tetanus vaccination and order other medications in the the ED. Discussed plan to discharge with Augmentin, Norco and Robaxin. Pt advised of plan for treatment and pt agrees.  Medications  Tdap (BOOSTRIX) injection 0.5 mL (0.5 mLs Intramuscular Given 11/26/13 1342)  amoxicillin-clavulanate (AUGMENTIN) 875-125 MG per tablet 1 tablet (1 tablet Oral Given 11/26/13 1349)  diazepam (VALIUM) tablet 5 mg (5 mg Oral Given 11/26/13 1350)  HYDROcodone-acetaminophen (NORCO/VICODIN) 5-325 MG per tablet 2 tablet (2 tablets Oral Given 11/26/13 1350)  ondansetron (ZOFRAN) tablet 4 mg (4 mg Oral Given 11/26/13 1350)   Labs Review Labs  Reviewed - No data to display  Imaging Review No results found.   EKG Interpretation None      MDM Patient sustained a dog bite to the right thigh. Animal control has been called, the police also been notified. The animal control will be observing the animal. The patient's tetanus was updated. The patient sustained injury to her lower back, and this will be treated with Robaxin and Norco. The dog bite will be treated with Augmentin. Patient given instructions to return if any signs of progression of infection.    Final diagnoses:  Animal bite of right thigh  Contusion of lower back  Lumbar strain    **I have reviewed nursing notes, vital signs, and all appropriate lab and imaging  results for this patient.* 3:11 PM. The patient returned to the emergency department with her prescription for Augmentin and Norco torn in half. The patient states that she cannot afford the Augmentin. The pharmacist reports that the patient requested to have the pain medication along, without the antibiotic.  I have instructed to the patient on not deforming or displacing prescriptions given. A new prescription for minocycline and Norco have been given to the patient at her request.  **I personally performed the services described in this documentation, which was scribed in my presence. The recorded information has been reviewed and is accurate.Kathie Dike*  Sinan Tuch M Deeya Richeson, PA-C 11/26/13 1514

## 2013-11-26 NOTE — ED Notes (Signed)
Pt was visiting friend this morning when she was attacked by friends' pit bull. During attack, pt was knocked down 3 steps and has some linear scratches noted to backside. Per pt, dog then bit her on rt inner thigh area. Pt has bruising to rt inner thigh.

## 2013-11-26 NOTE — ED Notes (Signed)
Pt says was attacked by her cousins dog.  Reports animal control aware and has the dog.  Reports unknown if dog has had rabies shots or not.   Pt says was bit on inner left thigh and has scratches on lower back.

## 2013-11-26 NOTE — Discharge Instructions (Signed)
Please cleanse the bite area with soap and water daily. May apply a Band-Aid to the area to protect it from closing and or dirt and dust. Use Augmentin 2 times daily with food. Robaxin and Norco may cause drowsiness, please use with caution. Please see Dr. Christell ConstantMoore for additional evaluation and management of your wound and back pain. Please Loraine LericheMark your medical records that your tetanus was updated today. Animal Bite An animal bite can result in a scratch on the skin, deep open cut, puncture of the skin, crush injury, or tearing away of the skin or a body part. Dogs are responsible for most animal bites. Children are bitten more often than adults. An animal bite can range from very mild to more serious. A small bite from your house pet is no cause for alarm. However, some animal bites can become infected or injure a bone or other tissue. You must seek medical care if:  The skin is broken and bleeding does not slow down or stop after 15 minutes.  The puncture is deep and difficult to clean (such as a cat bite).  Pain, warmth, redness, or pus develops around the wound.  The bite is from a stray animal or rodent. There may be a risk of rabies infection.  The bite is from a snake, raccoon, skunk, fox, coyote, or bat. There may be a risk of rabies infection.  The person bitten has a chronic illness such as diabetes, liver disease, or cancer, or the person takes medicine that lowers the immune system.  There is concern about the location and severity of the bite. It is important to clean and protect an animal bite wound right away to prevent infection. Follow these steps:  Clean the wound with plenty of water and soap.  Apply an antibiotic cream.  Apply gentle pressure over the wound with a clean towel or gauze to slow or stop bleeding.  Elevate the affected area above the heart to help stop any bleeding.  Seek medical care. Getting medical care within 8 hours of the animal bite leads to the best  possible outcome. DIAGNOSIS  Your caregiver will most likely:  Take a detailed history of the animal and the bite injury.  Perform a wound exam.  Take your medical history. Blood tests or X-rays may be performed. Sometimes, infected bite wounds are cultured and sent to a lab to identify the infectious bacteria.  TREATMENT  Medical treatment will depend on the location and type of animal bite as well as the patient's medical history. Treatment may include:  Wound care, such as cleaning and flushing the wound with saline solution, bandaging, and elevating the affected area.  Antibiotics.  Tetanus immunization.  Rabies immunization.  Leaving the wound open to heal. This is often done with animal bites, due to the high risk of infection. However, in certain cases, wound closure with stitches, wound adhesive, skin adhesive strips, or staples may be used. Infected bites that are left untreated may require intravenous (IV) antibiotics and surgical treatment in the hospital. HOME CARE INSTRUCTIONS  Follow your caregiver's instructions for wound care.  Take all medicines as directed.  If your caregiver prescribes antibiotics, take them as directed. Finish them even if you start to feel better.  Follow up with your caregiver for further exams or immunizations as directed. You may need a tetanus shot if:  You cannot remember when you had your last tetanus shot.  You have never had a tetanus shot.  The injury broke  your skin. If you get a tetanus shot, your arm may swell, get red, and feel warm to the touch. This is common and not a problem. If you need a tetanus shot and you choose not to have one, there is a rare chance of getting tetanus. Sickness from tetanus can be serious. SEEK MEDICAL CARE IF:  You notice warmth, redness, soreness, swelling, pus discharge, or a bad smell coming from the wound.  You have a red line on the skin coming from the wound.  You have a fever, chills,  or a general ill feeling.  You have nausea or vomiting.  You have continued or worsening pain.  You have trouble moving the injured part.  You have other questions or concerns. MAKE SURE YOU:  Understand these instructions.  Will watch your condition.  Will get help right away if you are not doing well or get worse. Document Released: 03/31/2011 Document Revised: 10/05/2011 Document Reviewed: 03/31/2011 [facility] Patient Information 2014 [place], [place].  Muscle Strain A muscle strain (pulled muscle) happens when a muscle is stretched beyond normal length. It happens when a sudden, violent force stretches your muscle too far. Usually, a few of the fibers in your muscle are torn. Muscle strain is common in athletes. Recovery usually takes 1 2 weeks. Complete healing takes 5 6 weeks.  HOME CARE   Follow the PRICE method of treatment to help your injury get better. Do this the first 2 3 days after the injury:  Protect. Protect the muscle to keep it from getting injured again.  Rest. Limit your activity and rest the injured body part.  Ice. Put ice in a plastic bag. Place a towel between your skin and the bag. Then, apply the ice and leave it on from 15 20 minutes each hour. After the third day, switch to moist heat packs.  Compression. Use a splint or elastic bandage on the injured area for comfort. Do not put it on too tightly.  Elevate. Keep the injured body part above the level of your heart.  Only take medicine as told by your doctor.  Warm up before doing exercise to prevent future muscle strains. GET HELP IF:   You have more pain or puffiness (swelling) in the injured area.  You feel numbness, tingling, or notice a loss of strength in the injured area. MAKE SURE YOU:   Understand these instructions.  Will watch your condition.  Will get help right away if you are not doing well or get worse. Document Released: 04/21/2008 Document Revised: 05/03/2013 Document  Reviewed: 02/09/2013 [facility] Patient Information 2014 [place], [place].

## 2013-11-28 NOTE — ED Provider Notes (Signed)
Medical screening examination/treatment/procedure(s) were performed by non-physician practitioner and as supervising physician I was immediately available for consultation/collaboration.   EKG Interpretation None       Reia Viernes R. Nomie Buchberger, MD 11/28/13 1204 

## 2014-04-10 ENCOUNTER — Emergency Department (HOSPITAL_COMMUNITY): Payer: Self-pay

## 2014-04-10 ENCOUNTER — Encounter (HOSPITAL_COMMUNITY): Payer: Self-pay | Admitting: Emergency Medicine

## 2014-04-10 ENCOUNTER — Emergency Department (HOSPITAL_COMMUNITY)
Admission: EM | Admit: 2014-04-10 | Discharge: 2014-04-10 | Disposition: A | Discharge status: Home / Self Care | Payer: Self-pay | Attending: Emergency Medicine | Admitting: Emergency Medicine

## 2014-04-10 DIAGNOSIS — S66901A Unspecified injury of unspecified muscle, fascia and tendon at wrist and hand level, right hand, initial encounter: Secondary | ICD-10-CM

## 2014-04-10 DIAGNOSIS — G609 Hereditary and idiopathic neuropathy, unspecified: Secondary | ICD-10-CM | POA: Insufficient documentation

## 2014-04-10 DIAGNOSIS — Z8639 Personal history of other endocrine, nutritional and metabolic disease: Secondary | ICD-10-CM | POA: Insufficient documentation

## 2014-04-10 DIAGNOSIS — IMO0002 Reserved for concepts with insufficient information to code with codable children: Secondary | ICD-10-CM | POA: Insufficient documentation

## 2014-04-10 DIAGNOSIS — Y92009 Unspecified place in unspecified non-institutional (private) residence as the place of occurrence of the external cause: Secondary | ICD-10-CM | POA: Insufficient documentation

## 2014-04-10 DIAGNOSIS — Z792 Long term (current) use of antibiotics: Secondary | ICD-10-CM | POA: Insufficient documentation

## 2014-04-10 DIAGNOSIS — Z862 Personal history of diseases of the blood and blood-forming organs and certain disorders involving the immune mechanism: Secondary | ICD-10-CM | POA: Insufficient documentation

## 2014-04-10 DIAGNOSIS — Z8659 Personal history of other mental and behavioral disorders: Secondary | ICD-10-CM | POA: Insufficient documentation

## 2014-04-10 DIAGNOSIS — F17200 Nicotine dependence, unspecified, uncomplicated: Secondary | ICD-10-CM | POA: Insufficient documentation

## 2014-04-10 DIAGNOSIS — Z8719 Personal history of other diseases of the digestive system: Secondary | ICD-10-CM | POA: Insufficient documentation

## 2014-04-10 DIAGNOSIS — Z8619 Personal history of other infectious and parasitic diseases: Secondary | ICD-10-CM | POA: Insufficient documentation

## 2014-04-10 DIAGNOSIS — W230XXA Caught, crushed, jammed, or pinched between moving objects, initial encounter: Secondary | ICD-10-CM | POA: Insufficient documentation

## 2014-04-10 DIAGNOSIS — S6990XA Unspecified injury of unspecified wrist, hand and finger(s), initial encounter: Principal | ICD-10-CM | POA: Insufficient documentation

## 2014-04-10 DIAGNOSIS — S6980XA Other specified injuries of unspecified wrist, hand and finger(s), initial encounter: Secondary | ICD-10-CM | POA: Insufficient documentation

## 2014-04-10 DIAGNOSIS — Y9389 Activity, other specified: Secondary | ICD-10-CM | POA: Insufficient documentation

## 2014-04-10 DIAGNOSIS — R000 Tachycardia, unspecified: Secondary | ICD-10-CM | POA: Insufficient documentation

## 2014-04-10 DIAGNOSIS — Z87442 Personal history of urinary calculi: Secondary | ICD-10-CM | POA: Insufficient documentation

## 2014-04-10 DIAGNOSIS — I10 Essential (primary) hypertension: Secondary | ICD-10-CM | POA: Insufficient documentation

## 2014-04-10 MED ORDER — HYDROCODONE-ACETAMINOPHEN 5-325 MG PO TABS
1.0000 | ORAL_TABLET | Freq: Once | ORAL | Status: AC
Start: 2014-04-10 — End: 2014-04-10
  Administered 2014-04-10: 1 via ORAL
  Filled 2014-04-10: qty 1

## 2014-04-10 MED ORDER — HYDROCODONE-ACETAMINOPHEN 5-325 MG PO TABS: 1.0000 | ORAL_TABLET | ORAL | Status: DC | PRN

## 2014-04-10 NOTE — Discharge Instructions (Signed)
Go to Dr. Carlos Levering office tomorrow at [facility] for follow up. Do not take the narcotic if you are driving because it will make you sleepy. Elevate the hand, apply ice.

## 2014-04-10 NOTE — ED Notes (Signed)
Hurt finger working in yard.  Joint keeps popping out of place.  Have not taken any medication today.

## 2014-04-10 NOTE — ED Notes (Signed)
PT c/o right hand middle digit was jammed around 2 days ago. PT c/o pain to right hand and swelling to fingers.

## 2014-04-10 NOTE — ED Provider Notes (Signed)
CSN: [id number]     Arrival date & time 04/10/14  1709 History   First MD Initiated Contact with Patient 04/10/14 1808     Chief Complaint  Patient presents with  . Hand Pain     (Consider location/radiation/quality/duration/timing/severity/associated sxs/prior Treatment) Patient is a 38 y.o. female presenting with hand pain. The history is provided by the patient.  Hand Pain This is a new problem. The current episode started in the past 7 days. The problem occurs constantly. The problem has been unchanged. Exacerbated by: movement of the middle finger right.   Mary Jarvis is a 38 y.o. female who presents to the ED with right hand pain. She states that she jammed her middle right finger 2 days ago and it has continued to have swelling and pain. She was working in her yard where they are making a fish pond and she used her hands to try and pull out a rock and jammed her finger into the rock. She can make a fist but then she can not straighten her middle finger unless she uses her other hand to do it.   Past Medical History  Diagnosis Date  . Hypertension   . Gastric ulcer   . Hepatitis C   . Hyperlipidemia   . Liver failure   . Tachycardia   . Generalized anxiety disorder   . Peripheral neuropathy   . Kidney stones    Past Surgical History  Procedure Laterality Date  . Rt foot surgery    . Foot surgery    . Tubal ligation     Family History  Problem Relation Age of Onset  . Heart disease Mother   . Diabetes Mother   . Peripheral Artery Disease Father    History  Substance Use Topics  . Smoking status: Current Every Day Smoker -- 1.00 packs/day    Types: Cigarettes  . Smokeless tobacco: Not on file  . Alcohol Use: No   OB History   Grav Para Term Preterm Abortions TAB SAB Ect Mult Living                 Review of Systems Negative except as stated in HPI   Allergies  Ancef; Aspirin; Ibuprofen; and Tramadol  Home Medications   Prior to Admission medications    Medication Sig Start Date End Date Taking? Authorizing Provider  amoxicillin-clavulanate (AUGMENTIN) 875-125 MG per tablet Take 1 tablet by mouth every 12 (twelve) hours. 11/26/13   Kathie Dike, PA-C  dexamethasone (DECADRON) 4 MG tablet 1 po bid with food 08/23/13   Kathie Dike, PA-C  HYDROcodone-acetaminophen (NORCO) 5-325 MG per tablet Take 1 tablet by mouth every 6 (six) hours as needed for moderate pain. 08/23/13   Kathie Dike, PA-C  HYDROcodone-acetaminophen (NORCO/VICODIN) 5-325 MG per tablet Take 1 tablet by mouth every 4 (four) hours as needed for moderate pain. 11/26/13   Kathie Dike, PA-C  HYDROcodone-acetaminophen (NORCO/VICODIN) 5-325 MG per tablet Take 1 tablet by mouth every 4 (four) hours as needed for moderate pain. 11/26/13   Kathie Dike, PA-C  methocarbamol (ROBAXIN) 500 MG tablet Take 1 tablet (500 mg total) by mouth 3 (three) times daily. 08/23/13   Kathie Dike, PA-C  methocarbamol (ROBAXIN) 500 MG tablet Take 1 tablet (500 mg total) by mouth 3 (three) times daily. 11/26/13   Kathie Dike, PA-C  minocycline (MINOCIN) 100 MG capsule Take 1 capsule (100 mg total) by mouth 2 (two) times daily. 11/26/13  Kathie Dike, PA-C   BP 145/94  Pulse 89  Temp(Src) 98.1 F (36.7 C) (Oral)  Resp 18  Ht  (1.6 m)  Wt 170 lb (77.111 kg)  BMI 30.12 kg/m2  SpO2 100%  LMP 03/27/2014 Physical Exam  Nursing note and vitals reviewed. Constitutional: She is oriented to person, place, and time. She appears well-developed and well-nourished.  HENT:  Head: Normocephalic.  Eyes: EOM are normal.  Neck: Neck supple.  Cardiovascular: Normal rate.   Pulmonary/Chest: Effort normal.  Musculoskeletal:       Right hand: She exhibits decreased range of motion, tenderness and swelling. She exhibits normal capillary refill and no laceration. Normal sensation noted. Decreased strength noted.       Hands: Right middle finger with mild swelling, there is tenderness. When the  patient makes a fist and then tries to extend her middle finger there is a catch in the PIP that prevents her from extension. When the finger is extended by the examiner the patient is able to keep the finger extended until she bends it again. Radial pulses equal, adequate circulation, good touch sensation.   Neurological: She is alert and oriented to person, place, and time. No cranial nerve deficit.  Skin: Skin is warm and dry.  Psychiatric: She has a normal mood and affect. Her behavior is normal.    ED Course  Procedures (including critical care time) Dr. Deretha Emory in to examine the patient.  Labs Review Labs Reviewed - No data to display  Imaging Review Dg Hand Complete Right  04/10/2014   CLINICAL DATA:  Injury  EXAM: RIGHT HAND - COMPLETE 3+ VIEW  COMPARISON:  None.  FINDINGS: There is no evidence of fracture or dislocation. There is no evidence of arthropathy or other focal bone abnormality. Soft tissues are unremarkable.  IMPRESSION: Negative.   Electronically Signed   By: Maryclare Bean M.D.   On: 04/10/2014 17:49   Consult with Dr. Amanda Pea and will splint finger in extension and have her f/u in the office tomorrow at 4pm.   MDM  38 y.o. female with tendon injury to the right middle finger. Finger splinted, pain management, ice and follow up with hand tomorrow. Stable for discharge without neurovascular compromise.      [facility] Mary Jarvis, [place] 04/11/14 [phone]

## 2014-04-10 NOTE — ED Provider Notes (Signed)
Medical screening examination/treatment/procedure(s) were conducted as a shared visit with non-physician practitioner(s) and myself.  I personally evaluated the patient during the encounter.   EKG Interpretation None     Patient seen by me. Patient with injury to her right middle finger that's her dominant hand it was jammed into a big rock 2 days ago. Patient has pain at the proximal joint of the fifth phalanx. No significant swelling Refill is 1-2 seconds sensations intact patient has flexion with some discomfort at that joint and at times can extend it almost completely but other times she feels a pop and something gets stuck around the joint area and she has difficulty fully extending the finger she can usually get on stuck by flexing again. The symptoms are suggestive of something getting stuck in the joint. Does not seem to fit a complete dorsal would tear or complete tear of the extensor tendon. Patient will be splinted in the position of function and referred to hand surgery.  Mary Mulders, MD 04/10/14 [phone]

## 2014-04-15 ENCOUNTER — Emergency Department (HOSPITAL_COMMUNITY)
Admission: EM | Admit: 2014-04-15 | Discharge: 2014-04-15 | Disposition: A | Discharge status: Home / Self Care | Payer: No Typology Code available for payment source

## 2014-04-15 ENCOUNTER — Encounter (HOSPITAL_COMMUNITY): Payer: Self-pay | Admitting: Emergency Medicine

## 2014-04-15 DIAGNOSIS — M24241 Disorder of ligament, right hand: Secondary | ICD-10-CM

## 2014-04-15 DIAGNOSIS — Z8659 Personal history of other mental and behavioral disorders: Secondary | ICD-10-CM | POA: Insufficient documentation

## 2014-04-15 DIAGNOSIS — Z8639 Personal history of other endocrine, nutritional and metabolic disease: Secondary | ICD-10-CM | POA: Insufficient documentation

## 2014-04-15 DIAGNOSIS — M79609 Pain in unspecified limb: Secondary | ICD-10-CM | POA: Insufficient documentation

## 2014-04-15 DIAGNOSIS — F17200 Nicotine dependence, unspecified, uncomplicated: Secondary | ICD-10-CM | POA: Insufficient documentation

## 2014-04-15 DIAGNOSIS — M24849 Other specific joint derangements of unspecified hand, not elsewhere classified: Secondary | ICD-10-CM | POA: Insufficient documentation

## 2014-04-15 DIAGNOSIS — Z8619 Personal history of other infectious and parasitic diseases: Secondary | ICD-10-CM | POA: Insufficient documentation

## 2014-04-15 DIAGNOSIS — Z862 Personal history of diseases of the blood and blood-forming organs and certain disorders involving the immune mechanism: Secondary | ICD-10-CM | POA: Insufficient documentation

## 2014-04-15 DIAGNOSIS — IMO0002 Reserved for concepts with insufficient information to code with codable children: Secondary | ICD-10-CM | POA: Insufficient documentation

## 2014-04-15 DIAGNOSIS — I10 Essential (primary) hypertension: Secondary | ICD-10-CM | POA: Insufficient documentation

## 2014-04-15 DIAGNOSIS — Z79899 Other long term (current) drug therapy: Secondary | ICD-10-CM | POA: Insufficient documentation

## 2014-04-15 DIAGNOSIS — Z8719 Personal history of other diseases of the digestive system: Secondary | ICD-10-CM | POA: Insufficient documentation

## 2014-04-15 DIAGNOSIS — R000 Tachycardia, unspecified: Secondary | ICD-10-CM | POA: Insufficient documentation

## 2014-04-15 DIAGNOSIS — Z87442 Personal history of urinary calculi: Secondary | ICD-10-CM | POA: Insufficient documentation

## 2014-04-15 DIAGNOSIS — G8921 Chronic pain due to trauma: Secondary | ICD-10-CM | POA: Insufficient documentation

## 2014-04-15 DIAGNOSIS — Z792 Long term (current) use of antibiotics: Secondary | ICD-10-CM | POA: Insufficient documentation

## 2014-04-15 MED ORDER — LIDOCAINE HCL 2 % IJ SOLN
5.0000 mL | Freq: Once | INTRAMUSCULAR | Status: AC
  Administered 2014-04-15: 100 mg via INTRADERMAL
  Filled 2014-04-15: qty 20

## 2014-04-15 NOTE — ED Notes (Signed)
The pt was digging in her garden and injured her  Rt middle finger.  She was seen at [facility] Wednesday and a splint was placed on her finger.  Each time she removes  Her finger splint the finger dislocates.  She is supposed to meet dr Amanda Pea here.

## 2014-04-15 NOTE — Consult Note (Signed)
Reason for Consult: Middle finger injury 1 week ago Referring Physician: ER staff  Mary Jarvis is an 38 y.o. female.  HPI: Patient presents 1 week out from a injury to her right middle finger.  The patient states that she was working in a fish pond and jammed her finger. Since that time she's noticed a click with hyperextension. She's tried a brace.  She notes no locking popping catching  She notes no history of instability dystrophy or infection.  I discussed all issues with her at length and the findings.  At present juncture the patient has a hyper extension-type posture about the middle finger which is the area in question.  She notes no other injury.  She has history of hepatitis C and has been treated for this.  She has a history of MRSA but no history of problems with the finger per se  I discussed her all issues.  Her husband is here.    Past Medical History  Diagnosis Date  . Hypertension   . Gastric ulcer   . Hepatitis C   . Hyperlipidemia   . Liver failure   . Tachycardia   . Generalized anxiety disorder   . Peripheral neuropathy   . Kidney stones     Past Surgical History  Procedure Laterality Date  . Rt foot surgery    . Foot surgery    . Tubal ligation      Family History  Problem Relation Age of Onset  . Heart disease Mother   . Diabetes Mother   . Peripheral Artery Disease Father     Social History:  reports that she has been smoking Cigarettes.  She has been smoking about 1.00 pack per day. She does not have any smokeless tobacco history on file. She reports that she uses illicit drugs (Marijuana). She reports that she does not drink alcohol.  Allergies:  Allergies  Allergen Reactions  . Aspirin Other (See Comments)    Cause bleeding have ulcers.  . Ibuprofen Other (See Comments)    Cause bleeding have ulcers  . Tramadol Nausea Only, Palpitations and Other (See Comments)    Cause bleeding have ulcers  . Ancef [Cefazolin] Hives     Medications: I have reviewed the patient's current medications.   No results found for this or any previous visit (from the past 48 hour(s)).  No results found.  ROS Blood pressure 127/90, pulse 107, temperature 98.2 F (36.8 C), temperature source Oral, resp. rate 16, last menstrual period 03/27/2014, SpO2 98.00%. Physical Exam hyperextension posture about the PIP joints of the hands bilaterally. No signs of infection. No signs of dystrophy. Patient has full sensation to the finger and normal refill. The middle finger has a bit more hyperextension posture than the adjacent fingers. She can flex the DIP joint PIP joints.  She is heavily toe contracting today on exam and do to this I discussed with her a block so that we can examine her.  She was given a intermetacarpal block following this I examined her. Her examination would reveal normal flexion and extension no locking popping catching instability or other problems. I pointed this out her.  The remaining aspects of her examination are benign.  HEENT stay normal limits.  Skin has multiple body tattoos.  Abdomen is obese nontender nondistended.  Chest is clear.  Heart regular rate.    Assessment/Plan: Right middle finger PIP hyperextension posture with mild swan deformity. No dislocation  or subluxation.  I have discussed the patient  that I feel her biggest issue is simply the ligamentous laxity that she demonstrates bilaterally in my opinion.  She has no swelling. There is no bruising. There is no evidence of advanced traumatic issue her features. I discussed her that is my feeling that she hyperextends and when she initiates flexion she feels a slight jump. We often see this in patients with a laxity in their volar plate. This can result as of injury or do to ligamentous laxity features.  I would recommend splinting her in flexion for 3 weeks and followup as the symptoms dictate.  She asked for pain medicine I  declined this.  Heat or ice should suffice and a taping regime.  I gave her multiple splints and Coban and an directions have a care for her hand.  She was vascularly intact at time of discharge and there were no complications.  I feel that a surgical stabilization for the PIP hyperextension would not be anything to jump toward unless she failed long-term splinting regime. A figure-of-eight splint which can be made would be an option as we often employed in our  rheumatoid arthritis population   All questions have been addressed encouraged and answered   Face-to-face time greater than 60 minutes was spent with this patient  Karen Chafe 04/15/2014, 10:02 PM

## 2014-04-15 NOTE — ED Notes (Signed)
Dr Gramig at bedside. 

## 2014-04-15 NOTE — ED Notes (Signed)
Pt stated Dr Amanda Pea told her to meet him at the ER at 7pm.  Dr. Amanda Pea pagged.

## 2014-04-15 NOTE — ED Provider Notes (Signed)
CSN: [id number]     Arrival date & time 04/15/14  1854 History   None    This chart was scribed for non-physician practitioner, Kerrie Buffalo NP working with No att. providers found by Arlan Organ, ED Scribe. This patient was seen in room TR06C/TR06C and the patient's care was started at 7:41 PM.   Chief Complaint  Patient presents with  . Finger Injury   The history is provided by the patient. No language interpreter was used.    HPI Comments: Mary Jarvis is a 38 y.o. female with a PMHx of HTN, Hyperlipidemia, Hepatitis C, and peripheral neuropathy who presents to the Emergency Department complaining of constant, moderate R middle finger pain x 12 days that is unchanged. Pt states she was digging in her garden several days ago when she jammed her finger against a rock while attempting to pull out the rock from her fish pond. [name] was seen 9/15 at [facility] and a splint was placed prior to discharge. Pt now states her finger continues to "dislocate" with each attempt to remove the splint from her finger. She was advised to come to the ED today at 7 PM to be seen by Dr. Amanda Pea.     Past Medical History  Diagnosis Date  . Hypertension   . Gastric ulcer   . Hepatitis C   . Hyperlipidemia   . Liver failure   . Tachycardia   . Generalized anxiety disorder   . Peripheral neuropathy   . Kidney stones    Past Surgical History  Procedure Laterality Date  . Rt foot surgery    . Foot surgery    . Tubal ligation     Family History  Problem Relation Age of Onset  . Heart disease Mother   . Diabetes Mother   . Peripheral Artery Disease Father    History  Substance Use Topics  . Smoking status: Current Every Day Smoker -- 1.00 packs/day    Types: Cigarettes  . Smokeless tobacco: Not on file  . Alcohol Use: No   OB History   Grav Para Term Preterm Abortions TAB SAB Ect Mult Living                 Review of Systems  Constitutional: Negative for fever and chills.   Musculoskeletal: Positive for arthralgias.       Middle finger injury   All other systems reviewed and are negative.     Allergies  Ancef; Aspirin; Ibuprofen; and Tramadol  Home Medications   Prior to Admission medications   Medication Sig Start Date End Date Taking? Authorizing Provider  amoxicillin-clavulanate (AUGMENTIN) 875-125 MG per tablet Take 1 tablet by mouth every 12 (twelve) hours. 11/26/13   Kathie Dike, PA-C  dexamethasone (DECADRON) 4 MG tablet 1 po bid with food 08/23/13   Kathie Dike, PA-C  HYDROcodone-acetaminophen (NORCO/VICODIN) 5-325 MG per tablet Take 1 tablet by mouth every 4 (four) hours as needed for moderate pain. 11/26/13   Kathie Dike, PA-C  HYDROcodone-acetaminophen (NORCO/VICODIN) 5-325 MG per tablet Take 1 tablet by mouth every 4 (four) hours as needed. 04/10/14   Azai Gaffin Orlene Och, NP  methocarbamol (ROBAXIN) 500 MG tablet Take 1 tablet (500 mg total) by mouth 3 (three) times daily. 08/23/13   Kathie Dike, PA-C  methocarbamol (ROBAXIN) 500 MG tablet Take 1 tablet (500 mg total) by mouth 3 (three) times daily. 11/26/13   Kathie Dike, PA-C  minocycline (MINOCIN) 100 MG capsule Take  1 capsule (100 mg total) by mouth 2 (two) times daily. 11/26/13   Kathie Dike, PA-C   Triage Vitals: BP 127/90  Pulse 107  Temp(Src) 98.2 F (36.8 C) (Oral)  Resp 16  SpO2 98%  LMP 03/27/2014   Physical Exam  Nursing note and vitals reviewed. Constitutional: She is oriented to person, place, and time. She appears well-developed and well-nourished.  HENT:  Head: Normocephalic.  Eyes: EOM are normal.  Neck: Normal range of motion.  Cardiovascular: Tachycardia present.   Pulmonary/Chest: Effort normal.  Musculoskeletal:  Right middle finger with splint in place. Adequate circulation.   Neurological: She is alert and oriented to person, place, and time.  Psychiatric: She has a normal mood and affect.    ED Course  Procedures (including critical care  time)  DIAGNOSTIC STUDIES: Oxygen Saturation is 98% on RA, Normal by my interpretation.    COORDINATION OF CARE: 7:41 PM-Discussed treatment plan with pt at bedside and pt agreed to plan.      MDM  38 y.o. female with tendon injury to the right middle finger one week ago. Stable to await Dr. Amanda Pea for further evaluation. Medical screening exam complete.   I personally performed the services described in this documentation, which was scribed in my presence. The recorded information has been reviewed and is accurate.    [facility] Orlene Och, [place] 04/15/14 1958

## 2014-04-15 NOTE — Discharge Instructions (Signed)
Please continue the splinting regime for 3 weeks. Please encourage flexion of your finger but do not extend past the confines of the splint.  You may remove your splint as Dr. Amanda Pea instructed for bathing and for flexion.  Once again our goal is to prevent hyperextension in your finger and allow the palmar tissue to tighten

## 2014-04-15 NOTE — ED Provider Notes (Signed)
Medical screening examination/treatment/procedure(s) were performed by non-physician practitioner and as supervising physician I was immediately available for consultation/collaboration.   EKG Interpretation None        Kristen N Ward, DO 04/15/14 2135 

## 2014-10-12 ENCOUNTER — Emergency Department (HOSPITAL_COMMUNITY)
Admission: EM | Admit: 2014-10-12 | Discharge: 2014-10-12 | Disposition: A | Discharge status: Home / Self Care | Payer: Medicaid Other | Attending: Emergency Medicine | Admitting: Emergency Medicine

## 2014-10-12 ENCOUNTER — Encounter (HOSPITAL_COMMUNITY): Payer: Self-pay | Admitting: *Deleted

## 2014-10-12 ENCOUNTER — Emergency Department (HOSPITAL_COMMUNITY): Payer: Medicaid Other

## 2014-10-12 DIAGNOSIS — Z8719 Personal history of other diseases of the digestive system: Secondary | ICD-10-CM | POA: Diagnosis not present

## 2014-10-12 DIAGNOSIS — S66302A Unspecified injury of extensor muscle, fascia and tendon of right middle finger at wrist and hand level, initial encounter: Secondary | ICD-10-CM | POA: Insufficient documentation

## 2014-10-12 DIAGNOSIS — Z87891 Personal history of nicotine dependence: Secondary | ICD-10-CM | POA: Diagnosis not present

## 2014-10-12 DIAGNOSIS — Z8669 Personal history of other diseases of the nervous system and sense organs: Secondary | ICD-10-CM | POA: Diagnosis not present

## 2014-10-12 DIAGNOSIS — Y998 Other external cause status: Secondary | ICD-10-CM | POA: Diagnosis not present

## 2014-10-12 DIAGNOSIS — Z8659 Personal history of other mental and behavioral disorders: Secondary | ICD-10-CM | POA: Insufficient documentation

## 2014-10-12 DIAGNOSIS — Z8619 Personal history of other infectious and parasitic diseases: Secondary | ICD-10-CM | POA: Insufficient documentation

## 2014-10-12 DIAGNOSIS — W231XXA Caught, crushed, jammed, or pinched between stationary objects, initial encounter: Secondary | ICD-10-CM | POA: Insufficient documentation

## 2014-10-12 DIAGNOSIS — S6991XA Unspecified injury of right wrist, hand and finger(s), initial encounter: Secondary | ICD-10-CM | POA: Diagnosis present

## 2014-10-12 DIAGNOSIS — Y9389 Activity, other specified: Secondary | ICD-10-CM | POA: Diagnosis not present

## 2014-10-12 DIAGNOSIS — S66901A Unspecified injury of unspecified muscle, fascia and tendon at wrist and hand level, right hand, initial encounter: Secondary | ICD-10-CM

## 2014-10-12 DIAGNOSIS — Y9289 Other specified places as the place of occurrence of the external cause: Secondary | ICD-10-CM | POA: Diagnosis not present

## 2014-10-12 DIAGNOSIS — I10 Essential (primary) hypertension: Secondary | ICD-10-CM | POA: Diagnosis not present

## 2014-10-12 DIAGNOSIS — Z87442 Personal history of urinary calculi: Secondary | ICD-10-CM | POA: Insufficient documentation

## 2014-10-12 DIAGNOSIS — Z8639 Personal history of other endocrine, nutritional and metabolic disease: Secondary | ICD-10-CM | POA: Diagnosis not present

## 2014-10-12 MED ORDER — HYDROCODONE-ACETAMINOPHEN 5-325 MG PO TABS: 1.0000 | ORAL_TABLET | ORAL | Status: DC | PRN

## 2014-10-12 NOTE — ED Provider Notes (Signed)
CSN: [id number]     Arrival date & time 10/12/14  1136 History   First MD Initiated Contact with Patient 10/12/14 1220     Chief Complaint  Patient presents with  . Hand Pain     (Consider location/radiation/quality/duration/timing/severity/associated sxs/prior Treatment) HPI Mary Jarvis is a 39 y.o. female who presents to the ED with injury to the right middle finger. She states that last night she was helping her husband work on his mother's roof and she caught her finger and it bent inward and she could not straighten it out. She had a similar problem about a year ago and Dr. Amanda PeaGramig saw her and she wore a splint for several weeks and it got better. The injury has been painful since it happened.   Past Medical History  Diagnosis Date  . Hypertension   . Gastric ulcer   . Hepatitis C   . Hyperlipidemia   . Liver failure   . Tachycardia   . Generalized anxiety disorder   . Peripheral neuropathy   . Kidney stones    Past Surgical History  Procedure Laterality Date  . Rt foot surgery    . Foot surgery    . Tubal ligation     Family History  Problem Relation Age of Onset  . Heart disease Mother   . Diabetes Mother   . Peripheral Artery Disease Father    History  Substance Use Topics  . Smoking status: Former Smoker -- 0.00 packs/day    Types: Cigarettes  . Smokeless tobacco: Not on file  . Alcohol Use: No   OB History    No data available     Review of Systems Negative except as stated in HPI   Allergies  Aspirin; Ibuprofen; Tramadol; and Ancef  Home Medications   Prior to Admission medications   Medication Sig Start Date End Date Taking? Authorizing Provider  HYDROcodone-acetaminophen (NORCO/VICODIN) 5-325 MG per tablet Take 1 tablet by mouth every 4 (four) hours as needed. 10/12/14   Tayden Duran Orlene OchM Savanah Bayles, NP   BP 134/83 mmHg  Pulse 76  Temp(Src) 98.4 F (36.9 C) (Oral)  Resp 19  Ht 5\' 1"  (1.549 m)  Wt 195 lb (88.451 kg)  BMI 36.86 kg/m2  SpO2 100%  LMP  09/13/2014 Physical Exam  Constitutional: She is oriented to person, place, and time. She appears well-developed and well-nourished.  HENT:  Head: Normocephalic.  Eyes: EOM are normal.  Neck: Neck supple.  Cardiovascular: Normal rate.   Pulmonary/Chest: Effort normal.  Musculoskeletal:       Right hand: She exhibits decreased range of motion. She exhibits no laceration. Swelling: mild. Normal sensation noted. Decreased strength noted.       Hands: Patient unable to extend the right middle finger, appears to have extensor tendon injury.  Radial pulse 2+, adequate circulation.   Neurological: She is alert and oriented to person, place, and time. No cranial nerve deficit.  Skin: Skin is warm and dry.  Psychiatric: She has a normal mood and affect. Her behavior is normal.  Nursing note and vitals reviewed.   ED Course  Procedures (including critical care time) Labs Review Labs Reviewed - No data to display  Imaging Review Dg Hand Complete Right  10/12/2014   CLINICAL DATA:  39 year old female with blunt trauma at work, unable to straighten right third finger. Hand pain. Initial encounter.  EXAM: RIGHT HAND - COMPLETE 3+ VIEW  COMPARISON:  04/10/2014.  FINDINGS: The right third finger is flexed at both the  PIP and DIP. No associated fracture or dislocation is identified. The right third MCP appears within normal limits.  Bone mineralization is within normal limits. Distal radius and ulna intact. Carpal bone alignment within normal limits. No acute fracture or dislocation identified.  IMPRESSION: No acute fracture or dislocation identified about the right hand. The right third finger is flexed at both IP joints. Joint spaces and alignment otherwise within normal limits.   Electronically Signed   By: Odessa Fleming M.D.   On: 10/12/2014 13:17    MDM  39 y.o. female with right middle finger injury and inability to extend the finger. Splint applied, ice, rest and follow up with Dr. Amanda Pea who treated  this similar injury one year ago. Stable for discharge without vascular compromise. Discussed with the patient x-ray and clinical findings and all questioned fully answered. She agrees with plan.   Final diagnoses:  Injury of extensor tendon of right hand, initial encounter       [facility], NP 10/13/14 1849  Shon Baton, MD 10/13/14 2039

## 2014-10-12 NOTE — ED Notes (Signed)
R 3rd finger stuck in flexed position since last night after helping single a roof. Pain is 8/10.

## 2015-05-03 ENCOUNTER — Emergency Department (HOSPITAL_COMMUNITY): Payer: Medicaid Other

## 2015-05-03 ENCOUNTER — Encounter (HOSPITAL_COMMUNITY): Payer: Self-pay | Admitting: *Deleted

## 2015-05-03 ENCOUNTER — Emergency Department (HOSPITAL_COMMUNITY)
Admission: EM | Admit: 2015-05-03 | Discharge: 2015-05-03 | Discharge status: Against Medical Advice | Payer: Medicaid Other | Attending: Emergency Medicine | Admitting: Emergency Medicine

## 2015-05-03 DIAGNOSIS — F445 Conversion disorder with seizures or convulsions: Secondary | ICD-10-CM | POA: Insufficient documentation

## 2015-05-03 DIAGNOSIS — Z8619 Personal history of other infectious and parasitic diseases: Secondary | ICD-10-CM | POA: Insufficient documentation

## 2015-05-03 DIAGNOSIS — Z87891 Personal history of nicotine dependence: Secondary | ICD-10-CM | POA: Insufficient documentation

## 2015-05-03 DIAGNOSIS — Z8669 Personal history of other diseases of the nervous system and sense organs: Secondary | ICD-10-CM | POA: Insufficient documentation

## 2015-05-03 DIAGNOSIS — Z8719 Personal history of other diseases of the digestive system: Secondary | ICD-10-CM | POA: Insufficient documentation

## 2015-05-03 DIAGNOSIS — Z87442 Personal history of urinary calculi: Secondary | ICD-10-CM | POA: Insufficient documentation

## 2015-05-03 DIAGNOSIS — I10 Essential (primary) hypertension: Secondary | ICD-10-CM | POA: Insufficient documentation

## 2015-05-03 DIAGNOSIS — Z8639 Personal history of other endocrine, nutritional and metabolic disease: Secondary | ICD-10-CM | POA: Insufficient documentation

## 2015-05-03 LAB — BASIC METABOLIC PANEL
Anion gap: 5 (ref 5–15)
BUN: 10 mg/dL (ref 6–20)
CHLORIDE: 108 mmol/L (ref 101–111)
CO2: 26 mmol/L (ref 22–32)
Calcium: 7.8 mg/dL — ABNORMAL LOW (ref 8.9–10.3)
Creatinine, Ser: 0.68 mg/dL (ref 0.44–1.00)
GFR calc Af Amer: >60 mL/min (ref >60)
GFR calc non Af Amer: >60 mL/min (ref >60)
GLUCOSE: 103 mg/dL — AB (ref 65–99)
Potassium: 3.6 mmol/L (ref 3.5–5.1)
Sodium: 139 mmol/L (ref 135–145)

## 2015-05-03 LAB — CBC WITH DIFFERENTIAL/PLATELET
Basophils Absolute: 0 10*3/uL (ref 0.0–0.1)
Basophils Relative: 1 %
Eosinophils Absolute: 0.3 10*3/uL (ref 0.0–0.7)
Eosinophils Relative: 4 %
HCT: 36.3 % (ref 36.0–46.0)
HEMOGLOBIN: 11.8 g/dL — AB (ref 12.0–15.0)
LYMPHS ABS: 2.9 10*3/uL (ref 0.7–4.0)
Lymphocytes Relative: 39 %
MCH: 29.1 pg (ref 26.0–34.0)
MCHC: 32.5 g/dL (ref 30.0–36.0)
MCV: 89.6 fL (ref 78.0–100.0)
MONO ABS: 0.5 10*3/uL (ref 0.1–1.0)
Monocytes Relative: 7 %
NEUTROS ABS: 3.8 10*3/uL (ref 1.7–7.7)
NEUTROS PCT: 49 %
Platelets: 193 10*3/uL (ref 150–400)
RBC: 4.05 MIL/uL (ref 3.87–5.11)
RDW: 14.4 % (ref 11.5–15.5)
WBC: 7.5 10*3/uL (ref 4.0–10.5)

## 2015-05-03 LAB — HEPATIC FUNCTION PANEL
ALK PHOS: 41 U/L (ref 38–126)
ALT: 15 U/L (ref 14–54)
AST: 17 U/L (ref 15–41)
Albumin: 3.5 g/dL (ref 3.5–5.0)
BILIRUBIN DIRECT: 0.1 mg/dL (ref 0.1–0.5)
Indirect Bilirubin: 0.5 mg/dL (ref 0.3–0.9)
Total Bilirubin: 0.6 mg/dL (ref 0.3–1.2)
Total Protein: 6.3 g/dL — ABNORMAL LOW (ref 6.5–8.1)

## 2015-05-03 LAB — MAGNESIUM: Magnesium: 1.8 mg/dL (ref 1.7–2.4)

## 2015-05-03 LAB — ETHANOL

## 2015-05-03 MED ORDER — LORAZEPAM 2 MG/ML IJ SOLN
1.0000 mg | Freq: Once | INTRAMUSCULAR | Status: AC
  Administered 2015-05-03: 1 mg via INTRAVENOUS

## 2015-05-03 MED ORDER — LORAZEPAM 2 MG/ML IJ SOLN
INTRAMUSCULAR | Status: AC
  Administered 2015-05-03: 1 mg via INTRAVENOUS
  Filled 2015-05-03: qty 1

## 2015-05-03 MED ORDER — LORAZEPAM 2 MG/ML IJ SOLN
2.0000 mg | Freq: Once | INTRAMUSCULAR | Status: AC
  Administered 2015-05-03: 2 mg via INTRAVENOUS

## 2015-05-03 MED ORDER — SODIUM CHLORIDE 0.9 % IV SOLN
INTRAVENOUS | Status: DC
  Administered 2015-05-03: 16:00:00 via INTRAVENOUS

## 2015-05-03 MED ORDER — SODIUM CHLORIDE 0.9 % IV BOLUS (SEPSIS)
1000.0000 mL | Freq: Once | INTRAVENOUS | Status: AC
  Administered 2015-05-03: 1000 mL via INTRAVENOUS

## 2015-05-03 MED ORDER — LORAZEPAM 2 MG/ML IJ SOLN
INTRAMUSCULAR | Status: AC
  Filled 2015-05-03: qty 1

## 2015-05-03 NOTE — ED Notes (Signed)
Pt refusing to stay. Pt wanting to leave AMA. Pt signed AMA paperwork and IV was removed. Dr. Deretha Emory notified.

## 2015-05-03 NOTE — ED Notes (Signed)
Pt had another seizure lasting approximately 4 minutes, RN at bedside. HR remained in the 90's, BP 106/57. Dr. Deretha Emory notified and gave order to give Ativan  IV. Medication given. Pt continues to be monitored.

## 2015-05-03 NOTE — ED Notes (Signed)
Pt started having another seizure around 1445. Dr. Deretha Emory at bedside. Ativan IV  given.

## 2015-05-03 NOTE — ED Notes (Signed)
Pt was in the back of a police car and pt started having a seizure for approximately 1 minute per police officer. Police said they saw pt leaning her head backwards and chest pressed forward. Pt was removed from the car by the police officer and laid flat on the ground. Pt started having slurred, delayed speech which was new for pt. Police report pt was talking normally before seizure occurred. [facility] was called and brought pt to ED. EMS reports pt has hx of seizures and takes Keppra at home. EMS reports pt ws ST on monitor, BP 120/71, O2 sat 98% on RA and pt was slightly postictal.

## 2015-05-03 NOTE — ED Provider Notes (Signed)
CSN: [id number]     Arrival date & time 05/03/15  1434 History   First MD Initiated Contact with Patient 05/03/15 1452     Chief Complaint  Patient presents with  . Seizures     (Consider location/radiation/quality/duration/timing/severity/associated sxs/prior Treatment) Patient is a 39 y.o. female presenting with seizures. The history is provided by the patient and the police. The history is limited by the condition of the patient.  Seizures  level V caveat applies to the history. Patient actively seizing. Patient brought in by police officer. Patient was being escorted in the police vehicle to the police station for a rest. Patient started to have seizure activity in the back of the vehicle was brought here. Police officer stated seizure activity lasted 1 minute. Patient arrived screaming out alert and shortly after being put back into the ED room started to have seizure activity. Patient's past medical history does not list any history of seizure activity that indications do not seem to include any antiseizure meds. Patient does have a history of generalized anxiety disorder.  Past Medical History  Diagnosis Date  . Hypertension   . Gastric ulcer   . Hepatitis C   . Hyperlipidemia   . Liver failure (HCC)   . Tachycardia   . Generalized anxiety disorder   . Peripheral neuropathy (HCC)   . Kidney stones    Past Surgical History  Procedure Laterality Date  . Rt foot surgery    . Foot surgery    . Tubal ligation     Family History  Problem Relation Age of Onset  . Heart disease Mother   . Diabetes Mother   . Peripheral Artery Disease Father    Social History  Substance Use Topics  . Smoking status: Former Smoker -- 0.00 packs/day    Types: Cigarettes  . Smokeless tobacco: None  . Alcohol Use: No   OB History    No data available     Review of Systems  Unable to perform ROS Neurological: Positive for seizures.   level V caveat applies to the review of systems  patient having seizure activity.    Allergies  Aspirin; Ibuprofen; Tramadol; and Ancef  Home Medications   Prior to Admission medications   Medication Sig Start Date End Date Taking? Authorizing Provider  HYDROcodone-acetaminophen (NORCO/VICODIN) 5-325 MG per tablet Take 1 tablet by mouth every 4 (four) hours as needed. 10/12/14   Hope Orlene Och, NP   BP 106/57 mmHg  Pulse 93  Temp(Src) 97.9 F (36.6 C) (Oral)  Resp 12  Ht 5' (1.524 m)  Wt 195 lb (88.451 kg)  BMI 38.08 kg/m2  SpO2 97%  LMP 04/28/2015 Physical Exam  Constitutional: She appears well-developed and well-nourished. No distress.  HENT:  Head: Normocephalic and atraumatic.  Mouth/Throat: Oropharynx is clear and moist.  Eyes: Conjunctivae and EOM are normal. Pupils are equal, round, and reactive to light.  Neck: Normal range of motion. Neck supple.  Cardiovascular: Normal rate, regular rhythm and normal heart sounds.   No murmur heard. Pulmonary/Chest: Effort normal and breath sounds normal. No respiratory distress.  Abdominal: Soft. Bowel sounds are normal. There is no tenderness.  Musculoskeletal: Normal range of motion.  Neurological:  Patient actively seizing with eyes open hands and tight fist. Feet moving up and down movement. But very slow.  Skin: No erythema.  Nursing note and vitals reviewed.   ED Course  Procedures (including critical care time) Labs Review Labs Reviewed  CBC WITH DIFFERENTIAL/PLATELET - Abnormal;  Notable for the following:    Hemoglobin 11.8 (*)    All other components within normal limits  BASIC METABOLIC PANEL - Abnormal; Notable for the following:    Glucose, Bld 103 (*)    Calcium 7.8 (*)    All other components within normal limits  HEPATIC FUNCTION PANEL - Abnormal; Notable for the following:    Total Protein 6.3 (*)    All other components within normal limits  MAGNESIUM  ETHANOL  URINE RAPID DRUG SCREEN, HOSP PERFORMED   Results for orders placed or performed during  the hospital encounter of 05/03/15  CBC with Differential/Platelet  Result Value Ref Range   WBC 7.5 4.0 - 10.5 K/uL   RBC 4.05 3.87 - 5.11 MIL/uL   Hemoglobin 11.8 (L) 12.0 - 15.0 g/dL   HCT 19.1 47.8 - 29.5 %   MCV 89.6 78.0 - 100.0 fL   MCH 29.1 26.0 - 34.0 pg   MCHC 32.5 30.0 - 36.0 g/dL   RDW 62.1 30.8 - 65.7 %   Platelets 193 150 - 400 K/uL   Neutrophils Relative % 49 %   Neutro Abs 3.8 1.7 - 7.7 K/uL   Lymphocytes Relative 39 %   Lymphs Abs 2.9 0.7 - 4.0 K/uL   Monocytes Relative 7 %   Monocytes Absolute 0.5 0.1 - 1.0 K/uL   Eosinophils Relative 4 %   Eosinophils Absolute 0.3 0.0 - 0.7 K/uL   Basophils Relative 1 %   Basophils Absolute 0.0 0.0 - 0.1 K/uL  Basic metabolic panel  Result Value Ref Range   Sodium 139 135 - 145 mmol/L   Potassium 3.6 3.5 - 5.1 mmol/L   Chloride 108 101 - 111 mmol/L   CO2 26 22 - 32 mmol/L   Glucose, Bld 103 (H) 65 - 99 mg/dL   BUN 10 6 - 20 mg/dL   Creatinine, Ser 8.46 0.44 - 1.00 mg/dL   Calcium 7.8 (L) 8.9 - 10.3 mg/dL   GFR calc non Af Amer >60 >60 mL/min   GFR calc Af Amer >60 >60 mL/min   Anion gap 5 5 - 15  Magnesium  Result Value Ref Range   Magnesium 1.8 1.7 - 2.4 mg/dL  Ethanol  Result Value Ref Range   Alcohol, Ethyl (B) <5 <5 mg/dL  Hepatic function panel  Result Value Ref Range   Total Protein 6.3 (L) 6.5 - 8.1 g/dL   Albumin 3.5 3.5 - 5.0 g/dL   AST 17 15 - 41 U/L   ALT 15 14 - 54 U/L   Alkaline Phosphatase 41 38 - 126 U/L   Total Bilirubin 0.6 0.3 - 1.2 mg/dL   Bilirubin, Direct 0.1 0.1 - 0.5 mg/dL   Indirect Bilirubin 0.5 0.3 - 0.9 mg/dL     Imaging Review Ct Head Wo Contrast  05/03/2015   CLINICAL DATA:  Seizures  EXAM: CT HEAD WITHOUT CONTRAST  TECHNIQUE: Contiguous axial images were obtained from the base of the skull through the vertex without intravenous contrast.  COMPARISON:  03/23/2013  FINDINGS: There is no evidence of mass effect, midline shift, or extra-axial fluid collections. There is no evidence  of a space-occupying lesion or intracranial hemorrhage. There is no evidence of a cortical-based area of acute infarction.  The ventricles and sulci are appropriate for the patient's age. The basal cisterns are patent.  Visualized portions of the orbits are unremarkable. The visualized portions of the paranasal sinuses and mastoid air cells are unremarkable.  There is no  acute osseous abnormality. There is incomplete fusion of the posterior arch of C1.  IMPRESSION: No acute intracranial pathology.   Electronically Signed   By: Elige Ko   On: 05/03/2015 16:19   I have personally reviewed and evaluated these images and lab results as part of my medical decision-making.   EKG Interpretation None     CRITICAL CARE Performed by: Vanetta Mulders Total critical care time: 30 Critical care time was exclusive of separately billable procedures and treating other patients. Critical care was necessary to treat or prevent imminent or life-threatening deterioration. Critical care was time spent personally by me on the following activities: development of treatment plan with patient and/or surrogate as well as nursing, discussions with consultants, evaluation of patient's response to treatment, examination of patient, obtaining history from patient or surrogate, ordering and performing treatments and interventions, ordering and review of laboratory studies, ordering and review of radiographic studies, pulse oximetry and re-evaluation of patient's condition.  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 They brought her here. In the police vehicle seizure lasted approximately 1 minute according to the police officer. Upon arrival here patient the was verbal and yelling out and then went into a tonic type seizure without any clonic activity that lasted  approximately 5 minutes. Patient received 2 mg of Ativan patient instantaneously started talking normal very brief postictal period probably lasting 2 seconds. Patient went on to have a third seizure witnessed by [place] same type of tonic activity without any clonic activity. Eyes open throughout the 2 seizures witnessed here. Patient received 1 more milligram of Ativan. Clinically there was no incontinence the seizures appeared to be pseudoseizure type activity.   When the police officer determined that they were not going to arrest her and left patient instantaneously got better and wanted to leave AMA. Patient's head CT without any acute findings. Patient's labs without any acute abnormalities blood sugar 103 CO2 normal highly unlikely that these were true seizures. However patient's blood work was not completely back when she left AMA. The police had determined that they did not need to have her in their custody.   Patient claimed that she was on Keppra medication for seizures past medical history shows that she has a history of generalized anxiety we have no listing of her being on antiseizure meds.   Vanetta Mulders, MD 05/03/15 1756

## 2015-08-28 ENCOUNTER — Encounter (HOSPITAL_COMMUNITY): Payer: Self-pay | Admitting: Emergency Medicine

## 2015-08-28 ENCOUNTER — Emergency Department (HOSPITAL_COMMUNITY)
Admission: EM | Admit: 2015-08-28 | Discharge: 2015-08-28 | Disposition: A | Discharge status: Home / Self Care | Payer: Self-pay | Attending: Emergency Medicine | Admitting: Emergency Medicine

## 2015-08-28 ENCOUNTER — Emergency Department (HOSPITAL_COMMUNITY): Payer: Medicaid Other

## 2015-08-28 DIAGNOSIS — Z8639 Personal history of other endocrine, nutritional and metabolic disease: Secondary | ICD-10-CM | POA: Insufficient documentation

## 2015-08-28 DIAGNOSIS — Z8719 Personal history of other diseases of the digestive system: Secondary | ICD-10-CM | POA: Insufficient documentation

## 2015-08-28 DIAGNOSIS — Z87891 Personal history of nicotine dependence: Secondary | ICD-10-CM | POA: Insufficient documentation

## 2015-08-28 DIAGNOSIS — Z8659 Personal history of other mental and behavioral disorders: Secondary | ICD-10-CM | POA: Insufficient documentation

## 2015-08-28 DIAGNOSIS — Z9851 Tubal ligation status: Secondary | ICD-10-CM | POA: Insufficient documentation

## 2015-08-28 DIAGNOSIS — I10 Essential (primary) hypertension: Secondary | ICD-10-CM | POA: Insufficient documentation

## 2015-08-28 DIAGNOSIS — R109 Unspecified abdominal pain: Secondary | ICD-10-CM | POA: Insufficient documentation

## 2015-08-28 DIAGNOSIS — Z87442 Personal history of urinary calculi: Secondary | ICD-10-CM | POA: Insufficient documentation

## 2015-08-28 DIAGNOSIS — Z8619 Personal history of other infectious and parasitic diseases: Secondary | ICD-10-CM | POA: Insufficient documentation

## 2015-08-28 DIAGNOSIS — M545 Low back pain: Secondary | ICD-10-CM | POA: Insufficient documentation

## 2015-08-28 DIAGNOSIS — Z3202 Encounter for pregnancy test, result negative: Secondary | ICD-10-CM | POA: Insufficient documentation

## 2015-08-28 DIAGNOSIS — R300 Dysuria: Secondary | ICD-10-CM | POA: Insufficient documentation

## 2015-08-28 DIAGNOSIS — G894 Chronic pain syndrome: Secondary | ICD-10-CM | POA: Insufficient documentation

## 2015-08-28 HISTORY — DX: Chronic pain syndrome: G89.4

## 2015-08-28 LAB — URINALYSIS, ROUTINE W REFLEX MICROSCOPIC
BILIRUBIN URINE: NEGATIVE
Glucose, UA: NEGATIVE mg/dL
Hgb urine dipstick: NEGATIVE
KETONES UR: NEGATIVE mg/dL
Leukocytes, UA: NEGATIVE
Nitrite: NEGATIVE
PH: 5 (ref 5.0–8.0)
Protein, ur: NEGATIVE mg/dL
Specific Gravity, Urine: >1.03 — ABNORMAL HIGH (ref 1.005–1.030)

## 2015-08-28 LAB — PREGNANCY, URINE: Preg Test, Ur: NEGATIVE

## 2015-08-28 MED ORDER — HYDROCODONE-ACETAMINOPHEN 5-325 MG PO TABS
2.0000 | ORAL_TABLET | Freq: Once | ORAL | Status: AC
  Administered 2015-08-28: 2 via ORAL
  Filled 2015-08-28: qty 2

## 2015-08-28 MED ORDER — KETOROLAC TROMETHAMINE 60 MG/2ML IM SOLN
60.0000 mg | Freq: Once | INTRAMUSCULAR | Status: AC
  Administered 2015-08-28: 60 mg via INTRAMUSCULAR
  Filled 2015-08-28: qty 2

## 2015-08-28 MED ORDER — METHOCARBAMOL 500 MG PO TABS: 500.0000 mg | ORAL_TABLET | Freq: Two times a day (BID) | ORAL | Status: DC | PRN

## 2015-08-28 MED ORDER — METHOCARBAMOL 500 MG PO TABS
500.0000 mg | ORAL_TABLET | Freq: Once | ORAL | Status: AC
  Administered 2015-08-28: 500 mg via ORAL
  Filled 2015-08-28: qty 1

## 2015-08-28 NOTE — ED Provider Notes (Signed)
CSN: [id number]     Arrival date & time 08/28/15  1443 History   First MD Initiated Contact with Patient 08/28/15 1501     Chief Complaint  Patient presents with  . Flank Pain     (Consider location/radiation/quality/duration/timing/severity/associated sxs/prior Treatment) HPI Comments: Flank pain onset 7 days ago on the L - initially intermittent but since has become constant over 3 days - made worse with movement - such as bending over and palpation - she has to sleep with a couple of pillows under her back, and denies any numbness / weakness of the legs - no f/c and no vomiting - had bacon cheeseburger and potato chips prior to arrival without difficulty.  Has some dysuriea but no hematuria - ambulating without difficulty - no retention or incontinence, no hx of IVDU or CA.  Patient is a 40 y.o. female presenting with flank pain. The history is provided by the patient and the spouse.  Flank Pain    Past Medical History  Diagnosis Date  . Hypertension   . Gastric ulcer   . Hepatitis C   . Hyperlipidemia   . Liver failure (HCC)   . Tachycardia   . Generalized anxiety disorder   . Peripheral neuropathy (HCC)   . Kidney stones   . Chronic pain syndrome    Past Surgical History  Procedure Laterality Date  . Rt foot surgery    . Foot surgery    . Tubal ligation     Family History  Problem Relation Age of Onset  . Heart disease Mother   . Diabetes Mother   . Peripheral Artery Disease Father    Social History  Substance Use Topics  . Smoking status: Former Smoker -- 0.00 packs/day    Types: Cigarettes  . Smokeless tobacco: None  . Alcohol Use: No   OB History    No data available     Review of Systems  Genitourinary: Positive for flank pain.  All other systems reviewed and are negative.     Allergies  Aspirin; Ibuprofen; Tramadol; and Ancef  Home Medications   Prior to Admission medications   Medication Sig Start Date End Date Taking? Authorizing Provider   acetaminophen (TYLENOL) 500 MG tablet Take 500 mg by mouth every 6 (six) hours as needed for mild pain or moderate pain.   Yes Historical Provider, MD   BP 114/73 mmHg  Pulse 93  Temp(Src) 97.8 F (36.6 C) (Oral)  Resp 16  Ht 5\' 1"  (1.549 m)  Wt 172 lb (78.019 kg)  BMI 32.52 kg/m2  SpO2 91%  LMP 08/11/2015 Physical Exam  Constitutional: She appears well-developed and well-nourished. No distress.  HENT:  Head: Normocephalic and atraumatic.  Mouth/Throat: Oropharynx is clear and moist. No oropharyngeal exudate.  Eyes: Conjunctivae and EOM are normal. Pupils are equal, round, and reactive to light. Right eye exhibits no discharge. Left eye exhibits no discharge. No scleral icterus.  Neck: Normal range of motion. Neck supple. No JVD present. No thyromegaly present.  Cardiovascular: Normal rate, regular rhythm, normal heart sounds and intact distal pulses.  Exam reveals no gallop and no friction rub.   No murmur heard. Pulmonary/Chest: Effort normal and breath sounds normal. No respiratory distress. She has no wheezes. She has no rales.  Abdominal: Soft. Bowel sounds are normal. She exhibits no distension and no mass. There is no tenderness.  No abdominal tenderness  Musculoskeletal: Normal range of motion. She exhibits tenderness ( Focal tenderness to palpation in the left  lower back and the left buttock, worse with straight leg raise). She exhibits no edema.  No spinal tenderness, normal strength of the bilateral lower extremities, able to straight leg raise bilaterally, normal sensation of the bilateral lower extremities  Lymphadenopathy:    She has no cervical adenopathy.  Neurological: She is alert. Coordination normal.  Skin: Skin is warm and dry. No rash noted. No erythema.  Psychiatric: She has a normal mood and affect. Her behavior is normal.  Nursing note and vitals reviewed.   ED Course  Procedures (including critical care time) Labs Review Labs Reviewed  URINALYSIS,  ROUTINE W REFLEX MICROSCOPIC (NOT AT [facility]) - Abnormal; Notable for the following:    Specific Gravity, Urine >1.030 (*)    All other components within normal limits  PREGNANCY, URINE    Imaging Review Ct Renal Stone Study  08/28/2015  CLINICAL DATA:  Acute left flank pain radiating to the low back for 7 days. Hematuria. History of nephrolithiasis. EXAM: CT ABDOMEN AND PELVIS WITHOUT CONTRAST TECHNIQUE: Multidetector CT imaging of the abdomen and pelvis was performed following the standard protocol without IV contrast. COMPARISON:  09/19/2012 FINDINGS: Lower chest:  No acute findings. Hepatobiliary: 15 mm hypodense indeterminate hepatic lesion in the left hepatic lobe lateral segment, image 13. No other definite hepatic abnormality by noncontrast imaging. No biliary dilatation. Gallbladder is collapsed. Biliary system unremarkable. Pancreas: No mass or inflammatory process identified on this un-enhanced exam. Spleen: Within normal limits in size. Adrenals/Urinary Tract: No evidence of urolithiasis or hydronephrosis. No definite mass visualized on this un-enhanced exam. Stomach/Bowel: No evidence of obstruction, inflammatory process, or abnormal fluid collections. Normal appendix demonstrated. Vascular/Lymphatic: No pathologically enlarged lymph nodes. No evidence of abdominal aortic aneurysm. Reproductive: No mass or other significant abnormality. Other: No inguinal abnormality or hernia.  Intact abdominal wall. Musculoskeletal:  No acute or abnormal osseous finding. IMPRESSION: No acute obstructing urinary tract or ureteral calculus. No acute obstructive uropathy or hydronephrosis. No other acute intra-abdominal or pelvic process by noncontrast CT. 15 mm left hepatic lobe indeterminate hypodense lesion by noncontrast imaging. Recommend follow-up nonemergent abdominal MRI without and with contrast. Electronically Signed   By: Judie Petit.  Shick M.D.   On: 08/28/2015 17:00   I have personally reviewed and evaluated  these images and lab results as part of my medical decision-making.    MDM   Final diagnoses:  Flank pain    The patient's symptoms do seem to get worse with movement and palpation suggesting a musculoskeletal cause of her symptoms. We'll check urinalysis to rule out hematuria or pyelonephritis, pain medications have been ordered, the patient does not have active stomach ulcers, a single dose of anti-inflammatories should not be contraindicated in this patient.  The patient has a normal CT scan other than a hepatic lobe abnormality for which she will need to follow-up with her doctor, she will be given a follow-up list for family doctors if she does not have one, she has been informed of all of her results verbally as well as the indications for follow-up and return and she expresses her understanding. Urinalysis without findings of infection or hematuria, CT scan without findings of kidney stone abscess or other abnormalities that would cause acute pain  Meds given in ED:  Medications  HYDROcodone-acetaminophen (NORCO/VICODIN) 5-325 MG per tablet 2 tablet (not administered)  ketorolac (TORADOL) injection 60 mg (60 mg Intramuscular Given 08/28/15 1514)  methocarbamol (ROBAXIN) tablet 500 mg (500 mg Oral Given 08/28/15 1514)    New Prescriptions  No medications on file      Eber Hong, MD 08/28/15 [phone]

## 2015-08-28 NOTE — ED Notes (Signed)
Pt reports LT sided flank pain that radiates to lower back x 7 days. Pt also reports blood in urine. Pt hx of kidney stones.

## 2015-08-28 NOTE — Discharge Instructions (Signed)
Flank Pain Flank pain is pain in your side. The flank is the area of your side between your upper belly (abdomen) and your back. Pain in this area can be caused by many different things. HOME CARE Home care and treatment will depend on the cause of your pain.  Rest as told by your doctor.  Drink enough fluids to keep your pee (urine) clear or pale yellow.  Only take medicine as told by your doctor.  Tell your doctor about any changes in your pain.  Follow up with your doctor. GET HELP RIGHT AWAY IF:   Your pain does not get better with medicine.   You have new symptoms or your symptoms get worse.  Your pain gets worse.   You have belly (abdominal) pain.   You are short of breath.   You always feel sick to your stomach (nauseous).   You keep throwing up (vomiting).   You have puffiness (swelling) in your belly.   You feel light-headed or you pass out (faint).   You have blood in your pee.  You have a fever or lasting symptoms for more than 2-3 days.  You have a fever and your symptoms suddenly get worse. MAKE SURE YOU:   Understand these instructions.  Will watch your condition.  Will get help right away if you are not doing well or get worse.   This information is not intended to replace advice given to you by your health care provider. Make sure you discuss any questions you have with your health care provider.   Document Released: 04/21/2008 Document Revised: 08/03/2014 Document Reviewed: 02/25/2012 Elsevier Interactive Patient Education 2016 [employer] Inc[employer]   [facility] Doctor List    Kari Baars MD. Specialty: Pulmonary Disease Contact information: [address]  [address]  [place] [address]  [phone]   Syliva Overman, MD. Specialty: [facility] Medicine Contact information: [address]  [place] [address]  [phone]   Lilyan Punt, MD. Specialty: [facility] Medicine Contact information: [address]  [place] [address]  [phone]   Avon Gully, MD Specialty: Internal Medicine Contact information: [address] [place] [address]  [phone]   Catalina Pizza, MD. Specialty: Internal Medicine Contact information: [address]  [place] [address]  [phone]   Butch Penny, MD. Specialty: Family Medicine Contact information: [address]  [place] [address]  [phone]   John Giovanni, MD. Specialty: [facility] Medicine Contact information: [address]  [address]  [place] [address]  [phone]   Carylon Perches, MD. Specialty: Internal Medicine Contact information: [address]  [address]  [place] [address]  [phone]

## 2015-12-25 ENCOUNTER — Emergency Department (HOSPITAL_COMMUNITY)
Admission: EM | Admit: 2015-12-25 | Discharge: 2015-12-25 | Disposition: A | Discharge status: Home / Self Care | Payer: No Typology Code available for payment source | Attending: Emergency Medicine | Admitting: Emergency Medicine

## 2015-12-25 ENCOUNTER — Encounter (HOSPITAL_COMMUNITY): Payer: Self-pay

## 2015-12-25 DIAGNOSIS — Z87891 Personal history of nicotine dependence: Secondary | ICD-10-CM | POA: Insufficient documentation

## 2015-12-25 DIAGNOSIS — E785 Hyperlipidemia, unspecified: Secondary | ICD-10-CM | POA: Insufficient documentation

## 2015-12-25 DIAGNOSIS — G5603 Carpal tunnel syndrome, bilateral upper limbs: Secondary | ICD-10-CM | POA: Insufficient documentation

## 2015-12-25 DIAGNOSIS — I10 Essential (primary) hypertension: Secondary | ICD-10-CM | POA: Insufficient documentation

## 2015-12-25 NOTE — ED Provider Notes (Signed)
CSN: [id number]     Arrival date & time 12/25/15  0654 History   First MD Initiated Contact with Patient 12/25/15 [phone]     Chief Complaint  Patient presents with  . Hand Pain     (Consider location/radiation/quality/duration/timing/severity/associated sxs/prior Treatment) HPI  Patient is a 40 year old female that presents the [facility] emergency department for evaluation of bilateral hand pain. She describes the pain as pins and needles and burning. She reports that this started years ago but got purposely worsen last 4 days. She reports that this is associated with an increase in work hours from the same time period. 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 She reports that her pain is worse when she gets home from work and in the morning and that symptoms improve after she started work for about an hour. She has associated grip strength weakness. She's been taking Goody's powder, which does help with her pain, and using a hot rag which does not help much. She does not have a history of back injury. Past Medical History  Diagnosis Date  . Hypertension   . Gastric ulcer   . Hepatitis C   . Hyperlipidemia   . Liver failure (HCC)   . Tachycardia   . Generalized anxiety disorder   . Peripheral neuropathy (HCC)   . Kidney stones   . Chronic pain syndrome    Past Surgical History  Procedure Laterality Date  . Rt foot surgery    . Foot surgery    . Tubal ligation     Family History  Problem Relation Age of Onset  . Heart disease Mother   . Diabetes Mother   . Peripheral Artery Disease Father    Social History  Substance Use Topics  . Smoking status: Former Smoker -- 0.00 packs/day    Types: Cigarettes  . Smokeless tobacco: None  . Alcohol Use: No   OB History    No data available     Review of Systems  Constitutional: Negative for fever.  Musculoskeletal: Negative for back pain, joint  swelling, arthralgias, gait problem, neck pain and neck stiffness.  Neurological: Positive for numbness.  All other systems reviewed and are negative.     Allergies  Aspirin; Ibuprofen; Tramadol; and Ancef  Home Medications   Prior to Admission medications   Medication Sig Start Date End Date Taking? Authorizing Provider  acetaminophen (TYLENOL) 500 MG tablet Take 500 mg by mouth every 6 (six) hours as needed for mild pain or moderate pain.    Historical Provider, MD  methocarbamol (ROBAXIN) 500 MG tablet Take 1 tablet (500 mg total) by mouth 2 (two) times daily as needed for muscle spasms. 08/28/15   Eber Hong, MD   BP 99/68 mmHg  Pulse 78  Temp(Src) 98.3 F (36.8 C) (Oral)  Resp 16  Ht  (1.549 m)  Wt 84.823 kg  BMI 35.35 kg/m2  SpO2 96%  LMP 12/17/2015 Physical Exam  Constitutional: She is oriented to person, place, and time. She appears well-developed and well-nourished. No distress.  HENT:  Mouth/Throat: Oropharynx is clear and moist.  Eyes: Conjunctivae and EOM are normal. Pupils are equal, round, and reactive to light.  Neck: Normal range of motion.  Cardiovascular: Normal rate, regular rhythm and normal heart sounds.   No murmur heard. Pulmonary/Chest: Effort normal and breath sounds normal. No respiratory distress. She has no wheezes. She has no  rales.  Abdominal: Soft. Bowel sounds are normal. She exhibits no distension. There is no tenderness. There is no rebound and no guarding.  Musculoskeletal: Normal range of motion. She exhibits no edema or tenderness.       Right hand: She exhibits normal range of motion and no tenderness. Normal sensation noted. Decreased strength (grip strength 4/5) noted.       Left hand: She exhibits normal range of motion and no tenderness. Decreased strength (grip strength 4/5) noted.  Lymphadenopathy:    She has no cervical adenopathy.  Neurological: She is alert and oriented to person, place, and time.  Phalen and Tinnel  positive bilaterally  Skin: Skin is warm and dry. She is not diaphoretic.    ED Course  Procedures (including critical care time) Labs Review Labs Reviewed - No data to display  Imaging Review No results found. I have personally reviewed and evaluated these images and lab results as part of my medical decision-making.   EKG Interpretation None      MDM   Final diagnoses:  Bilateral carpal tunnel syndrome   Patient symptoms consistent with bilateral carpal tunnel syndrome. Advised patient to pick up a pair of cockup splints. Also advise that she has options including local injection versus surgery. Provided referral for orthopedic surgery so she can discuss her options. Patient agrees with plan and will pick up cockup splints to wear at night. She does not wish to decrease work hours at this time. Patient stable for discharge home.    Narda Bondsalph A Nettey, MD 12/25/15 1517  Blane OharaJoshua Zavitz, MD 12/25/15 [phone]

## 2015-12-25 NOTE — ED Notes (Signed)
Pt made aware to return if symptoms worsen or if any life threatening symptoms occur.   

## 2015-12-25 NOTE — Discharge Instructions (Signed)
You likely have carpal tunnel syndrome. I recommend using a cock-up splint to use at night. You can pick this up over the counter at a pharmacy. I will provide you with information to follow-up with orthopedic surgery to discuss further options, including injections and/or surgery.

## 2015-12-25 NOTE — ED Notes (Signed)
Pt reports pain in both hands for the past 3 days.  Denies injury.  Pt says she has noticed some swelling to her fingers.  Pt says she left work early last night and said her supervisor told her to be evaluated before she comes back to work.

## 2016-01-11 ENCOUNTER — Other Ambulatory Visit: Payer: Self-pay

## 2016-01-11 ENCOUNTER — Encounter (HOSPITAL_COMMUNITY): Payer: Self-pay | Admitting: Emergency Medicine

## 2016-01-11 ENCOUNTER — Emergency Department (HOSPITAL_COMMUNITY)
Admission: EM | Admit: 2016-01-11 | Discharge: 2016-01-11 | Disposition: A | Discharge status: Home / Self Care | Payer: Self-pay | Attending: Emergency Medicine | Admitting: Emergency Medicine

## 2016-01-11 ENCOUNTER — Emergency Department (HOSPITAL_COMMUNITY): Payer: Self-pay

## 2016-01-11 DIAGNOSIS — Z791 Long term (current) use of non-steroidal anti-inflammatories (NSAID): Secondary | ICD-10-CM | POA: Insufficient documentation

## 2016-01-11 DIAGNOSIS — I10 Essential (primary) hypertension: Secondary | ICD-10-CM | POA: Insufficient documentation

## 2016-01-11 DIAGNOSIS — R0602 Shortness of breath: Secondary | ICD-10-CM | POA: Insufficient documentation

## 2016-01-11 DIAGNOSIS — E785 Hyperlipidemia, unspecified: Secondary | ICD-10-CM | POA: Insufficient documentation

## 2016-01-11 DIAGNOSIS — R358 Other polyuria: Secondary | ICD-10-CM | POA: Insufficient documentation

## 2016-01-11 DIAGNOSIS — F17210 Nicotine dependence, cigarettes, uncomplicated: Secondary | ICD-10-CM | POA: Insufficient documentation

## 2016-01-11 DIAGNOSIS — R609 Edema, unspecified: Secondary | ICD-10-CM

## 2016-01-11 DIAGNOSIS — Z79899 Other long term (current) drug therapy: Secondary | ICD-10-CM | POA: Insufficient documentation

## 2016-01-11 DIAGNOSIS — R600 Localized edema: Secondary | ICD-10-CM | POA: Insufficient documentation

## 2016-01-11 LAB — CBC WITH DIFFERENTIAL/PLATELET
BASOS ABS: 0 10*3/uL (ref 0.0–0.1)
Basophils Relative: 0 %
EOS PCT: 2 %
Eosinophils Absolute: 0.2 10*3/uL (ref 0.0–0.7)
HEMATOCRIT: 35.3 % — AB (ref 36.0–46.0)
Hemoglobin: 11.4 g/dL — ABNORMAL LOW (ref 12.0–15.0)
LYMPHS ABS: 2.2 10*3/uL (ref 0.7–4.0)
LYMPHS PCT: 31 %
MCH: 28.8 pg (ref 26.0–34.0)
MCHC: 32.3 g/dL (ref 30.0–36.0)
MCV: 89.1 fL (ref 78.0–100.0)
Monocytes Absolute: 0.4 10*3/uL (ref 0.1–1.0)
Monocytes Relative: 6 %
NEUTROS ABS: 4.3 10*3/uL (ref 1.7–7.7)
Neutrophils Relative %: 61 %
PLATELETS: 166 10*3/uL (ref 150–400)
RBC: 3.96 MIL/uL (ref 3.87–5.11)
RDW: 14.8 % (ref 11.5–15.5)
WBC: 7.2 10*3/uL (ref 4.0–10.5)

## 2016-01-11 LAB — URINE MICROSCOPIC-ADD ON
Bacteria, UA: NONE SEEN
WBC UA: NONE SEEN WBC/hpf (ref 0–5)

## 2016-01-11 LAB — COMPREHENSIVE METABOLIC PANEL
ALBUMIN: 3.7 g/dL (ref 3.5–5.0)
ALT: 21 U/L (ref 14–54)
ANION GAP: 6 (ref 5–15)
AST: 30 U/L (ref 15–41)
Alkaline Phosphatase: 52 U/L (ref 38–126)
BILIRUBIN TOTAL: 0.3 mg/dL (ref 0.3–1.2)
BUN: 7 mg/dL (ref 6–20)
CHLORIDE: 102 mmol/L (ref 101–111)
CO2: 29 mmol/L (ref 22–32)
Calcium: 8.7 mg/dL — ABNORMAL LOW (ref 8.9–10.3)
Creatinine, Ser: 0.83 mg/dL (ref 0.44–1.00)
GFR calc Af Amer: >60 mL/min (ref >60)
GFR calc non Af Amer: >60 mL/min (ref >60)
Glucose, Bld: 109 mg/dL — ABNORMAL HIGH (ref 65–99)
POTASSIUM: 3.4 mmol/L — AB (ref 3.5–5.1)
SODIUM: 137 mmol/L (ref 135–145)
Total Protein: 6.9 g/dL (ref 6.5–8.1)

## 2016-01-11 LAB — URINALYSIS, ROUTINE W REFLEX MICROSCOPIC
Bilirubin Urine: NEGATIVE
GLUCOSE, UA: NEGATIVE mg/dL
Ketones, ur: NEGATIVE mg/dL
LEUKOCYTES UA: NEGATIVE
Nitrite: NEGATIVE
Protein, ur: NEGATIVE mg/dL
SPECIFIC GRAVITY, URINE: 1.015 (ref 1.005–1.030)
pH: 5.5 (ref 5.0–8.0)

## 2016-01-11 LAB — BRAIN NATRIURETIC PEPTIDE: B Natriuretic Peptide: 12 pg/mL (ref 0.0–100.0)

## 2016-01-11 MED ORDER — FUROSEMIDE 20 MG PO TABS: 20.0000 mg | ORAL_TABLET | Freq: Every day | ORAL | Status: DC

## 2016-01-11 MED ORDER — POTASSIUM CHLORIDE ER 20 MEQ PO TBCR: 20.0000 meq | EXTENDED_RELEASE_TABLET | Freq: Every day | ORAL | Status: DC

## 2016-01-11 NOTE — ED Notes (Signed)
Pt requesting pain medication.  MD notified.

## 2016-01-11 NOTE — Discharge Instructions (Signed)

## 2016-01-11 NOTE — ED Provider Notes (Signed)
CSN: [id number]     Arrival date & time 01/11/16  1251 History   First MD Initiated Contact with Patient 01/11/16 1300     Chief Complaint  Patient presents with  . Edema      The history is provided by the patient.  Patient presents with swelling in both her feet and lower legs. Began 2 days ago while at work. States by the end of her shift her feet and swollen somewhat she couldn't wear shoes. States she has had these off work since embolus. Back to work tomorrow. Has slight shortness of breath with it. Has not had swelling like this in the past. No chest pain. No fevers. States she has had increased urination and has been waking up at night to urinate. States she also what the bed last night. This is unusual for her. She states she is on no medications.  Past Medical History  Diagnosis Date  . Hypertension   . Gastric ulcer   . Hepatitis C   . Hyperlipidemia   . Liver failure (HCC)   . Tachycardia   . Generalized anxiety disorder   . Peripheral neuropathy (HCC)   . Kidney stones   . Chronic pain syndrome    Past Surgical History  Procedure Laterality Date  . Rt foot surgery    . Foot surgery    . Tubal ligation     Family History  Problem Relation Age of Onset  . Heart disease Mother   . Diabetes Mother   . Peripheral Artery Disease Father    Social History  Substance Use Topics  . Smoking status: Current Some Day Smoker -- 0.50 packs/day for 24 years    Types: Cigarettes  . Smokeless tobacco: Never Used  . Alcohol Use: No   OB History    Gravida Para Term Preterm AB TAB SAB Ectopic Multiple Living   Review of Systems  Constitutional: Negative for activity change, appetite change and unexpected weight change.  Eyes: Negative for pain.  Respiratory: Positive for shortness of breath. Negative for chest tightness.   Cardiovascular: Positive for leg swelling. Negative for chest pain.  Gastrointestinal: Negative for nausea, vomiting, abdominal  pain and diarrhea.  Endocrine: Positive for polyuria.  Genitourinary: Negative for flank pain and vaginal bleeding.  Musculoskeletal: Negative for back pain and neck stiffness.  Skin: Negative for rash.  Neurological: Negative for weakness, numbness and headaches.  Psychiatric/Behavioral: Negative for behavioral problems.      Allergies  Aspirin; Ibuprofen; Tramadol; and Ancef  Home Medications   Prior to Admission medications   Medication Sig Start Date End Date Taking? Authorizing Provider  acetaminophen (TYLENOL) 500 MG tablet Take 500 mg by mouth every 6 (six) hours as needed for mild pain or moderate pain.   Yes Historical Provider, MD  benzocaine (ORAJEL) 10 % mucosal gel Use as directed 1 application in the mouth or throat as needed for mouth pain.   Yes Historical Provider, MD  naproxen sodium (ANAPROX) 220 MG tablet Take 220 mg by mouth daily as needed (pain).   Yes Historical Provider, MD  furosemide (LASIX) 20 MG tablet Take 1 tablet (20 mg total) by mouth daily. 01/11/16   Benjiman Core, MD  methocarbamol (ROBAXIN) 500 MG tablet Take 1 tablet (500 mg total) by mouth 2 (two) times daily as needed for muscle spasms. Patient not taking: Reported on 01/11/2016 08/28/15   Eber Hong, MD  potassium chloride 20 MEQ TBCR Take 20 mEq by mouth daily. 01/11/16   Benjiman CoreNathan Anvita Hirata, MD   BP 125/72 mmHg  Pulse 92  Temp(Src) 98.2 F (36.8 C) (Oral)  Resp 18  Ht 5\' 1"  (1.549 m)  Wt 168 lb (76.204 kg)  BMI 31.76 kg/m2  SpO2 97%  LMP 12/17/2015 Physical Exam  Constitutional: She appears well-developed.  Patient is overweight  HENT:  Head: Atraumatic.  Neck: Neck supple.  Cardiovascular: Normal rate.   Pulmonary/Chest: Effort normal.  Abdominal: Soft. There is no tenderness.  Musculoskeletal: She exhibits edema.  Pitting edema bilateral lower extremities. It is in both feet and both lower legs. No rash. No color change.  Skin: Skin is warm.    ED Course  Procedures  (including critical care time) Labs Review Labs Reviewed  COMPREHENSIVE METABOLIC PANEL - Abnormal; Notable for the following:    Potassium 3.4 (*)    Glucose, Bld 109 (*)    Calcium 8.7 (*)    All other components within normal limits  URINALYSIS, ROUTINE W REFLEX MICROSCOPIC (NOT AT [facility]) - Abnormal; Notable for the following:    Hgb urine dipstick SMALL (*)    All other components within normal limits  CBC WITH DIFFERENTIAL/PLATELET - Abnormal; Notable for the following:    Hemoglobin 11.4 (*)    HCT 35.3 (*)    All other components within normal limits  URINE MICROSCOPIC-ADD ON - Abnormal; Notable for the following:    Squamous Epithelial / LPF 0-5 (*)    All other components within normal limits  BRAIN NATRIURETIC PEPTIDE    Imaging Review Dg Chest 2 View  01/11/2016  CLINICAL DATA:  Bilateral lower extremity swelling. EXAM: CHEST  2 VIEW COMPARISON:  Radiographs of June 07, 2009. FINDINGS: The heart size and mediastinal contours are within normal limits. Both lungs are clear. No pneumothorax or pleural effusion is noted. The visualized skeletal structures are unremarkable. IMPRESSION: No active cardiopulmonary disease. Electronically Signed   By: Lupita RaiderJames  Green Jr, M.D.   On: 01/11/2016 14:14   I have personally reviewed and evaluated these images and lab results as part of my medical decision-making.   EKG Interpretation None      MDM   Final diagnoses:  Peripheral edema    Patient presents with swelling in her legs. States it began a couple days ago. Does not appear to infection. Kidney function looks good. Has had urinary frequency but does not appear to be severely hyperglycemic or infection. Liver function good HEPATITIS C. Does not appear to be red CHF. Will give fluid pills and discharge home. We'll need to follow with primary care doctor. Doubt DVT since it is symmetric and bilateral.    Benjiman CoreNathan Sigmund Morera, MD 01/11/16 1556

## 2016-01-11 NOTE — ED Notes (Signed)
Patient c/o swelling to lower legs bilaterally, more notable in right leg. Per patient started at work Thursday night. Patient denies any hx of edema in past. Patient reports keeping legs elevated with no relief. Per patient "slightly short of breath."

## 2016-02-08 ENCOUNTER — Emergency Department (HOSPITAL_COMMUNITY)
Admission: EM | Admit: 2016-02-08 | Discharge: 2016-02-08 | Disposition: A | Discharge status: Home / Self Care | Payer: Self-pay | Attending: Emergency Medicine | Admitting: Emergency Medicine

## 2016-02-08 ENCOUNTER — Encounter (HOSPITAL_COMMUNITY): Payer: Self-pay | Admitting: Emergency Medicine

## 2016-02-08 DIAGNOSIS — I10 Essential (primary) hypertension: Secondary | ICD-10-CM | POA: Insufficient documentation

## 2016-02-08 DIAGNOSIS — F17210 Nicotine dependence, cigarettes, uncomplicated: Secondary | ICD-10-CM | POA: Insufficient documentation

## 2016-02-08 DIAGNOSIS — R600 Localized edema: Secondary | ICD-10-CM | POA: Insufficient documentation

## 2016-02-08 DIAGNOSIS — L03116 Cellulitis of left lower limb: Secondary | ICD-10-CM | POA: Insufficient documentation

## 2016-02-08 DIAGNOSIS — Z79899 Other long term (current) drug therapy: Secondary | ICD-10-CM | POA: Insufficient documentation

## 2016-02-08 DIAGNOSIS — L03115 Cellulitis of right lower limb: Secondary | ICD-10-CM | POA: Insufficient documentation

## 2016-02-08 DIAGNOSIS — F1910 Other psychoactive substance abuse, uncomplicated: Secondary | ICD-10-CM | POA: Insufficient documentation

## 2016-02-08 DIAGNOSIS — E785 Hyperlipidemia, unspecified: Secondary | ICD-10-CM | POA: Insufficient documentation

## 2016-02-08 LAB — URINALYSIS, ROUTINE W REFLEX MICROSCOPIC
BILIRUBIN URINE: NEGATIVE
GLUCOSE, UA: NEGATIVE mg/dL
Hgb urine dipstick: NEGATIVE
KETONES UR: NEGATIVE mg/dL
LEUKOCYTES UA: NEGATIVE
Nitrite: NEGATIVE
PH: 5.5 (ref 5.0–8.0)
Protein, ur: NEGATIVE mg/dL
Specific Gravity, Urine: 1.025 (ref 1.005–1.030)

## 2016-02-08 LAB — CBC WITH DIFFERENTIAL/PLATELET
Basophils Absolute: 0 10*3/uL (ref 0.0–0.1)
Basophils Relative: 1 %
EOS ABS: 0.2 10*3/uL (ref 0.0–0.7)
Eosinophils Relative: 4 %
HEMATOCRIT: 37.6 % (ref 36.0–46.0)
HEMOGLOBIN: 12 g/dL (ref 12.0–15.0)
LYMPHS ABS: 2.9 10*3/uL (ref 0.7–4.0)
LYMPHS PCT: 43 %
MCH: 28.2 pg (ref 26.0–34.0)
MCHC: 31.9 g/dL (ref 30.0–36.0)
MCV: 88.5 fL (ref 78.0–100.0)
Monocytes Absolute: 0.5 10*3/uL (ref 0.1–1.0)
Monocytes Relative: 7 %
NEUTROS ABS: 3.1 10*3/uL (ref 1.7–7.7)
NEUTROS PCT: 45 %
Platelets: 198 10*3/uL (ref 150–400)
RBC: 4.25 MIL/uL (ref 3.87–5.11)
RDW: 15 % (ref 11.5–15.5)
WBC: 6.7 10*3/uL (ref 4.0–10.5)

## 2016-02-08 LAB — BASIC METABOLIC PANEL
Anion gap: 7 (ref 5–15)
BUN: 10 mg/dL (ref 6–20)
CALCIUM: 8.6 mg/dL — AB (ref 8.9–10.3)
CHLORIDE: 101 mmol/L (ref 101–111)
CO2: 29 mmol/L (ref 22–32)
CREATININE: 0.68 mg/dL (ref 0.44–1.00)
GFR calc Af Amer: >60 mL/min (ref >60)
GFR calc non Af Amer: >60 mL/min (ref >60)
Glucose, Bld: 98 mg/dL (ref 65–99)
POTASSIUM: 3.6 mmol/L (ref 3.5–5.1)
SODIUM: 137 mmol/L (ref 135–145)

## 2016-02-08 LAB — RAPID URINE DRUG SCREEN, HOSP PERFORMED
AMPHETAMINES: POSITIVE — AB
BARBITURATES: NOT DETECTED
BENZODIAZEPINES: POSITIVE — AB
Cocaine: POSITIVE — AB
Opiates: NOT DETECTED
Tetrahydrocannabinol: NOT DETECTED

## 2016-02-08 LAB — PREGNANCY, URINE: Preg Test, Ur: NEGATIVE

## 2016-02-08 MED ORDER — VANCOMYCIN HCL IN DEXTROSE 1-5 GM/200ML-% IV SOLN
1000.0000 mg | Freq: Once | INTRAVENOUS | Status: AC
  Administered 2016-02-08: 1000 mg via INTRAVENOUS
  Filled 2016-02-08: qty 200

## 2016-02-08 MED ORDER — FUROSEMIDE 10 MG/ML IJ SOLN
60.0000 mg | Freq: Once | INTRAMUSCULAR | Status: AC
  Administered 2016-02-08: 60 mg via INTRAVENOUS
  Filled 2016-02-08: qty 6

## 2016-02-08 MED ORDER — FUROSEMIDE 20 MG PO TABS: 20.0000 mg | ORAL_TABLET | Freq: Every day | ORAL | Status: DC

## 2016-02-08 MED ORDER — POTASSIUM CHLORIDE ER 10 MEQ PO TBCR: 10.0000 meq | EXTENDED_RELEASE_TABLET | Freq: Every day | ORAL | Status: DC

## 2016-02-08 MED ORDER — SULFAMETHOXAZOLE-TRIMETHOPRIM 800-160 MG PO TABS: 1.0000 | ORAL_TABLET | Freq: Two times a day (BID) | ORAL | Status: AC

## 2016-02-08 NOTE — ED Provider Notes (Signed)
CSN: [id number]     Arrival date & time 02/08/16  1042 History   First MD Initiated Contact with Patient 02/08/16 1159     Chief Complaint  Patient presents with  . Leg Swelling   Pt is a 40 yo wf who has a hx of leg edema (seen in this ed for edema on 6/17 and was given lasix).  She said her legs have been more swollen for the past few days.  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  She said they have gotten more swollen and now they are red.  Pt denies any fever.  (Consider location/radiation/quality/duration/timing/severity/associated sxs/prior Treatment) The history is provided by the patient.    Past Medical History  Diagnosis Date  . Hypertension   . Gastric ulcer   . Hepatitis C   . Hyperlipidemia   . Liver failure (HCC)   . Tachycardia   . Generalized anxiety disorder   . Peripheral neuropathy (HCC)   . Kidney stones   . Chronic pain syndrome    Past Surgical History  Procedure Laterality Date  . Rt foot surgery    . Foot surgery    . Tubal ligation     Family History  Problem Relation Age of Onset  . Heart disease Mother   . Diabetes Mother   . Peripheral Artery Disease Father    Social History  Substance Use Topics  . Smoking status: Current Some Day Smoker -- 0.50 packs/day for 24 years    Types: Cigarettes  . Smokeless tobacco: Never Used  . Alcohol Use: No   OB History    Gravida Para Term Preterm AB TAB SAB Ectopic Multiple Living   Review of Systems  Skin: Positive for rash.  All other systems reviewed and are negative.     Allergies  Aspirin; Ibuprofen; Tramadol; and Ancef  Home Medications   Prior to Admission medications   Medication Sig Start Date End Date Taking? Authorizing Provider  acetaminophen (TYLENOL) 500 MG tablet Take 500 mg by mouth every 6 (six) hours as needed for mild pain or moderate pain.   Yes Historical Provider, MD  benzocaine (ORAJEL) 10 % mucosal gel Use as directed 1 application in  the mouth or throat as needed for mouth pain.   Yes Historical Provider, MD  OVER THE COUNTER MEDICATION Take 1 application by mouth daily as needed (tooth pain).   Yes Historical Provider, MD  furosemide (LASIX) 20 MG tablet Take 1 tablet (20 mg total) by mouth daily. 02/08/16   Jacalyn Lefevre, MD  methocarbamol (ROBAXIN) 500 MG tablet Take 1 tablet (500 mg total) by mouth 2 (two) times daily as needed for muscle spasms. Patient not taking: Reported on 01/11/2016 08/28/15   Eber Hong, MD  potassium chloride (K-DUR) 10 MEQ tablet Take 1 tablet (10 mEq total) by mouth daily. 02/08/16   Jacalyn Lefevre, MD  sulfamethoxazole-trimethoprim (BACTRIM DS,SEPTRA DS) 800-160 MG tablet Take 1 tablet by mouth 2 (two) times daily. 02/08/16 02/15/16  Jacalyn Lefevre, MD   BP 124/74 mmHg  Pulse 93  Temp(Src) 97.9 F (36.6 C) (Oral)  Resp 18  Ht  (1.549 m)  Wt 182 lb (82.555 kg)  BMI 34.41 kg/m2  SpO2 100%  LMP 12/28/2015 Physical Exam  Constitutional: She is oriented to person, place, and time. She appears well-developed and well-nourished.  Somnolent, but easily aroused  HENT:  Head: Normocephalic and atraumatic.  Right Ear: External ear normal.  Left Ear: External ear normal.  Nose: Nose normal.  Mouth/Throat: Oropharynx is clear and moist.  Eyes: Conjunctivae and EOM are normal. Pupils are equal, round, and reactive to light.  Neck: Normal range of motion. Neck supple.  Cardiovascular: Normal rate, regular rhythm, normal heart sounds and intact distal pulses.   Pulmonary/Chest: Effort normal and breath sounds normal.  Abdominal: Soft. Bowel sounds are normal.  Musculoskeletal: She exhibits edema.  Bilateral lower extremities with edema and cellulitis.  They each also have some lymphangitis.  Neurological: She is oriented to person, place, and time.  Skin: Rash noted.  Psychiatric: She has a normal mood and affect. Judgment and thought content normal. She is slowed.  Nursing note and vitals  reviewed.   ED Course  Procedures (including critical care time) Labs Review Labs Reviewed  BASIC METABOLIC PANEL - Abnormal; Notable for the following:    Calcium 8.6 (*)    All other components within normal limits  URINE RAPID DRUG SCREEN, HOSP PERFORMED - Abnormal; Notable for the following:    Cocaine POSITIVE (*)    Benzodiazepines POSITIVE (*)    Amphetamines POSITIVE (*)    All other components within normal limits  CBC WITH DIFFERENTIAL/PLATELET  URINALYSIS, ROUTINE W REFLEX MICROSCOPIC (NOT AT [facility])  PREGNANCY, URINE    Imaging Review No results found. I have personally reviewed and evaluated these images and lab results as part of my medical decision-making.   EKG Interpretation None      MDM  I spoke with pt about the results of her drug test and told her those drugs were not helping her, they were hurting her.  She will not be getting any narcotics for pain and she is told to take tylenol.  Pt will be put on lasix and bactrim.  She is instr to return if worse.  F/u with pcp.  Final diagnoses:  Polysubstance abuse  Bilateral edema of lower extremity  Bilateral cellulitis of lower leg       Jacalyn LefevreJulie Renuka Farfan, MD 02/08/16 1329

## 2016-02-08 NOTE — ED Notes (Signed)
Patient c/o bilateral swelling and redness in legs. Per patient started yesterday. Patient states she has had swelling in legs before in which she has had to take Lasix but swelling has never been this significant. Patient states "The swelling is so much that my feet are breaking open and bleeding." Right leg warm to touch. Unsure of fever.

## 2016-02-08 NOTE — Discharge Instructions (Signed)
Cellulitis °Cellulitis is an infection of the skin and the tissue under the skin. The infected area is usually red and tender. This happens most often in the arms and lower legs. °HOME CARE  °· Take your antibiotic medicine as told. Finish the medicine even if you start to feel better. °· Keep the infected arm or leg raised (elevated). °· Put a warm cloth on the area up to 4 times per day. °· Only take medicines as told by your doctor. °· Keep all doctor visits as told. °GET HELP IF: °· You see red streaks on the skin coming from the infected area. °· Your red area gets bigger or turns a dark color. °· Your bone or joint under the infected area is painful after the skin heals. °· Your infection comes back in the same area or different area. °· You have a puffy (swollen) bump in the infected area. °· You have new symptoms. °· You have a fever. °GET HELP RIGHT AWAY IF:  °· You feel very sleepy. °· You throw up (vomit) or have watery poop (diarrhea). °· You feel sick and have muscle aches and pains. °  °This information is not intended to replace advice given to you by your health care provider. Make sure you discuss any questions you have with your health care provider. °  °Document Released: 12/30/2007 Document Revised: 04/03/2015 Document Reviewed: 09/28/2011 °Elsevier Interactive Patient Education ©2016 [employer] ° °Edema °Edema is an abnormal buildup of fluids. It is more common in your legs and thighs. Painless swelling of the feet and ankles is more likely as a person ages. It also is common in looser skin, like around your eyes. °HOME CARE  °· Keep the affected body part above the level of the heart while lying down. °· Do not sit still or stand for a long time. °· Do not put anything right under your knees when you lie down. °· Do not wear tight clothes on your upper legs. °· Exercise your legs to help the puffiness (swelling) go down. °· Wear elastic bandages or support stockings as told by your  doctor. °· A low-salt diet may help lessen the puffiness. °· Only take medicine as told by your doctor. °GET HELP IF: °· Treatment is not working. °· You have heart, liver, or kidney disease and notice that your skin looks puffy or shiny. °· You have puffiness in your legs that does not get better when you raise your legs. °· You have sudden weight gain for no reason. °GET HELP RIGHT AWAY IF:  °· You have shortness of breath or chest pain. °· You cannot breathe when you lie down. °· You have pain, redness, or warmth in the areas that are puffy. °· You have heart, liver, or kidney disease and get edema all of a sudden. °· You have a fever and your symptoms get worse all of a sudden. °MAKE SURE YOU:  °· Understand these instructions. °· Will watch your condition. °· Will get help right away if you are not doing well or get worse. °  °This information is not intended to replace advice given to you by your health care provider. Make sure you discuss any questions you have with your health care provider. °  °Document Released: 12/30/2007 Document Revised: 07/18/2013 Document Reviewed: 05/05/2013 °Elsevier Interactive Patient Education ©2016 [employer] Inc. ° °

## 2016-05-05 ENCOUNTER — Emergency Department (HOSPITAL_COMMUNITY)
Admission: EM | Admit: 2016-05-05 | Discharge: 2016-05-05 | Disposition: A | Discharge status: Home / Self Care | Payer: Self-pay | Attending: Emergency Medicine | Admitting: Emergency Medicine

## 2016-05-05 ENCOUNTER — Encounter (HOSPITAL_COMMUNITY): Payer: Self-pay | Admitting: Emergency Medicine

## 2016-05-05 ENCOUNTER — Emergency Department (HOSPITAL_COMMUNITY): Payer: Self-pay

## 2016-05-05 DIAGNOSIS — Z79899 Other long term (current) drug therapy: Secondary | ICD-10-CM | POA: Insufficient documentation

## 2016-05-05 DIAGNOSIS — Y9389 Activity, other specified: Secondary | ICD-10-CM | POA: Insufficient documentation

## 2016-05-05 DIAGNOSIS — Y929 Unspecified place or not applicable: Secondary | ICD-10-CM | POA: Insufficient documentation

## 2016-05-05 DIAGNOSIS — W228XXA Striking against or struck by other objects, initial encounter: Secondary | ICD-10-CM | POA: Insufficient documentation

## 2016-05-05 DIAGNOSIS — S5011XA Contusion of right forearm, initial encounter: Secondary | ICD-10-CM | POA: Insufficient documentation

## 2016-05-05 DIAGNOSIS — S5010XA Contusion of unspecified forearm, initial encounter: Secondary | ICD-10-CM

## 2016-05-05 DIAGNOSIS — Y999 Unspecified external cause status: Secondary | ICD-10-CM | POA: Insufficient documentation

## 2016-05-05 DIAGNOSIS — F17210 Nicotine dependence, cigarettes, uncomplicated: Secondary | ICD-10-CM | POA: Insufficient documentation

## 2016-05-05 DIAGNOSIS — I10 Essential (primary) hypertension: Secondary | ICD-10-CM | POA: Insufficient documentation

## 2016-05-05 MED ORDER — ACETAMINOPHEN 500 MG PO TABS
1000.0000 mg | ORAL_TABLET | Freq: Once | ORAL | Status: AC
  Administered 2016-05-05: 1000 mg via ORAL
  Filled 2016-05-05: qty 2

## 2016-05-05 NOTE — ED Notes (Addendum)
Pt stated she was putting groceries in [employer] when her significant other shut the back hatch on her right wrist. Pt does have some swelling to the right wrist with a bruise on the backside of the wrist. Pt has good grips and strength. Pt requesting a work note for 2 days.

## 2016-05-05 NOTE — ED Notes (Signed)
While getting patient to sign discharge this nurse hear patient's significant other state, "I don't feel it yet." Patient then replied that, "I told you to do it when I did it." Patient then stated, "That was some good weed. They laced that stuff right." Patient was discharged after being given tylenol for pain.

## 2016-05-05 NOTE — ED Notes (Signed)
Patient transported to X-ray 

## 2016-05-05 NOTE — ED Triage Notes (Signed)
Pt reports the back hatch of her car fell on her R wrist. Pt c/o pain and limited mobility.

## 2016-05-05 NOTE — ED Provider Notes (Signed)
[facility]-EMERGENCY DEPT Provider Note   CSN: [id number] Arrival date & time: 05/05/16  1251     History   Chief Complaint Chief Complaint  Patient presents with  . Wrist Pain    HPI Mary Jarvis is a 40 y.o. female.  Patient is a 40 year old female who presents to the emergency department with complaint of wrist forearm pain.  The patient states that Sunday night (October 8) the hatch of her car was closed on her right wrist forearm area. The patient states that she tried over-the-counter medications and treatments, but had pain when performing her duties at work. She presents now to make sure that she does not have a fracture or other problems that she should be concerned about.   The history is provided by the patient.  Wrist Pain     Past Medical History:  Diagnosis Date  . Chronic pain syndrome   . Gastric ulcer   . Generalized anxiety disorder   . Hepatitis C   . Hyperlipidemia   . Hypertension   . Kidney stones   . Liver failure (HCC)   . Peripheral neuropathy (HCC)   . Tachycardia     Patient Active Problem List   Diagnosis Date Noted  . Hepatitis C 12/12/2012  . 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 KNEE PAIN, LEFT 09/29/2007    Past Surgical History:  Procedure Laterality Date  . FOOT SURGERY    . rt foot surgery    . TUBAL LIGATION      OB History    Gravida Para Term Preterm AB Living   2 2 2     2    SAB TAB Ectopic Multiple Live Births                   Home Medications    Prior to Admission medications   Medication Sig Start Date End Date Taking? Authorizing Provider  acetaminophen (TYLENOL) 500 MG tablet Take 500 mg by mouth every 6 (six) hours as needed for mild pain or moderate pain.    Historical Provider, MD  benzocaine (ORAJEL) 10 % mucosal gel Use as directed 1 application in the mouth or throat as needed for mouth pain.    Historical Provider, MD  furosemide (LASIX) 20 MG tablet Take 1 tablet (20 mg total) by mouth  daily. 02/08/16   Jacalyn Lefevre, MD  methocarbamol (ROBAXIN) 500 MG tablet Take 1 tablet (500 mg total) by mouth 2 (two) times daily as needed for muscle spasms. Patient not taking: Reported on 01/11/2016 08/28/15   Eber Hong, MD  OVER THE COUNTER MEDICATION Take 1 application by mouth daily as needed (tooth pain).    Historical Provider, MD  potassium chloride (K-DUR) 10 MEQ tablet Take 1 tablet (10 mEq total) by mouth daily. 02/08/16   Jacalyn Lefevre, MD    Family History Family History  Problem Relation Age of Onset  . Heart disease Mother   . Diabetes Mother   . Peripheral Artery Disease Father     Social History Social History  Substance Use Topics  . Smoking status: Current Some Day Smoker    Packs/day: 0.50    Years: 24.00    Types: Cigarettes  . Smokeless tobacco: Never Used  . Alcohol use No     Allergies   Aspirin; Ibuprofen; Tramadol; and Ancef [cefazolin]   Review of Systems Review of Systems  Musculoskeletal: Positive for arthralgias and myalgias.  All other systems reviewed and are negative.    Physical  Exam   Updated Vital Signs BP 137/83 (BP Location: Left Arm)   Pulse 109   Temp 98.2 F (36.8 C) (Oral)   Resp 20   Ht 5\' 3"  (1.6 m)   Wt 74.8 kg   LMP 04/23/2016   SpO2 98%   BMI 29.23 kg/m   Physical Exam  Constitutional: She is oriented to person, place, and time. She appears well-developed and well-nourished.  Non-toxic appearance.  HENT:  Head: Normocephalic.  Right Ear: Tympanic membrane and external ear normal.  Left Ear: Tympanic membrane and external ear normal.  Eyes: EOM and lids are normal. Pupils are equal, round, and reactive to light.  Neck: Normal range of motion. Neck supple. Carotid bruit is not present.  Cardiovascular: Normal rate, regular rhythm, normal heart sounds, intact distal pulses and normal pulses.   Pulmonary/Chest: Breath sounds normal. No respiratory distress.  Abdominal: Soft. Bowel sounds are normal. There  is no tenderness. There is no guarding.  Musculoskeletal: Normal range of motion.  There is full range of motion of the right shoulder and elbow. There is bruising and soreness of the forearm, there is full range of motion of the right wrist and fingers. Capillary refill is less than 2 seconds. The radial pulse is 2+.  Lymphadenopathy:       Head (right side): No submandibular adenopathy present.       Head (left side): No submandibular adenopathy present.    She has no cervical adenopathy.  Neurological: She is alert and oriented to person, place, and time. She has normal strength. No cranial nerve deficit or sensory deficit.  Skin: Skin is warm and dry.  Psychiatric: She has a normal mood and affect. Her speech is normal.  Nursing note and vitals reviewed.    ED Treatments / Results  Labs (all labs ordered are listed, but only abnormal results are displayed) Labs Reviewed - No data to display  EKG  EKG Interpretation None       Radiology Dg Wrist Complete Right  Result Date: 05/05/2016 CLINICAL DATA:  Initial encounter for Pt states she had a car hatch come down onto right forearm last night/bruising right forearm/pain medial aspect right wrist EXAM: RIGHT WRIST - COMPLETE 3+ VIEW COMPARISON:  Hand films 10/12/2014 FINDINGS: Scaphoid intact. Suspect soft tissue swelling about the a distal forearm, especially posteriorly. No acute fracture or dislocation. IMPRESSION: No acute osseous abnormality. Electronically Signed   By: Jeronimo GreavesKyle  Talbot M.D.   On: 05/05/2016 13:17    Procedures Procedures (including critical care time)  Medications Ordered in ED Medications  acetaminophen (TYLENOL) tablet 1,000 mg (not administered)     Initial Impression / Assessment and Plan / ED Course  I have reviewed the triage vital signs and the nursing notes.  Pertinent labs & imaging results that were available during my care of the patient were reviewed by me and considered in my medical  decision making (see chart for details).  Clinical Course    *I have reviewed nursing notes, vital signs, and all appropriate lab and imaging results for this patient.**  Final Clinical Impressions(s) / ED Diagnoses  X-ray is negative. There no neurovascular deficits appreciated on examination. The patient is fitted with an Ace wrap for support. The patient will use Tylenol for discomfort. A work note excusing the patient until October 13 has been given.    Final diagnoses:  None    New Prescriptions New Prescriptions   No medications on file     [facility]  Beverely Pace, PA-C 05/05/16 [address], PA-C 05/05/16 1429    Donnetta Hutching, MD 05/08/16 1345

## 2016-05-05 NOTE — Discharge Instructions (Signed)
Your vital signs are within normal limits. Your x-ray is negative for fracture or dislocation. Please use an Ace wrap to the area over the next 5-7 days. Use Tylenol for soreness. Please rest your arm is much as possible. Apply ice to the forearm.

## 2016-06-08 ENCOUNTER — Encounter (HOSPITAL_COMMUNITY): Payer: Self-pay | Admitting: Emergency Medicine

## 2016-06-08 ENCOUNTER — Emergency Department (HOSPITAL_COMMUNITY)
Admission: EM | Admit: 2016-06-08 | Discharge: 2016-06-08 | Disposition: A | Discharge status: Home / Self Care | Payer: Self-pay | Attending: Emergency Medicine | Admitting: Emergency Medicine

## 2016-06-08 DIAGNOSIS — Y93G3 Activity, cooking and baking: Secondary | ICD-10-CM | POA: Insufficient documentation

## 2016-06-08 DIAGNOSIS — X58XXXA Exposure to other specified factors, initial encounter: Secondary | ICD-10-CM | POA: Insufficient documentation

## 2016-06-08 DIAGNOSIS — M79642 Pain in left hand: Secondary | ICD-10-CM | POA: Insufficient documentation

## 2016-06-08 DIAGNOSIS — Z79899 Other long term (current) drug therapy: Secondary | ICD-10-CM | POA: Insufficient documentation

## 2016-06-08 DIAGNOSIS — Y990 Civilian activity done for income or pay: Secondary | ICD-10-CM | POA: Insufficient documentation

## 2016-06-08 DIAGNOSIS — F17210 Nicotine dependence, cigarettes, uncomplicated: Secondary | ICD-10-CM | POA: Insufficient documentation

## 2016-06-08 DIAGNOSIS — Y929 Unspecified place or not applicable: Secondary | ICD-10-CM | POA: Insufficient documentation

## 2016-06-08 DIAGNOSIS — M79641 Pain in right hand: Secondary | ICD-10-CM | POA: Insufficient documentation

## 2016-06-08 DIAGNOSIS — I10 Essential (primary) hypertension: Secondary | ICD-10-CM | POA: Insufficient documentation

## 2016-06-08 MED ORDER — PREDNISONE 10 MG PO TABS: 20.0000 mg | ORAL_TABLET | Freq: Two times a day (BID) | ORAL | 0 refills | Status: DC

## 2016-06-08 NOTE — Discharge Instructions (Signed)
Prednisone as prescribed.  Rest.  Follow-up with your primary Dr. if you're not improving in one week.

## 2016-06-08 NOTE — ED Provider Notes (Signed)
[facility] DEPT Provider Note   CSN: [id number] Arrival date & time: 06/08/16  1051  By signing my name below, I, Majel HomerPeyton Lee, attest that this documentation has been prepared under the direction and in the presence of Geoffery Lyonsouglas Minetta Krisher, MD . Electronically Signed: Majel HomerPeyton Lee, Scribe. 06/08/2016. 11:16 AM.  History   Chief Complaint Chief Complaint  Patient presents with  . Hand edema   The history is provided by the patient. No language interpreter was used.   HPI Comments: Mary Jarvis is a 40 y.o. female with PMHx of HTN and HLD, who presents to the Emergency Department complaining of gradually worsening, bilateral hand pain and swelling that began yesterday. 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 She notes she woke up yesterday with bilateral hand swelling and states it is now difficult for her to make a fist with either hand. She reports associated "pins and needles" in her bilateral hands. She notes she has already need to "have one of her rings cut off."   Past Medical History:  Diagnosis Date  . Chronic pain syndrome   . Gastric ulcer   . Generalized anxiety disorder   . Hepatitis C   . Hyperlipidemia   . Hypertension   . Kidney stones   . Liver failure (HCC)   . Peripheral neuropathy (HCC)   . Tachycardia     Patient Active Problem List   Diagnosis Date Noted  . Hepatitis C 12/12/2012  . 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 KNEE PAIN, LEFT 09/29/2007    Past Surgical History:  Procedure Laterality Date  . FOOT SURGERY    . rt foot surgery    . TUBAL LIGATION      OB History    Gravida Para Term Preterm AB Living   2 2 2     2    SAB TAB Ectopic Multiple Live Births                 Home Medications    Prior to Admission medications   Medication Sig Start Date End Date Taking? Authorizing Provider  acetaminophen (TYLENOL) 500 MG tablet Take 500 mg by mouth  every 6 (six) hours as needed for mild pain or moderate pain.    Historical Provider, MD  benzocaine (ORAJEL) 10 % mucosal gel Use as directed 1 application in the mouth or throat as needed for mouth pain.    Historical Provider, MD  furosemide (LASIX) 20 MG tablet Take 1 tablet (20 mg total) by mouth daily. 02/08/16   Jacalyn LefevreJulie Haviland, MD  methocarbamol (ROBAXIN) 500 MG tablet Take 1 tablet (500 mg total) by mouth 2 (two) times daily as needed for muscle spasms. Patient not taking: Reported on 01/11/2016 08/28/15   Eber HongBrian Miller, MD  OVER THE COUNTER MEDICATION Take 1 application by mouth daily as needed (tooth pain).    Historical Provider, MD  potassium chloride (K-DUR) 10 MEQ tablet Take 1 tablet (10 mEq total) by mouth daily. 02/08/16   Jacalyn LefevreJulie Haviland, MD    Family History Family History  Problem Relation Age of Onset  . Heart disease Mother   . Diabetes Mother   . Peripheral Artery Disease Father     Social History Social History  Substance Use Topics  . Smoking status: Current Some Day Smoker    Packs/day: 0.50    Years: 24.00    Types: Cigarettes  .  Smokeless tobacco: Never Used  . Alcohol use No     Allergies   Aspirin; Ibuprofen; Tramadol; and Ancef [cefazolin]   Review of Systems Review of Systems  Constitutional: Negative for fever.  Musculoskeletal: Positive for arthralgias and joint swelling (bilateral hands).   Physical Exam Updated Vital Signs BP 119/72 (BP Location: Left Arm)   Temp 98 F (36.7 C) (Oral)   Resp 20   Ht 5\' 4"  (1.626 m)   Wt 165 lb (74.8 kg)   LMP 05/27/2016   SpO2 100%   BMI 28.32 kg/m   Physical Exam  Constitutional: She is oriented to person, place, and time. She appears well-developed and well-nourished.  HENT:  Head: Normocephalic.  Eyes: EOM are normal.  Neck: Normal range of motion.  Pulmonary/Chest: Effort normal.  Abdominal: She exhibits no distension.  Musculoskeletal: Normal range of motion. She exhibits edema.  Both hands  appear grossly normal. She is able to flex and extend all fingers without discomfort. Sensation is intact. Capillary refill is brisk.   Neurological: She is alert and oriented to person, place, and time.  Psychiatric: She has a normal mood and affect.  Nursing note and vitals reviewed.  ED Treatments / Results  Labs (all labs ordered are listed, but only abnormal results are displayed) Labs Reviewed - No data to display  EKG  EKG Interpretation None       Radiology No results found.  Procedures Procedures (including critical care time)  Medications Ordered in ED Medications - No data to display  DIAGNOSTIC STUDIES:  Oxygen Saturation is 100% on RA, normal by my interpretation.    COORDINATION OF CARE:  11:12 AM Discussed treatment plan with pt at bedside and pt agreed to plan.  Initial Impression / Assessment and Plan / ED Course  I have reviewed the triage vital signs and the nursing notes.  Pertinent labs & imaging results that were available during my care of the patient were reviewed by me and considered in my medical decision making (see chart for details).  Clinical Course     Patient presents with complaints of hand numbness and swelling. I am unable to appreciate any significant swelling on exam. This may well be a bilateral carpal tunnel syndrome. She will be treated with anti-inflammatories and rest. She is to follow-up with her primary Dr. if not improving in the next week.  I personally performed the services described in this documentation, which was scribed in my presence. The recorded information has been reviewed and is accurate.   Final Clinical Impressions(s) / ED Diagnoses   Final diagnoses:  None    New Prescriptions New Prescriptions   No medications on file     Geoffery Lyonsouglas Sri Clegg, MD 06/08/16 1502

## 2016-06-08 NOTE — ED Triage Notes (Signed)
Pt reports she worked a 16 hour shift on Saturday, woke with bilat hand swelling on Sunday. Pt unable to make a fist with either hand, c/o "pins and needles" feeling.

## 2016-06-15 ENCOUNTER — Emergency Department (HOSPITAL_COMMUNITY): Payer: Self-pay

## 2016-06-15 ENCOUNTER — Emergency Department (HOSPITAL_COMMUNITY)
Admission: EM | Admit: 2016-06-15 | Discharge: 2016-06-15 | Disposition: A | Discharge status: Home / Self Care | Payer: Self-pay | Attending: Emergency Medicine | Admitting: Emergency Medicine

## 2016-06-15 ENCOUNTER — Encounter (HOSPITAL_COMMUNITY): Payer: Self-pay | Admitting: Emergency Medicine

## 2016-06-15 DIAGNOSIS — Y9301 Activity, walking, marching and hiking: Secondary | ICD-10-CM | POA: Insufficient documentation

## 2016-06-15 DIAGNOSIS — S82841A Displaced bimalleolar fracture of right lower leg, initial encounter for closed fracture: Secondary | ICD-10-CM | POA: Insufficient documentation

## 2016-06-15 DIAGNOSIS — Y929 Unspecified place or not applicable: Secondary | ICD-10-CM | POA: Insufficient documentation

## 2016-06-15 DIAGNOSIS — X501XXA Overexertion from prolonged static or awkward postures, initial encounter: Secondary | ICD-10-CM | POA: Insufficient documentation

## 2016-06-15 DIAGNOSIS — F17210 Nicotine dependence, cigarettes, uncomplicated: Secondary | ICD-10-CM | POA: Insufficient documentation

## 2016-06-15 DIAGNOSIS — Y999 Unspecified external cause status: Secondary | ICD-10-CM | POA: Insufficient documentation

## 2016-06-15 DIAGNOSIS — I10 Essential (primary) hypertension: Secondary | ICD-10-CM | POA: Insufficient documentation

## 2016-06-15 MED ORDER — OXYCODONE-ACETAMINOPHEN 5-325 MG PO TABS
ORAL_TABLET | ORAL | Status: DC
Start: 2016-06-15 — End: 2016-06-15
  Filled 2016-06-15: qty 1

## 2016-06-15 MED ORDER — OXYCODONE-ACETAMINOPHEN 5-325 MG PO TABS
1.0000 | ORAL_TABLET | Freq: Once | ORAL | Status: AC
  Administered 2016-06-15: 1 via ORAL

## 2016-06-15 MED ORDER — OXYCODONE-ACETAMINOPHEN 5-325 MG PO TABS: 1.0000 | ORAL_TABLET | ORAL | 0 refills | Status: DC | PRN

## 2016-06-15 NOTE — ED Triage Notes (Signed)
Pt fell when walking in leaves last night, right swollen, painful ankle

## 2016-06-15 NOTE — ED Notes (Signed)
Pt educated on crutches and gave return demonstration of use. Toes pink with good cap refill

## 2016-06-15 NOTE — ED Provider Notes (Signed)
[facility] Provider Note    By signing my name below, I, Earmon Phoenix, attest that this documentation has been prepared under the direction and in the presence of Burgess Amor, PA-C. Electronically Signed: Earmon Phoenix, ED Scribe. 06/15/16. 12:33 PM.   History   Chief Complaint Chief Complaint  Patient presents with  . Ankle Pain    The history is provided by the patient and medical records. No language interpreter was used.    HPI Comments:  Mary Jarvis is a 40 y.o. female who presents to the Emergency Department complaining of severe right ankle pain that began last night secondary to slipping and falling on some leaves. She reports associated swelling. She reports surgery to the right foot in the past (2008) but denies any previous injury to the right ankle. She has wrapped the ankle with an ace bandage but denies taking anything for pain. Bearing weight increases the pain. She denies alleviating factors. She denies numbness, tingling or weakness of the RLE or foot, bruising, wounds, head trauma, LOC.   Past Medical History:  Diagnosis Date  . Chronic pain syndrome   . Gastric ulcer   . Generalized anxiety disorder   . Hepatitis C   . Hyperlipidemia   . Hypertension   . Kidney stones   . Liver failure (HCC)   . Peripheral neuropathy (HCC)   . Tachycardia     Patient Active Problem List   Diagnosis Date Noted  . Hepatitis C 12/12/2012  . 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 KNEE PAIN, LEFT 09/29/2007    Past Surgical History:  Procedure Laterality Date  . FOOT SURGERY    . rt foot surgery    . TUBAL LIGATION      OB History    Gravida Para Term Preterm AB Living   2 2 2     2    SAB TAB Ectopic Multiple Live Births                   Home Medications    Prior to Admission medications   Medication Sig Start Date End Date Taking? Authorizing Provider  acetaminophen (TYLENOL) 500 MG tablet Take 500 mg by mouth every 6 (six)  hours as needed for mild pain or moderate pain.    Historical Provider, MD  benzocaine (ORAJEL) 10 % mucosal gel Use as directed 1 application in the mouth or throat as needed for mouth pain.    Historical Provider, MD  furosemide (LASIX) 20 MG tablet Take 1 tablet (20 mg total) by mouth daily. 02/08/16   Jacalyn Lefevre, MD  methocarbamol (ROBAXIN) 500 MG tablet Take 1 tablet (500 mg total) by mouth 2 (two) times daily as needed for muscle spasms. Patient not taking: Reported on 01/11/2016 08/28/15   Eber Hong, MD  OVER THE COUNTER MEDICATION Take 1 application by mouth daily as needed (tooth pain).    Historical Provider, MD  potassium chloride (K-DUR) 10 MEQ tablet Take 1 tablet (10 mEq total) by mouth daily. 02/08/16   Jacalyn Lefevre, MD  predniSONE (DELTASONE) 10 MG tablet Take 2 tablets (20 mg total) by mouth 2 (two) times daily. 06/08/16   Geoffery Lyons, MD    Family History Family History  Problem Relation Age of Onset  . Heart disease Mother   . Diabetes Mother   . Peripheral Artery Disease Father     Social History Social History  Substance Use Topics  . Smoking status: Current Some Day Smoker  Packs/day: 0.50    Years: 24.00    Types: Cigarettes  . Smokeless tobacco: Never Used  . Alcohol use No     Allergies   Aspirin; Ibuprofen; Tramadol; and Ancef [cefazolin]   Review of Systems Review of Systems  Constitutional: Negative for fever.  Musculoskeletal: Positive for arthralgias and joint swelling. Negative for myalgias.  Skin: Negative for color change and wound.  Neurological: Negative for syncope, weakness and numbness.     Physical Exam Updated Vital Signs BP 124/81   Pulse 93   Temp 99 F (37.2 C) (Temporal)   Resp 18   Ht 5' (1.524 m)   Wt 165 lb (74.8 kg)   LMP 05/27/2016   SpO2 97%   BMI 32.22 kg/m   Physical Exam  Constitutional: She appears well-developed and well-nourished.  HENT:  Head: Atraumatic.  Neck: Normal range of motion.    Cardiovascular:  DP pulse present.  Musculoskeletal: She exhibits edema and tenderness. She exhibits no deformity.  Tenderness to palpation over right lateral malleolus. Moderate edema over lateral malleolus. 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  Neurological: She is alert. She has normal strength. She displays normal reflexes. No sensory deficit.  Distal sensation intact.  Skin: Skin is warm and dry.  Psychiatric: She has a normal mood and affect.     ED Treatments / Results  DIAGNOSTIC STUDIES: Oxygen Saturation is 97% on RA, normal by my interpretation.   COORDINATION OF CARE: 12:17 PM- Will splint right ankle, order crutches and refer to orthopedics. Pt verbalizes understanding and agrees to plan.  Medications  oxyCODONE-acetaminophen (PERCOCET/ROXICET) 5-325 MG per tablet 1 tablet (1 tablet Oral Given 06/15/16 1145)    Labs (all labs ordered are listed, but only abnormal results are displayed) Labs Reviewed - No data to display  EKG  EKG Interpretation None       Radiology Dg Ankle Complete Right  Result Date: 06/15/2016 CLINICAL DATA:  Lateral right ankle pain and swelling. Twisted ankle walking down steps. EXAM: RIGHT ANKLE - COMPLETE 3+ VIEW COMPARISON:  None. FINDINGS: Comminuted, mildly displaced fracture through the distal right fibula. Minimally displaced fracture through the posterior malleolus of the tibia. Ankle mortise appears intact. Diffuse soft tissue swelling. IMPRESSION: Comminuted distal fibular fracture.  Posterior malleolar fracture. Electronically Signed   By: Charlett NoseKevin  Dover M.D.   On: 06/15/2016 10:50    Procedures Procedures (including critical care time)  Medications Ordered in ED Medications  oxyCODONE-acetaminophen (PERCOCET/ROXICET) 5-325 MG per tablet 1 tablet (1 tablet Oral Given 06/15/16 1145)     Initial Impression / Assessment and Plan / ED Course  I have reviewed the triage  vital signs and the nursing notes.  Pertinent labs & imaging results that were available during my care of the patient were reviewed by me and considered in my medical decision making (see chart for details).  Clinical Course     Short posterior splint with stirup provided, crutches, RICE, oxycodone.  Referral to Dr. Romeo AppleHarrison for definitive tx.  Advised need for f/u this week.  Pt understands plan and understands injury needs c/ose f/u. Recheck here in interim for any worsening sx.  I personally performed the services described in this documentation, which was scribed in my presence. The recorded information has been reviewed and is accurate.   Final Clinical Impressions(s) / ED Diagnoses   Final diagnoses:  Closed bimalleolar fracture of right ankle, initial encounter    New Prescriptions Discharge Medication  List as of 06/15/2016  1:17 PM    START taking these medications   Details  oxyCODONE-acetaminophen (PERCOCET/ROXICET) 5-325 MG tablet Take 1 tablet by mouth every 4 (four) hours as needed., Starting Mon 06/15/2016, Print         Burgess AmorJulie Laxmi Choung, PA-C 06/17/16 [id number]    Donnetta HutchingBrian Cook, MD 06/23/16 1137

## 2016-06-15 NOTE — Discharge Instructions (Signed)
You may take the oxycodone prescribed for pain relief.  This will make you drowsy - do not drive within 4 hours of taking this medication.  Ice and elevate your injury as much as possible over the next 24 hours to help minimize pain and swelling.  This injury may require surgery to repair and you need to followup with an orthopedic doctor (see the referral) for further management of this injury.

## 2016-06-17 ENCOUNTER — Ambulatory Visit (INDEPENDENT_AMBULATORY_CARE_PROVIDER_SITE_OTHER): Payer: Self-pay | Admitting: Orthopedic Surgery

## 2016-06-17 ENCOUNTER — Encounter: Payer: Self-pay | Admitting: Orthopedic Surgery

## 2016-06-17 DIAGNOSIS — S82841A Displaced bimalleolar fracture of right lower leg, initial encounter for closed fracture: Secondary | ICD-10-CM

## 2016-06-17 MED ORDER — HYDROCODONE-ACETAMINOPHEN 5-325 MG PO TABS: 1.0000 | ORAL_TABLET | Freq: Four times a day (QID) | ORAL | 0 refills | Status: DC | PRN

## 2016-06-17 NOTE — Patient Instructions (Signed)
OUT OF WORK X 8 WEEKS

## 2016-06-17 NOTE — Progress Notes (Signed)
Patient ID: Mary Jarvis, female   DOB: [birth year], 40 y.o.   MRN: [id number]  Chief Complaint  Patient presents with  . Ankle Injury    RIGHT ANKLE FRACTURE, DOI 06/15/16    HPI Mary NobleWendy Jarvis is a 40 y.o. female. Injury  The incident occurred 12 to 24 hours ago. The incident occurred at home. The injury mechanism was a fall. There is an injury to the right ankle (Right ankle fell down some steps at home). The pain is mild. Pertinent negatives include no abdominal pain, abnormal behavior, chest pain, coughing, difficulty breathing, numbness, seizures, visual disturbance or vomiting. There have been prior injuries to these areas (Lawnmower accident multiple surgeries on the foot).      Review of Systems Review of Systems  Eyes: Negative for visual disturbance.  Respiratory: Negative for cough.   Cardiovascular: Negative for chest pain.  Gastrointestinal: Negative for abdominal pain and vomiting.  Neurological: Negative for seizures and numbness.     Past Medical History:  Diagnosis Date  . Chronic pain syndrome   . Gastric ulcer   . Generalized anxiety disorder   . Hepatitis C   . Hyperlipidemia   . Hypertension   . Kidney stones   . Liver failure (HCC)   . Peripheral neuropathy (HCC)   . Tachycardia     Past Surgical History:  Procedure Laterality Date  . FOOT SURGERY    . rt foot surgery    . TUBAL LIGATION        Social History Social History  Substance Use Topics  . Smoking status: Current Some Day Smoker    Packs/day: 0.50    Years: 24.00    Types: Cigarettes  . Smokeless tobacco: Never Used  . Alcohol use No    Allergies  Allergen Reactions  . Aspirin Other (See Comments)    Cause bleeding have ulcers.  . Ibuprofen Other (See Comments)    Cause bleeding have ulcers  . Tramadol Nausea Only, Palpitations and Other (See Comments)    Cause bleeding have ulcers  . Ancef [Cefazolin] Hives    Current Outpatient Prescriptions  Medication Sig Dispense  Refill  . acetaminophen (TYLENOL) 500 MG tablet Take 500 mg by mouth every 6 (six) hours as needed for mild pain or moderate pain.    . benzocaine (ORAJEL) 10 % mucosal gel Use as directed 1 application in the mouth or throat as needed for mouth pain.    . furosemide (LASIX) 20 MG tablet Take 1 tablet (20 mg total) by mouth daily. 30 tablet 0  . methocarbamol (ROBAXIN) 500 MG tablet Take 1 tablet (500 mg total) by mouth 2 (two) times daily as needed for muscle spasms. 20 tablet 0  . OVER THE COUNTER MEDICATION Take 1 application by mouth daily as needed (tooth pain).    Marland Kitchen. oxyCODONE-acetaminophen (PERCOCET/ROXICET) 5-325 MG tablet Take 1 tablet by mouth every 4 (four) hours as needed. 20 tablet 0  . potassium chloride (K-DUR) 10 MEQ tablet Take 1 tablet (10 mEq total) by mouth daily. 30 tablet 0  . predniSONE (DELTASONE) 10 MG tablet Take 2 tablets (20 mg total) by mouth 2 (two) times daily. 20 tablet 0  . HYDROcodone-acetaminophen (NORCO/VICODIN) 5-325 MG tablet Take 1 tablet by mouth every 6 (six) hours as needed for moderate pain. 42 tablet 0   No current facility-administered medications for this visit.      LMP 05/27/2016   121/81 blood pressure pulse 88 respiratory rate 16  Physical Exam Last  menstrual period 05/27/2016. Physical Exam Ambulatory status Crutches for ambulation  Inspection tenderness medial posterior and lateral ankle swelling  Range of motion is painful range of motion right ankle throughout especially with plantar flexion and inversion   Stability tests deferred for pain  Motor exam no atrophy in the foot   Neurovascular examination is intact  Lymph node palpation is normal  The opposite left lower extremity exhibits normal range of motion stability and strength neurovascular exam is intact, lymph nodes are negative and there is no swelling or tenderness   Data Reviewed  independent image interpretation :  Comminuted fibular fracture small avulsion  posterior malleolus ankle mortise intact  Assessment    Bimalleolar ankle fracture    Plan    Short leg cast  X-ray in cast one week  Explained nature of injury if fracture displaces surgery required  Fuller CanadaStanley Lamarr Feenstra, MD 06/17/2016 11:21 AM

## 2016-06-24 ENCOUNTER — Ambulatory Visit (INDEPENDENT_AMBULATORY_CARE_PROVIDER_SITE_OTHER): Payer: Self-pay | Admitting: Orthopedic Surgery

## 2016-06-24 ENCOUNTER — Encounter: Payer: Self-pay | Admitting: Orthopedic Surgery

## 2016-06-24 ENCOUNTER — Ambulatory Visit (INDEPENDENT_AMBULATORY_CARE_PROVIDER_SITE_OTHER): Payer: Self-pay

## 2016-06-24 DIAGNOSIS — S82892D Other fracture of left lower leg, subsequent encounter for closed fracture with routine healing: Secondary | ICD-10-CM

## 2016-06-24 DIAGNOSIS — S82891D Other fracture of right lower leg, subsequent encounter for closed fracture with routine healing: Secondary | ICD-10-CM

## 2016-06-24 MED ORDER — HYDROCODONE-ACETAMINOPHEN 5-325 MG PO TABS: 1.0000 | ORAL_TABLET | Freq: Four times a day (QID) | ORAL | 0 refills | Status: DC | PRN

## 2016-06-24 NOTE — Patient Instructions (Addendum)
NO WEIGHT BEARING   NOTE RETURN TO WORK 2 DAYS A WEEK : PREP WORK

## 2016-06-24 NOTE — Progress Notes (Signed)
Patient ID: [name], female   DOB: [birth year], 40 y.o.   MRN: [id number]  FRACTURE CARE   Chief Complaint  Patient presents with  . Follow-up    RIGHT ANKLE FRACTURE, DOI 06/15/16   9 days post injury, x-rays.  Comminuted lateral malleolar fracture right ankle placed in short-leg cast.  XRAYS SHOW.  This is an independent x-ray reading and interpretation  The x-ray is 3 views right ankle and the findings are: Comminuted fracture lateral malleolus ankle mortise remains intact of note there is a posterior malleolar avulsion fracture. The lateral malleolus fracture is minimally angulated on lateral x-ray without displacement  Recommend continue casting and repeat x-ray in one week

## 2016-07-01 ENCOUNTER — Ambulatory Visit (INDEPENDENT_AMBULATORY_CARE_PROVIDER_SITE_OTHER): Payer: Self-pay

## 2016-07-01 ENCOUNTER — Ambulatory Visit (INDEPENDENT_AMBULATORY_CARE_PROVIDER_SITE_OTHER): Payer: Self-pay | Admitting: Orthopedic Surgery

## 2016-07-01 ENCOUNTER — Ambulatory Visit: Payer: Self-pay

## 2016-07-01 ENCOUNTER — Other Ambulatory Visit: Payer: Self-pay | Admitting: Orthopedic Surgery

## 2016-07-01 ENCOUNTER — Encounter: Payer: Self-pay | Admitting: Orthopedic Surgery

## 2016-07-01 DIAGNOSIS — S82891D Other fracture of right lower leg, subsequent encounter for closed fracture with routine healing: Secondary | ICD-10-CM

## 2016-07-01 DIAGNOSIS — S82892D Other fracture of left lower leg, subsequent encounter for closed fracture with routine healing: Secondary | ICD-10-CM

## 2016-07-01 MED ORDER — HYDROCODONE-ACETAMINOPHEN 5-325 MG PO TABS: 1.0000 | ORAL_TABLET | Freq: Four times a day (QID) | ORAL | 0 refills | Status: DC | PRN

## 2016-07-01 NOTE — Progress Notes (Signed)
Patient ID: Jerrye NobleWendy Demore, female   DOB: October 06, 1975, 40 y.o.   MRN: [id number]  Post op visit   Chief Complaint  Patient presents with  . Follow-up    RIGHT ANKLE FRACTURE, DOI 06/15/16    Cast intact  X-rays show posterior malleolar fracture and lateral malleolar fracture with intact ankle mortise  Patient will continue toe-touch weightbearing for balance and x-ray out of plaster 1 week and conversion to a Cam Walker  Encounter Diagnosis  Name Primary?  . Closed fracture of right ankle with routine healing, subsequent encounter Yes   Meds ordered this encounter  Medications  . HYDROcodone-acetaminophen (NORCO/VICODIN) 5-325 MG tablet    Sig: Take 1 tablet by mouth every 6 (six) hours as needed for moderate pain.    Dispense:  42 tablet    Refill:  0

## 2016-07-08 ENCOUNTER — Ambulatory Visit (INDEPENDENT_AMBULATORY_CARE_PROVIDER_SITE_OTHER): Payer: Self-pay

## 2016-07-08 ENCOUNTER — Ambulatory Visit (INDEPENDENT_AMBULATORY_CARE_PROVIDER_SITE_OTHER): Payer: Self-pay | Admitting: Orthopedic Surgery

## 2016-07-08 ENCOUNTER — Encounter: Payer: Self-pay | Admitting: Orthopedic Surgery

## 2016-07-08 DIAGNOSIS — S82891D Other fracture of right lower leg, subsequent encounter for closed fracture with routine healing: Secondary | ICD-10-CM

## 2016-07-08 MED ORDER — HYDROCODONE-ACETAMINOPHEN 5-325 MG PO TABS: 1.0000 | ORAL_TABLET | Freq: Four times a day (QID) | ORAL | 0 refills | Status: DC | PRN

## 2016-07-08 NOTE — Progress Notes (Signed)
Patient ID: [name], female   DOB: 06-25-1976, 40 y.o.   MRN: [id number]  FRACTURE FU  Chief Complaint  Patient presents with  . Follow-up    RT ANKLE FRACTURE: 06-15-16    23 DAYS POST INJURY   THE ANKLE LOOKS VERY GOOD   THE XRAYS LOOK LIKE THE FRACTURES ARE HEALING NORMALLY   CHANGE TO A CAM WALKER   XRAY IN 3 WEEKS

## 2016-07-14 ENCOUNTER — Ambulatory Visit: Payer: Self-pay | Admitting: Orthopedic Surgery

## 2016-07-31 ENCOUNTER — Encounter: Payer: Self-pay | Admitting: Orthopedic Surgery

## 2016-07-31 ENCOUNTER — Ambulatory Visit: Payer: Self-pay

## 2017-01-07 ENCOUNTER — Emergency Department (HOSPITAL_COMMUNITY)
Admission: EM | Admit: 2017-01-07 | Discharge: 2017-01-07 | Disposition: A | Discharge status: Home / Self Care | Payer: Self-pay | Attending: Emergency Medicine | Admitting: Emergency Medicine

## 2017-01-07 ENCOUNTER — Encounter (HOSPITAL_COMMUNITY): Payer: Self-pay | Admitting: Emergency Medicine

## 2017-01-07 DIAGNOSIS — F17210 Nicotine dependence, cigarettes, uncomplicated: Secondary | ICD-10-CM | POA: Insufficient documentation

## 2017-01-07 DIAGNOSIS — Z79899 Other long term (current) drug therapy: Secondary | ICD-10-CM | POA: Insufficient documentation

## 2017-01-07 DIAGNOSIS — I10 Essential (primary) hypertension: Secondary | ICD-10-CM | POA: Insufficient documentation

## 2017-01-07 DIAGNOSIS — K047 Periapical abscess without sinus: Secondary | ICD-10-CM | POA: Insufficient documentation

## 2017-01-07 MED ORDER — AMOXICILLIN 500 MG PO CAPS: 500.0000 mg | ORAL_CAPSULE | Freq: Three times a day (TID) | ORAL | 0 refills | Status: AC

## 2017-01-07 MED ORDER — AMOXICILLIN 250 MG PO CAPS
500.0000 mg | ORAL_CAPSULE | Freq: Once | ORAL | Status: AC
  Administered 2017-01-07: 500 mg via ORAL
  Filled 2017-01-07: qty 2

## 2017-01-07 MED ORDER — ACETAMINOPHEN 325 MG PO TABS
650.0000 mg | ORAL_TABLET | Freq: Once | ORAL | Status: AC
  Administered 2017-01-07: 650 mg via ORAL
  Filled 2017-01-07: qty 2

## 2017-01-07 NOTE — ED Triage Notes (Signed)
Right upper tooth pain, with facial swelling, sore throat, and ear pain.  Rates pain 7/10.

## 2017-01-07 NOTE — Discharge Instructions (Signed)
Complete your entire course of antibiotics as prescribed.   Avoid applying heat or ice to this abscess area which can worsen your symptoms.  You may use warm salt water swish and spit treatment or half peroxide and water swish and spit after meals to keep this area clean as discussed.  Call your dentist for further management of your symptoms.  You may take tylenol for pain relief.  Also, as discussed,  dental putty can help by making a temporary filling and will relieve you hot and cold sensitivity.

## 2017-01-07 NOTE — ED Provider Notes (Signed)
[facility] Provider Note   CSN: [id number] Arrival date & time: 01/07/17  4098     History   Chief Complaint Chief Complaint  Patient presents with  . Dental Pain    HPI Mary Jarvis is a 41 y.o. female presenting with a 2 day history of dental pain and gingival swelling.   The patient has a history of fracture of the tooth involved after chewing ice.  She endorses persistent pain, bad taste in her mouth coming from the tooth site along with right cheek pain and swelling.   There has been no fevers, chills, nausea or vomiting, also no complaint of difficulty swallowing, although chewing makes pain worse. She endorses hot and cold sensitivity.  The patient has had no medicines prior to arrival.  HPI  Past Medical History:  Diagnosis Date  . Chronic pain syndrome   . Gastric ulcer   . Generalized anxiety disorder   . Hepatitis C   . Hyperlipidemia   . Hypertension   . Kidney stones   . Liver failure (HCC)   . Peripheral neuropathy   . Tachycardia     Patient Active Problem List   Diagnosis Date Noted  . Hepatitis C 12/12/2012  . 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 KNEE PAIN, LEFT 09/29/2007    Past Surgical History:  Procedure Laterality Date  . FOOT SURGERY    . rt foot surgery    . TUBAL LIGATION      OB History    Gravida Para Term Preterm AB Living   2 2 2     2    SAB TAB Ectopic Multiple Live Births                   Home Medications    Prior to Admission medications   Medication Sig Start Date End Date Taking? Authorizing Provider  amoxicillin (AMOXIL) 500 MG capsule Take 1 capsule (500 mg total) by mouth 3 (three) times daily. 01/07/17 01/17/17  Burgess Amor, PA-C  HYDROcodone-acetaminophen (NORCO/VICODIN) 5-325 MG tablet Take 1 tablet by mouth every 6 (six) hours as needed for moderate pain. 07/08/16   Vickki Hearing, MD    Family History Family History  Problem Relation Age of Onset  . Heart disease Mother   .  Diabetes Mother   . Peripheral Artery Disease Father     Social History Social History  Substance Use Topics  . Smoking status: Current Some Day Smoker    Packs/day: 0.50    Years: 24.00    Types: Cigarettes  . Smokeless tobacco: Never Used  . Alcohol use No     Allergies   Aspirin; Ibuprofen; Tramadol; and Ancef [cefazolin]   Review of Systems Review of Systems  Constitutional: Negative for fever.  HENT: Positive for dental problem. Negative for facial swelling and sore throat.   Respiratory: Negative for shortness of breath.   Musculoskeletal: Negative for neck pain and neck stiffness.     Physical Exam Updated Vital Signs BP 135/71 (BP Location: Right Arm)   Pulse 71   Temp 97.7 F (36.5 C) (Oral)   Resp 16   Ht 5' (1.524 m)   Wt 76.2 kg (168 lb)   LMP 01/05/2017 (Exact Date)   SpO2 99%   BMI 32.81 kg/m   Physical Exam  Constitutional: She is oriented to person, place, and time. She appears well-developed and well-nourished. No distress.  HENT:  Head: Normocephalic and atraumatic.  Right Ear: Tympanic membrane and external ear  normal.  Left Ear: Tympanic membrane and external ear normal.  Mouth/Throat: Oropharynx is clear and moist and mucous membranes are normal. No oral lesions. No trismus in the jaw. Dental abscesses and dental caries present.    Sublingual space is soft.  Eyes: Conjunctivae are normal.  Neck: Normal range of motion. Neck supple.  Cardiovascular: Normal rate and normal heart sounds.   Pulmonary/Chest: Effort normal.  Abdominal: She exhibits no distension.  Musculoskeletal: Normal range of motion.  Lymphadenopathy:    She has no cervical adenopathy.  Neurological: She is alert and oriented to person, place, and time.  Skin: Skin is warm and dry. No erythema.  Psychiatric: She has a normal mood and affect.     ED Treatments / Results  Labs (all labs ordered are listed, but only abnormal results are displayed) Labs Reviewed -  No data to display  EKG  EKG Interpretation None       Radiology No results found.  Procedures Procedures (including critical care time)  Medications Ordered in ED Medications  amoxicillin (AMOXIL) capsule 500 mg (not administered)  acetaminophen (TYLENOL) tablet 650 mg (not administered)     Initial Impression / Assessment and Plan / ED Course  I have reviewed the triage vital signs and the nursing notes.  Pertinent labs & imaging results that were available during my care of the patient were reviewed by me and considered in my medical decision making (see chart for details).     Amoxil, tylenol, discussed temp dental putty for hot/cold sensitivity.  [facility], but no insurance until end of July.  Prn f/u anticipated.  Final Clinical Impressions(s) / ED Diagnoses   Final diagnoses:  Dental abscess    New Prescriptions New Prescriptions   AMOXICILLIN (AMOXIL) 500 MG CAPSULE    Take 1 capsule (500 mg total) by mouth 3 (three) times daily.     Burgess Amordol, Jerrie Schussler, PA-C 01/07/17 [id number]    Benjiman CorePickering, Nathan, MD 01/07/17 1500

## 2017-02-27 ENCOUNTER — Emergency Department (HOSPITAL_COMMUNITY): Payer: Self-pay

## 2017-02-27 ENCOUNTER — Emergency Department (HOSPITAL_COMMUNITY)
Admission: EM | Admit: 2017-02-27 | Discharge: 2017-02-27 | Disposition: A | Discharge status: Home / Self Care | Payer: Self-pay | Attending: Emergency Medicine | Admitting: Emergency Medicine

## 2017-02-27 ENCOUNTER — Encounter (HOSPITAL_COMMUNITY): Payer: Self-pay | Admitting: Cardiology

## 2017-02-27 DIAGNOSIS — I10 Essential (primary) hypertension: Secondary | ICD-10-CM | POA: Insufficient documentation

## 2017-02-27 DIAGNOSIS — F17210 Nicotine dependence, cigarettes, uncomplicated: Secondary | ICD-10-CM | POA: Insufficient documentation

## 2017-02-27 DIAGNOSIS — Y9389 Activity, other specified: Secondary | ICD-10-CM | POA: Insufficient documentation

## 2017-02-27 DIAGNOSIS — Y999 Unspecified external cause status: Secondary | ICD-10-CM | POA: Insufficient documentation

## 2017-02-27 DIAGNOSIS — X500XXA Overexertion from strenuous movement or load, initial encounter: Secondary | ICD-10-CM | POA: Insufficient documentation

## 2017-02-27 DIAGNOSIS — T148XXA Other injury of unspecified body region, initial encounter: Secondary | ICD-10-CM

## 2017-02-27 DIAGNOSIS — Y929 Unspecified place or not applicable: Secondary | ICD-10-CM | POA: Insufficient documentation

## 2017-02-27 LAB — POC URINE PREG, ED: PREG TEST UR: NEGATIVE

## 2017-02-27 MED ORDER — METHOCARBAMOL 500 MG PO TABS: 500.0000 mg | ORAL_TABLET | Freq: Three times a day (TID) | ORAL | 0 refills | Status: DC

## 2017-02-27 MED ORDER — HYDROCODONE-ACETAMINOPHEN 5-325 MG PO TABS: 1.0000 | ORAL_TABLET | ORAL | 0 refills | Status: DC | PRN

## 2017-02-27 NOTE — Discharge Instructions (Signed)
Your vital signs within normal limits. The x-ray of your lower back suggests muscle spasm, there is no fracture, and no dislocation appreciated. Your examination also suggests muscle spasm. Please use a heating pad to your thigh and lower back. You may also use hot tub soaks to help relax those muscles. Use Tylenol for mild pain. Use Robaxin 3 times daily for spasm pain. Please use Norco for more severe pain. Norco and Robaxin may cause drowsiness. Please do not drive, drink alcohol, operate machinery, or participate in activities requiring concentration when taking this medication.

## 2017-02-27 NOTE — ED Provider Notes (Addendum)
[facility] Provider Note   CSN: [id number] Arrival date & time: 02/27/17  1234     History   Chief Complaint Chief Complaint  Patient presents with  . Back Pain    HPI Mary Jarvis is a 41 y.o. female.   Back Pain   This is a new problem. The current episode started 6 to 12 hours ago. The problem occurs hourly. The problem has been gradually worsening. The pain is associated with lifting heavy objects. The pain is present in the lumbar spine. The quality of the pain is described as shooting and aching. The pain radiates to the left thigh. The pain is moderate. The symptoms are aggravated by bending and certain positions. The pain is the same all the time. Pertinent negatives include no chest pain, no abdominal pain, no bowel incontinence, no perianal numbness, no bladder incontinence, no dysuria and no weakness. She has tried nothing for the symptoms.    Past Medical History:  Diagnosis Date  . Chronic pain syndrome   . Gastric ulcer   . Generalized anxiety disorder   . Hepatitis C   . Hyperlipidemia   . Hypertension   . Kidney stones   . Liver failure (HCC)   . Peripheral neuropathy   . Tachycardia     Patient Active Problem List   Diagnosis Date Noted  . Hepatitis C 12/12/2012  . 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 KNEE PAIN, LEFT 09/29/2007    Past Surgical History:  Procedure Laterality Date  . FOOT SURGERY    . rt foot surgery    . TUBAL LIGATION      OB History    Gravida Para Term Preterm AB Living   2 2 2     2    SAB TAB Ectopic Multiple Live Births                   Home Medications    Prior to Admission medications   Medication Sig Start Date End Date Taking? Authorizing Provider  HYDROcodone-acetaminophen (NORCO/VICODIN) 5-325 MG tablet Take 1 tablet by mouth every 6 (six) hours as needed for moderate pain. 07/08/16   Vickki HearingHarrison, Stanley E, MD    Family History Family History  Problem Relation Age of Onset  . Heart  disease Mother   . Diabetes Mother   . Peripheral Artery Disease Father     Social History Social History  Substance Use Topics  . Smoking status: Current Some Day Smoker    Packs/day: 0.50    Years: 24.00    Types: Cigarettes  . Smokeless tobacco: Never Used  . Alcohol use No     Allergies   Aspirin; Ibuprofen; Tramadol; and Ancef [cefazolin]   Review of Systems Review of Systems  Constitutional: Negative for activity change.       All ROS Neg except as noted in HPI  HENT: Negative for nosebleeds.   Eyes: Negative for photophobia and discharge.  Respiratory: Negative for cough, shortness of breath and wheezing.   Cardiovascular: Negative for chest pain and palpitations.  Gastrointestinal: Negative for abdominal pain, blood in stool and bowel incontinence.  Genitourinary: Negative for bladder incontinence, dysuria, frequency and hematuria.  Musculoskeletal: Positive for back pain. Negative for arthralgias and neck pain.       Leg pain  Skin: Negative.   Neurological: Negative for dizziness, seizures, speech difficulty and weakness.  Psychiatric/Behavioral: Negative for confusion and hallucinations.     Physical Exam Updated Vital Signs BP 129/73 (BP Location:  Left Arm)   Pulse 91   Temp 98.2 F (36.8 C) (Oral)   Resp 18   Ht 5' (1.524 m)   Wt 77.1 kg (170 lb)   LMP 01/19/2017   SpO2 97%   BMI 33.20 kg/m   Physical Exam  Constitutional: She is oriented to person, place, and time. She appears well-developed and well-nourished.  Non-toxic appearance.  HENT:  Head: Normocephalic.  Right Ear: Tympanic membrane and external ear normal.  Left Ear: Tympanic membrane and external ear normal.  Eyes: Pupils are equal, round, and reactive to light. EOM and lids are normal.  Neck: Normal range of motion. Neck supple. Carotid bruit is not present.  Cardiovascular: Normal rate, regular rhythm, normal heart sounds, intact distal pulses and normal pulses.     Pulmonary/Chest: Breath sounds normal. No respiratory distress.  Abdominal: Soft. Bowel sounds are normal. There is no tenderness. There is no guarding.  Musculoskeletal: Normal range of motion.  Chaperone present during the examination.  There is mild to moderate pain to palpation of the lower lumbar area. There is some spinal tenderness and spasm of the lower lumbar on the left.  There is tightness and tenseness of the posterior left thigh. There is no mass appreciated behind the knee. There is no effusion of the left knee.  Lymphadenopathy:       Head (right side): No submandibular adenopathy present.       Head (left side): No submandibular adenopathy present.    She has no cervical adenopathy.  Neurological: She is alert and oriented to person, place, and time. She has normal strength. No cranial nerve deficit or sensory deficit.  Skin: Skin is warm and dry.  Psychiatric: She has a normal mood and affect. Her speech is normal.  Nursing note and vitals reviewed.    ED Treatments / Results  Labs (all labs ordered are listed, but only abnormal results are displayed) Labs Reviewed  POC URINE PREG, ED    EKG  EKG Interpretation None       Radiology Dg Lumbar Spine Complete  Result Date: 02/27/2017 CLINICAL DATA:  41 year old female with lumbar back pain radiating to the left hip and thigh following heavy lifting today. EXAM: LUMBAR SPINE - COMPLETE 4+ VIEW COMPARISON:  CT Abdomen and Pelvis 08/28/2015. Lumbar radiographs 02/15/2013. FINDINGS: Normal lumbar segmentation. Bone mineralization is within normal limits. Chronic straightening and mild reversal of lumbar lordosis. Stable lumbar vertebral height and alignment since 2014. Chronic mid and lower lumbar endplate spurring but relatively preserved disc spaces. No pars fracture. Chronic lower thoracic endplate spurring. Visible lower thoracic levels appear intact. Sacral ala and SI joints appear normal. Negative visible bowel  gas pattern. IMPRESSION: No acute osseous abnormality in lumbar spine. Electronically Signed   By: Odessa Fleming M.D.   On: 02/27/2017 13:56    Procedures Procedures (including critical care time)  Medications Ordered in ED Medications - No data to display   Initial Impression / Assessment and Plan / ED Course  I have reviewed the triage vital signs and the nursing notes.  Pertinent labs & imaging results that were available during my care of the patient were reviewed by me and considered in my medical decision making (see chart for details).       Final Clinical Impressions(s) / ED Diagnoses MDM Vital signs within normal limits. X-ray of the lumbar spine shows no acute osseous abnormality. There is noted chronic straightening and mild reversal of the lumbar lordosis present.  Suspect  that the patient has a muscle strain. The patient is allergic to aspirin as well as nonsteroidal anti-inflammatory medications. The patient cannot take Ultram. Prescription for Robaxin and 12 tablets of Norco given to the patient. I've asked patient to use a heating pad, and/or warm tub soaks. The patient is to follow-up with her primary physician if any changes or problems.    Final diagnoses:  Muscle strain    New Prescriptions New Prescriptions   No medications on file     Ivery QualeBryant, Dorothey Oetken, Cordelia Poche-C 02/27/17 1422    Long, Arlyss RepressJoshua G, MD 02/28/17 2007    Ivery QualeBryant, [place], PA-C 03/12/17 1140    Long, Arlyss RepressJoshua G, MD 03/12/17 [phone]

## 2017-02-27 NOTE — ED Triage Notes (Signed)
Fell when moving containers today and c/o lower back pain that radiates down left leg.

## 2017-03-24 ENCOUNTER — Emergency Department (HOSPITAL_COMMUNITY)
Admission: EM | Admit: 2017-03-24 | Discharge: 2017-03-24 | Disposition: A | Discharge status: Home / Self Care | Payer: Self-pay | Attending: Emergency Medicine | Admitting: Emergency Medicine

## 2017-03-24 ENCOUNTER — Encounter (HOSPITAL_COMMUNITY): Payer: Self-pay

## 2017-03-24 ENCOUNTER — Emergency Department (HOSPITAL_COMMUNITY): Payer: Self-pay

## 2017-03-24 DIAGNOSIS — W231XXA Caught, crushed, jammed, or pinched between stationary objects, initial encounter: Secondary | ICD-10-CM | POA: Insufficient documentation

## 2017-03-24 DIAGNOSIS — Y939 Activity, unspecified: Secondary | ICD-10-CM | POA: Insufficient documentation

## 2017-03-24 DIAGNOSIS — Z79899 Other long term (current) drug therapy: Secondary | ICD-10-CM | POA: Insufficient documentation

## 2017-03-24 DIAGNOSIS — Y998 Other external cause status: Secondary | ICD-10-CM | POA: Insufficient documentation

## 2017-03-24 DIAGNOSIS — F17210 Nicotine dependence, cigarettes, uncomplicated: Secondary | ICD-10-CM | POA: Insufficient documentation

## 2017-03-24 DIAGNOSIS — Y929 Unspecified place or not applicable: Secondary | ICD-10-CM | POA: Insufficient documentation

## 2017-03-24 DIAGNOSIS — S63653A Sprain of metacarpophalangeal joint of left middle finger, initial encounter: Secondary | ICD-10-CM | POA: Insufficient documentation

## 2017-03-24 DIAGNOSIS — I10 Essential (primary) hypertension: Secondary | ICD-10-CM | POA: Insufficient documentation

## 2017-03-24 NOTE — ED Triage Notes (Signed)
Pt reports she was trying to stop the lift gate from closing on her suv and it hyperextended her left middle finger.  Reports felt like knuckle was dislocated but went back in place.  Area swollen.

## 2017-03-24 NOTE — ED Provider Notes (Signed)
[facility] Provider Note   CSN: [id number] Arrival date & time: 03/24/17  0944     History   Chief Complaint Chief Complaint  Patient presents with  . Hand Pain    HPI Mary Jarvis is a 41 y.o. female.  The history is provided by the patient. No language interpreter was used.  Hand Pain  This is a new problem. The current episode started 1 to 2 hours ago. The problem occurs constantly. The problem has been gradually worsening. Nothing aggravates the symptoms. Nothing relieves the symptoms. She has tried nothing for the symptoms. The treatment provided no relief.   Pt crushed finger in a lift gate on the back of a [employer]iece Past Medical History:  Diagnosis Date  . Chronic pain syndrome   . Gastric ulcer   . Generalized anxiety disorder   . Hepatitis C   . Hyperlipidemia   . Hypertension   . Kidney stones   . Liver failure (HCC)   . Peripheral neuropathy   . Tachycardia     Patient Active Problem List   Diagnosis Date Noted  . Hepatitis C 12/12/2012  . 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 KNEE PAIN, LEFT 09/29/2007    Past Surgical History:  Procedure Laterality Date  . FOOT SURGERY    . rt foot surgery    . TUBAL LIGATION      OB History    Gravida Para Term Preterm AB Living   2 2 2     2    SAB TAB Ectopic Multiple Live Births                   Home Medications    Prior to Admission medications   Medication Sig Start Date End Date Taking? Authorizing Provider  HYDROcodone-acetaminophen (NORCO/VICODIN) 5-325 MG tablet Take 1 tablet by mouth every 4 (four) hours as needed. 02/27/17   Ivery Quale, PA-C  methocarbamol (ROBAXIN) 500 MG tablet Take 1 tablet (500 mg total) by mouth 3 (three) times daily. 02/27/17   Ivery Quale, PA-C    Family History Family History  Problem Relation Age of Onset  . Heart disease Mother   . Diabetes Mother   . Peripheral Artery Disease Father     Social History Social History  Substance Use  Topics  . Smoking status: Current Some Day Smoker    Packs/day: 0.50    Years: 24.00    Types: Cigarettes  . Smokeless tobacco: Never Used  . Alcohol use No     Allergies   Aspirin; Ibuprofen; Tramadol; and Ancef [cefazolin]   Review of Systems Review of Systems  All other systems reviewed and are negative.    Physical Exam Updated Vital Signs BP (!) 146/97 (BP Location: Right Arm)   Pulse 80   Temp 98.3 F (36.8 C) (Oral)   Resp 20   Ht 5' (1.524 m)   Wt 77.1 kg (170 lb)   LMP 03/15/2017   SpO2 97%   BMI 33.20 kg/m   Physical Exam  Constitutional: She appears well-developed and well-nourished.  HENT:  Head: Normocephalic.  Musculoskeletal: She exhibits tenderness.  Tender left 3rd finger at mcp joint  Ns and nv intact  Neurological: She is alert.  Skin: Skin is warm.  Psychiatric: She has a normal mood and affect.  Nursing note and vitals reviewed.    ED Treatments / Results  Labs (all labs ordered are listed, but only abnormal results are displayed) Labs Reviewed - No data to  display  EKG  EKG Interpretation None       Radiology Dg Hand Complete Left  Result Date: 03/24/2017 CLINICAL DATA:  Hyperextension injury to left middle finger. EXAM: LEFT HAND - COMPLETE 3+ VIEW COMPARISON:  None. FINDINGS: There is no evidence of fracture or dislocation. There is no evidence of arthropathy or other focal bone abnormality. Soft tissues are unremarkable. IMPRESSION: Negative. Electronically Signed   By: Charlett Nose M.D.   On: 03/24/2017 10:15    Procedures Procedures (including critical care time)  Medications Ordered in ED Medications - No data to display   Initial Impression / Assessment and Plan / ED Course  I have reviewed the triage vital signs and the nursing notes.  Pertinent labs & imaging results that were available during my care of the patient were reviewed by me and considered in my medical decision making (see chart for details).        Final Clinical Impressions(s) / ED Diagnoses   Final diagnoses:  Sprain of metacarpophalangeal (MCP) joint of left middle finger, initial encounter    New Prescriptions New Prescriptions   No medications on file  An After Visit Summary was printed and given to the patient.   Elson Areas, [name] 03/24/17 1601    Mancel Bale, MD 03/25/17 [phone]

## 2017-03-24 NOTE — ED Notes (Signed)
Patient transported to X-ray 

## 2017-03-24 NOTE — ED Notes (Signed)
Patient returned from xray.

## 2017-04-06 ENCOUNTER — Emergency Department (HOSPITAL_COMMUNITY): Payer: Self-pay

## 2017-04-06 ENCOUNTER — Emergency Department (HOSPITAL_COMMUNITY)
Admission: EM | Admit: 2017-04-06 | Discharge: 2017-04-07 | Disposition: A | Discharge status: Home / Self Care | Payer: Self-pay | Attending: Emergency Medicine | Admitting: Emergency Medicine

## 2017-04-06 ENCOUNTER — Encounter (HOSPITAL_COMMUNITY): Payer: Self-pay | Admitting: Emergency Medicine

## 2017-04-06 DIAGNOSIS — R0789 Other chest pain: Secondary | ICD-10-CM | POA: Insufficient documentation

## 2017-04-06 DIAGNOSIS — Y9389 Activity, other specified: Secondary | ICD-10-CM | POA: Insufficient documentation

## 2017-04-06 DIAGNOSIS — T07XXXA Unspecified multiple injuries, initial encounter: Secondary | ICD-10-CM

## 2017-04-06 DIAGNOSIS — Z041 Encounter for examination and observation following transport accident: Secondary | ICD-10-CM | POA: Insufficient documentation

## 2017-04-06 DIAGNOSIS — T148XXA Other injury of unspecified body region, initial encounter: Secondary | ICD-10-CM | POA: Insufficient documentation

## 2017-04-06 DIAGNOSIS — M25512 Pain in left shoulder: Secondary | ICD-10-CM | POA: Insufficient documentation

## 2017-04-06 DIAGNOSIS — F17210 Nicotine dependence, cigarettes, uncomplicated: Secondary | ICD-10-CM | POA: Insufficient documentation

## 2017-04-06 DIAGNOSIS — Y999 Unspecified external cause status: Secondary | ICD-10-CM | POA: Insufficient documentation

## 2017-04-06 DIAGNOSIS — Y92410 Unspecified street and highway as the place of occurrence of the external cause: Secondary | ICD-10-CM | POA: Insufficient documentation

## 2017-04-06 LAB — CBC WITH DIFFERENTIAL/PLATELET
Basophils Absolute: 0 10*3/uL (ref 0.0–0.1)
Basophils Relative: 0 %
Eosinophils Absolute: 0.1 10*3/uL (ref 0.0–0.7)
Eosinophils Relative: 2 %
HCT: 36.6 % (ref 36.0–46.0)
Hemoglobin: 11.1 g/dL — ABNORMAL LOW (ref 12.0–15.0)
Lymphocytes Relative: 34 %
Lymphs Abs: 2.2 10*3/uL (ref 0.7–4.0)
MCH: 26.4 pg (ref 26.0–34.0)
MCHC: 30.3 g/dL (ref 30.0–36.0)
MCV: 86.9 fL (ref 78.0–100.0)
Monocytes Absolute: 0.4 10*3/uL (ref 0.1–1.0)
Monocytes Relative: 6 %
Neutro Abs: 3.9 10*3/uL (ref 1.7–7.7)
Neutrophils Relative %: 58 %
Platelets: 216 10*3/uL (ref 150–400)
RBC: 4.21 MIL/uL (ref 3.87–5.11)
RDW: 16.2 % — ABNORMAL HIGH (ref 11.5–15.5)
WBC: 6.6 10*3/uL (ref 4.0–10.5)

## 2017-04-06 LAB — I-STAT CHEM 8, ED
BUN: 10 mg/dL (ref 6–20)
CREATININE: 0.8 mg/dL (ref 0.44–1.00)
Calcium, Ion: 0.99 mmol/L — ABNORMAL LOW (ref 1.15–1.40)
Chloride: 103 mmol/L (ref 101–111)
GLUCOSE: 144 mg/dL — AB (ref 65–99)
HEMATOCRIT: 37 % (ref 36.0–46.0)
HEMOGLOBIN: 12.6 g/dL (ref 12.0–15.0)
Potassium: 3.5 mmol/L (ref 3.5–5.1)
Sodium: 140 mmol/L (ref 135–145)
TCO2: 24 mmol/L (ref 22–32)

## 2017-04-06 LAB — BASIC METABOLIC PANEL
Anion gap: 10 (ref 5–15)
BUN: 9 mg/dL (ref 6–20)
CO2: 24 mmol/L (ref 22–32)
Calcium: 8.7 mg/dL — ABNORMAL LOW (ref 8.9–10.3)
Chloride: 104 mmol/L (ref 101–111)
Creatinine, Ser: 0.87 mg/dL (ref 0.44–1.00)
GFR calc Af Amer: >60 mL/min (ref >60)
GFR calc non Af Amer: >60 mL/min (ref >60)
Glucose, Bld: 145 mg/dL — ABNORMAL HIGH (ref 65–99)
Potassium: 3.6 mmol/L (ref 3.5–5.1)
Sodium: 138 mmol/L (ref 135–145)

## 2017-04-06 LAB — TYPE AND SCREEN
ABO/RH(D): A NEG
Antibody Screen: NEGATIVE

## 2017-04-06 LAB — I-STAT CG4 LACTIC ACID, ED: Lactic Acid, Venous: 2.05 mmol/L (ref 0.5–1.9)

## 2017-04-06 MED ORDER — FENTANYL CITRATE (PF) 100 MCG/2ML IJ SOLN
50.0000 ug | Freq: Once | INTRAMUSCULAR | Status: DC
  Filled 2017-04-06: qty 2
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
## 2017-04-06 NOTE — ED Notes (Signed)
Rn attempted x2 to start IV without success.
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
## 2017-04-07 ENCOUNTER — Emergency Department (HOSPITAL_COMMUNITY): Payer: Self-pay

## 2017-04-07 ENCOUNTER — Encounter (HOSPITAL_COMMUNITY): Payer: Self-pay

## 2017-04-07 ENCOUNTER — Encounter (HOSPITAL_COMMUNITY): Payer: Self-pay | Admitting: Emergency Medicine

## 2017-04-07 LAB — I-STAT BETA HCG BLOOD, ED (MC, WL, AP ONLY): I-stat hCG, quantitative: <5 m[IU]/mL (ref <5)

## 2017-04-07 LAB — ABO/RH: ABO/RH(D): A NEG

## 2017-04-07 MED ORDER — IOPAMIDOL (ISOVUE-300) INJECTION 61%
INTRAVENOUS | Status: AC
  Administered 2017-04-07: 100 mL
  Filled 2017-04-07: qty 100

## 2017-04-07 MED ORDER — CYCLOBENZAPRINE HCL 10 MG PO TABS: 10.0000 mg | ORAL_TABLET | Freq: Two times a day (BID) | ORAL | 0 refills | Status: DC | PRN

## 2017-04-07 MED ORDER — MORPHINE SULFATE (PF) 4 MG/ML IV SOLN: 6.0000 mg | Freq: Once | INTRAVENOUS | Status: DC

## 2017-04-07 MED ORDER — MELOXICAM 15 MG PO TABS: 15.0000 mg | ORAL_TABLET | Freq: Every day | ORAL | 0 refills | Status: DC | PRN

## 2017-04-07 NOTE — ED Notes (Signed)
Pt in CT.
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
## 2017-05-21 ENCOUNTER — Emergency Department (HOSPITAL_COMMUNITY)
Admission: EM | Admit: 2017-05-21 | Discharge: 2017-05-21 | Disposition: A | Discharge status: Home / Self Care | Payer: Self-pay | Attending: Emergency Medicine | Admitting: Emergency Medicine

## 2017-05-21 ENCOUNTER — Encounter (HOSPITAL_COMMUNITY): Payer: Self-pay

## 2017-05-21 DIAGNOSIS — K0889 Other specified disorders of teeth and supporting structures: Secondary | ICD-10-CM

## 2017-05-21 DIAGNOSIS — F17210 Nicotine dependence, cigarettes, uncomplicated: Secondary | ICD-10-CM | POA: Insufficient documentation

## 2017-05-21 DIAGNOSIS — K029 Dental caries, unspecified: Secondary | ICD-10-CM | POA: Insufficient documentation

## 2017-05-21 DIAGNOSIS — I10 Essential (primary) hypertension: Secondary | ICD-10-CM | POA: Insufficient documentation

## 2017-05-21 MED ORDER — ACETAMINOPHEN 500 MG PO TABS
1000.0000 mg | ORAL_TABLET | Freq: Once | ORAL | Status: AC
  Administered 2017-05-21: 1000 mg via ORAL
  Filled 2017-05-21: qty 2

## 2017-05-21 MED ORDER — AMOXICILLIN 500 MG PO CAPS: 500.0000 mg | ORAL_CAPSULE | Freq: Three times a day (TID) | ORAL | 0 refills | Status: DC

## 2017-05-21 MED ORDER — CLINDAMYCIN HCL 150 MG PO CAPS
300.0000 mg | ORAL_CAPSULE | Freq: Once | ORAL | Status: AC
  Administered 2017-05-21: 300 mg via ORAL
  Filled 2017-05-21: qty 2

## 2017-05-21 NOTE — Discharge Instructions (Signed)
Your vital signs within normal limits.  There is no evidence of emergent oral or dental infection at this time.  Please use Amoxil 3 times daily with food.  Use Tylenol extra strength for pain or discomfort.  Please see your dentist as soon as possible for additional evaluation and management.

## 2017-05-21 NOTE — ED Triage Notes (Signed)
Right upper tooth ache x2 days with chills.

## 2017-05-21 NOTE — ED Provider Notes (Signed)
[facility] EMERGENCY DEPARTMENT Provider Note   CSN: [id number] Arrival date & time: 05/21/17  1401     History   Chief Complaint Chief Complaint  Patient presents with  . Dental Pain    HPI Mary Jarvis is a 41 y.o. female.  The history is provided by the patient.  Dental Pain   This is a recurrent problem. The current episode started more than 2 days ago. The problem occurs daily. The problem has been gradually worsening. The pain is moderate. She has tried nothing for the symptoms.    Past Medical History:  Diagnosis Date  . Chronic pain syndrome   . Gastric ulcer   . Generalized anxiety disorder   . Hepatitis C   . Hyperlipidemia   . Hypertension   . Kidney stones   . Liver failure (HCC)   . Peripheral neuropathy   . Tachycardia     Patient Active Problem List   Diagnosis Date Noted  . Hepatitis C 12/12/2012  . 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 KNEE PAIN, LEFT 09/29/2007    Past Surgical History:  Procedure Laterality Date  . FOOT SURGERY    . rt foot surgery    . TUBAL LIGATION      OB History    Gravida Para Term Preterm AB Living   2 2 2  0 0     SAB TAB Ectopic Multiple Live Births   0 0 0           Home Medications    Prior to Admission medications   Medication Sig Start Date End Date Taking? Authorizing Provider  cyclobenzaprine (FLEXERIL) 10 MG tablet Take 1 tablet (10 mg total) by mouth 2 (two) times daily as needed for muscle spasms. 04/07/17   Raeford RazorKohut, Stephen, MD  HYDROcodone-acetaminophen (NORCO/VICODIN) 5-325 MG tablet Take 1 tablet by mouth every 4 (four) hours as needed. 02/27/17   Ivery QualeBryant, Deshundra Waller, PA-C  meloxicam (MOBIC) 15 MG tablet Take 1 tablet (15 mg total) by mouth daily as needed for pain. 04/07/17   Raeford RazorKohut, Stephen, MD  methocarbamol (ROBAXIN) 500 MG tablet Take 1 tablet (500 mg total) by mouth 3 (three) times daily. 02/27/17   Ivery QualeBryant, Fidelis Loth, PA-C    Family History Family History  Problem Relation Age of  Onset  . Heart disease Mother   . Diabetes Mother   . Peripheral Artery Disease Father     Social History Social History  Substance Use Topics  . Smoking status: Current Every Day Smoker    Packs/day: 0.50    Types: Cigarettes  . Smokeless tobacco: Never Used  . Alcohol use Yes     Allergies   Aspirin; Ibuprofen; Tramadol; and Ancef [cefazolin]   Review of Systems Review of Systems  Constitutional: Negative for activity change.       All ROS Neg except as noted in HPI  HENT: Positive for congestion and dental problem. Negative for nosebleeds.   Eyes: Negative for photophobia and discharge.  Respiratory: Negative for cough, shortness of breath and wheezing.   Cardiovascular: Negative for chest pain and palpitations.  Gastrointestinal: Negative for abdominal pain and blood in stool.  Genitourinary: Negative for dysuria, frequency and hematuria.  Musculoskeletal: Negative for arthralgias, back pain and neck pain.  Skin: Negative.   Neurological: Negative for dizziness, seizures and speech difficulty.  Psychiatric/Behavioral: Negative for confusion and hallucinations.     Physical Exam Updated Vital Signs BP 120/67 (BP Location: Left Arm)   Pulse 88   Temp  97.8 F (36.6 C) (Oral)   Resp 20   Ht 5\' 1"  (1.549 m)   Wt 77.1 kg (170 lb)   LMP 04/07/2017   SpO2 96%   BMI 32.12 kg/m   Physical Exam  Constitutional: She is oriented to person, place, and time. She appears well-developed and well-nourished.  Non-toxic appearance.  HENT:  Head: Normocephalic.  Right Ear: Tympanic membrane and external ear normal.  Left Ear: Tympanic membrane and external ear normal.  Multiple dental caries noted.  The right upper premolar and first molar deep cavities.  The premolar is decayed to the gumline.  There is swelling in the premolar and molar area of the gum.  The airway is patent.  The uvula is in the midline.  There is no swelling under the tongue.  Eyes: Pupils are equal,  round, and reactive to light. EOM and lids are normal.  Neck: Normal range of motion. Neck supple. Carotid bruit is not present.  Cardiovascular: Normal rate, regular rhythm, normal heart sounds, intact distal pulses and normal pulses.   Pulmonary/Chest: Breath sounds normal. No respiratory distress.  Abdominal: Soft. Bowel sounds are normal. There is no tenderness. There is no guarding.  Musculoskeletal: Normal range of motion.  Lymphadenopathy:       Head (right side): No submandibular adenopathy present.       Head (left side): No submandibular adenopathy present.    She has no cervical adenopathy.  Neurological: She is alert and oriented to person, place, and time. She has normal strength. No cranial nerve deficit or sensory deficit.  Skin: Skin is warm and dry.  Psychiatric: She has a normal mood and affect. Her speech is normal.  Nursing note and vitals reviewed.    ED Treatments / Results  Labs (all labs ordered are listed, but only abnormal results are displayed) Labs Reviewed - No data to display  EKG  EKG Interpretation None       Radiology No results found.  Procedures Procedures (including critical care time)  Medications Ordered in ED Medications - No data to display   Initial Impression / Assessment and Plan / ED Course  I have reviewed the triage vital signs and the nursing notes.  Pertinent labs & imaging results that were available during my care of the patient were reviewed by me and considered in my medical decision making (see chart for details).       Final Clinical Impressions(s) / ED Diagnoses MDM Vital signs within normal limits.  Multiple dental caries noted.  There is no evidence forLudwig's angina.  Patient strongly advised to see the dentist as soon as possible.  Prescription for Amoxil given to the patient.  Patient will use Tylenol extra strength for her discomfort.   Final diagnoses:  Dental caries  Pain, dental    New  Prescriptions New Prescriptions   AMOXICILLIN (AMOXIL) 500 MG CAPSULE    Take 1 capsule (500 mg total) by mouth 3 (three) times daily.     Ivery Quale, PA-C 05/21/17 1451    Samuel Jester, DO 05/25/17 [phone]

## 2017-06-09 ENCOUNTER — Ambulatory Visit: Payer: Self-pay | Admitting: Family Medicine

## 2018-02-14 ENCOUNTER — Encounter (HOSPITAL_COMMUNITY): Payer: Self-pay | Admitting: Emergency Medicine

## 2018-02-14 ENCOUNTER — Emergency Department (HOSPITAL_COMMUNITY)
Admission: EM | Admit: 2018-02-14 | Discharge: 2018-02-14 | Disposition: A | Discharge status: Home / Self Care | Payer: Self-pay | Attending: Emergency Medicine | Admitting: Emergency Medicine

## 2018-02-14 DIAGNOSIS — K0889 Other specified disorders of teeth and supporting structures: Secondary | ICD-10-CM | POA: Insufficient documentation

## 2018-02-14 DIAGNOSIS — F17210 Nicotine dependence, cigarettes, uncomplicated: Secondary | ICD-10-CM | POA: Insufficient documentation

## 2018-02-14 DIAGNOSIS — Z79899 Other long term (current) drug therapy: Secondary | ICD-10-CM | POA: Insufficient documentation

## 2018-02-14 DIAGNOSIS — I10 Essential (primary) hypertension: Secondary | ICD-10-CM | POA: Insufficient documentation

## 2018-02-14 MED ORDER — CLINDAMYCIN HCL 150 MG PO CAPS: 300.0000 mg | ORAL_CAPSULE | Freq: Four times a day (QID) | ORAL | 0 refills | Status: DC

## 2018-02-14 MED ORDER — ACETAMINOPHEN-CODEINE #3 300-30 MG PO TABS: 1.0000 | ORAL_TABLET | ORAL | 0 refills | Status: DC | PRN

## 2018-02-14 NOTE — ED Provider Notes (Signed)
[facility] EMERGENCY DEPARTMENT Provider Note   CSN: [id number] Arrival date & time: 02/14/18  0846     History   Chief Complaint Chief Complaint  Patient presents with  . Dental Pain    HPI Mary Jarvis is a 42 y.o. female.  HPI   Mary Jarvis is a 42 y.o. female who presents to the Emergency Department complaining of left upper dental pain for 1 week.  She describes a constant sharp pain to all of her upper left teeth.  She reports history of multiple dental caries and does not currently have a Education officer, community or insurance.  She has been taking over-the-counter Tylenol, applying Orajel and Anbesol without relief.  This morning, she states she noticed swelling to her left cheek area and several white "bumps" along her gums.  She denies fever, chills, neck pain, sore throat, difficulty swallowing or breathing.   Past Medical History:  Diagnosis Date  . Chronic pain syndrome   . Gastric ulcer   . Generalized anxiety disorder   . Hepatitis C   . Hyperlipidemia   . Hypertension   . Kidney stones   . Liver failure (HCC)   . Peripheral neuropathy   . Tachycardia     Patient Active Problem List   Diagnosis Date Noted  . Hepatitis C 12/12/2012  . 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 KNEE PAIN, LEFT 09/29/2007    Past Surgical History:  Procedure Laterality Date  . FOOT SURGERY    . rt foot surgery    . TUBAL LIGATION       OB History    Gravida  2   Para  2   Term  2   Preterm  0   AB  0   Living        SAB  0   TAB  0   Ectopic  0   Multiple      Live Births               Home Medications    Prior to Admission medications   Medication Sig Start Date End Date Taking? Authorizing Provider  amoxicillin (AMOXIL) 500 MG capsule Take 1 capsule (500 mg total) by mouth 3 (three) times daily. 05/21/17   Ivery Quale, PA-C  cyclobenzaprine (FLEXERIL) 10 MG tablet Take 1 tablet (10 mg total) by mouth 2 (two) times daily as needed for  muscle spasms. 04/07/17   Raeford Razor, MD  HYDROcodone-acetaminophen (NORCO/VICODIN) 5-325 MG tablet Take 1 tablet by mouth every 4 (four) hours as needed. 02/27/17   Ivery Quale, PA-C  meloxicam (MOBIC) 15 MG tablet Take 1 tablet (15 mg total) by mouth daily as needed for pain. 04/07/17   Raeford Razor, MD  methocarbamol (ROBAXIN) 500 MG tablet Take 1 tablet (500 mg total) by mouth 3 (three) times daily. 02/27/17   Ivery Quale, PA-C    Family History Family History  Problem Relation Age of Onset  . Heart disease Mother   . Diabetes Mother   . Peripheral Artery Disease Father     Social History Social History   Tobacco Use  . Smoking status: Current Every Day Smoker    Packs/day: 1.00    Types: Cigarettes  . Smokeless tobacco: Never Used  Substance Use Topics  . Alcohol use: Not Currently  . 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  HENT: Positive for dental problem. Negative for congestion, facial swelling, sore throat and trouble swallowing.   Eyes: Negative for pain and visual disturbance.  Musculoskeletal: Negative for neck pain and neck stiffness.  Neurological: Negative for dizziness, facial asymmetry and headaches.  Hematological: Negative for adenopathy.  All other systems reviewed and are negative.    Physical Exam Updated Vital Signs BP (!) 144/96 (BP Location: Left Arm)   Pulse 94   Temp 97.6 F (36.4 C) (Oral)   Resp 18   Ht 5\' 2"  (1.575 m)   Wt 92.1 kg (203 lb)   LMP 02/11/2018 (Exact Date)   SpO2 97%   BMI 37.13 kg/m   Physical Exam  Constitutional: She is oriented to person, place, and time. She appears well-developed and well-nourished. No distress.  HENT:  Head: Normocephalic and atraumatic.  Right Ear: Tympanic membrane and ear canal normal.  Left Ear:  Tympanic membrane and ear canal normal.  Mouth/Throat: Uvula is midline, oropharynx is clear and moist and mucous membranes are normal. No trismus in the jaw. Dental caries present. No dental abscesses or uvula swelling.  ttp and dental caries of the left upper lateral incisor, premolars, and molars.  Widespread dental decay.  Erythema and mild edema of the surrounding gingiva.  No facial swelling, obvious dental abscess, trismus, or sublingual abnml.    Neck: Normal range of motion. Neck supple.  Cardiovascular: Normal rate, regular rhythm and normal heart sounds.  No murmur heard. Pulmonary/Chest: Effort normal and breath sounds normal.  Musculoskeletal: Normal range of motion.  Lymphadenopathy:    She has no cervical adenopathy.  Neurological: She is alert and oriented to person, place, and time. She exhibits normal muscle tone. Coordination normal.  Skin: Skin is warm and dry.  Nursing note and vitals reviewed.    ED Treatments / Results  Labs (all labs ordered are listed, but only abnormal results are displayed) Labs Reviewed - No data to display  EKG None  Radiology No results found.  Procedures Procedures (including critical care time)  Medications Ordered in ED Medications - No data to display   Initial Impression / Assessment and Plan / ED Course  I have reviewed the triage vital signs and the nursing notes.  Pertinent labs & imaging results that were available during my care of the patient were reviewed by me and considered in my medical decision making (see chart for details).     Patient well-appearing.  Vitals reviewed.  Airway is patent.  No dental abscess at present, no appreciable facial swelling.  No concerning symptoms for Ludwig's angina.  Referral information given for local dentistry  Final Clinical Impressions(s) / ED Diagnoses   Final diagnoses:  Pain, dental    ED Discharge Orders    None       Pauline Ausriplett, Aleana Fifita, PA-C 02/15/18 1501      Pricilla LovelessGoldston, Scott, MD 02/15/18 1550

## 2018-02-14 NOTE — ED Triage Notes (Signed)
Pt c/o LT upper dental pain x 1 week. Pt does not have a dentist. Pt taking Tylenol with no relief.

## 2018-02-14 NOTE — Discharge Instructions (Addendum)
Contact 1 of the dentist on the list provided to establish further care.

## 2018-05-05 ENCOUNTER — Emergency Department (HOSPITAL_COMMUNITY)
Admission: EM | Admit: 2018-05-05 | Discharge: 2018-05-05 | Disposition: A | Discharge status: Home / Self Care | Payer: Self-pay | Attending: Emergency Medicine | Admitting: Emergency Medicine

## 2018-05-05 ENCOUNTER — Emergency Department (HOSPITAL_COMMUNITY): Payer: Self-pay

## 2018-05-05 ENCOUNTER — Other Ambulatory Visit: Payer: Self-pay

## 2018-05-05 ENCOUNTER — Encounter (HOSPITAL_COMMUNITY): Payer: Self-pay | Admitting: Emergency Medicine

## 2018-05-05 DIAGNOSIS — R0602 Shortness of breath: Secondary | ICD-10-CM

## 2018-05-05 DIAGNOSIS — I10 Essential (primary) hypertension: Secondary | ICD-10-CM | POA: Insufficient documentation

## 2018-05-05 DIAGNOSIS — F17210 Nicotine dependence, cigarettes, uncomplicated: Secondary | ICD-10-CM | POA: Insufficient documentation

## 2018-05-05 DIAGNOSIS — J40 Bronchitis, not specified as acute or chronic: Secondary | ICD-10-CM | POA: Insufficient documentation

## 2018-05-05 DIAGNOSIS — Z79899 Other long term (current) drug therapy: Secondary | ICD-10-CM | POA: Insufficient documentation

## 2018-05-05 LAB — CBC WITH DIFFERENTIAL/PLATELET
ABS IMMATURE GRANULOCYTES: 0.02 10*3/uL (ref 0.00–0.07)
Basophils Absolute: 0 10*3/uL (ref 0.0–0.1)
Basophils Relative: 0 %
EOS PCT: 2 %
Eosinophils Absolute: 0.1 10*3/uL (ref 0.0–0.5)
HEMATOCRIT: 37.4 % (ref 36.0–46.0)
Hemoglobin: 11.8 g/dL — ABNORMAL LOW (ref 12.0–15.0)
Immature Granulocytes: 0 %
LYMPHS ABS: 1.7 10*3/uL (ref 0.7–4.0)
Lymphocytes Relative: 23 %
MCH: 28.1 pg (ref 26.0–34.0)
MCHC: 31.6 g/dL (ref 30.0–36.0)
MCV: 89 fL (ref 80.0–100.0)
MONOS PCT: 5 %
Monocytes Absolute: 0.4 10*3/uL (ref 0.1–1.0)
NEUTROS ABS: 5.2 10*3/uL (ref 1.7–7.7)
Neutrophils Relative %: 70 %
Platelets: 232 10*3/uL (ref 150–400)
RBC: 4.2 MIL/uL (ref 3.87–5.11)
RDW: 14.3 % (ref 11.5–15.5)
WBC: 7.5 10*3/uL (ref 4.0–10.5)
nRBC: 0 % (ref 0.0–0.2)

## 2018-05-05 LAB — COMPREHENSIVE METABOLIC PANEL
ALK PHOS: 51 U/L (ref 38–126)
ALT: 20 U/L (ref 0–44)
ANION GAP: 9 (ref 5–15)
AST: 18 U/L (ref 15–41)
Albumin: 3.6 g/dL (ref 3.5–5.0)
BILIRUBIN TOTAL: 0.8 mg/dL (ref 0.3–1.2)
BUN: 6 mg/dL (ref 6–20)
CALCIUM: 8.8 mg/dL — AB (ref 8.9–10.3)
CO2: 25 mmol/L (ref 22–32)
CREATININE: 0.64 mg/dL (ref 0.44–1.00)
Chloride: 104 mmol/L (ref 98–111)
GFR calc non Af Amer: >60 mL/min (ref >60)
Glucose, Bld: 106 mg/dL — ABNORMAL HIGH (ref 70–99)
Potassium: 3.5 mmol/L (ref 3.5–5.1)
SODIUM: 138 mmol/L (ref 135–145)
TOTAL PROTEIN: 7.1 g/dL (ref 6.5–8.1)

## 2018-05-05 MED ORDER — IPRATROPIUM-ALBUTEROL 0.5-2.5 (3) MG/3ML IN SOLN
3.0000 mL | Freq: Once | RESPIRATORY_TRACT | Status: AC
  Administered 2018-05-05: 3 mL via RESPIRATORY_TRACT
  Filled 2018-05-05: qty 3

## 2018-05-05 MED ORDER — DOXYCYCLINE HYCLATE 100 MG PO CAPS: 100.0000 mg | ORAL_CAPSULE | Freq: Two times a day (BID) | ORAL | 0 refills | Status: DC

## 2018-05-05 MED ORDER — LORAZEPAM 2 MG/ML IJ SOLN
0.5000 mg | Freq: Once | INTRAMUSCULAR | Status: AC
  Administered 2018-05-05: 0.5 mg via INTRAVENOUS
  Filled 2018-05-05: qty 1

## 2018-05-05 MED ORDER — ALBUTEROL SULFATE (2.5 MG/3ML) 0.083% IN NEBU
5.0000 mg | INHALATION_SOLUTION | Freq: Once | RESPIRATORY_TRACT | Status: AC
  Administered 2018-05-05: 5 mg via RESPIRATORY_TRACT
  Filled 2018-05-05: qty 6

## 2018-05-05 MED ORDER — ALBUTEROL SULFATE HFA 108 (90 BASE) MCG/ACT IN AERS: 2.0000 | INHALATION_SPRAY | RESPIRATORY_TRACT | Status: DC | PRN

## 2018-05-05 MED ORDER — ALBUTEROL SULFATE (2.5 MG/3ML) 0.083% IN NEBU
2.5000 mg | INHALATION_SOLUTION | Freq: Once | RESPIRATORY_TRACT | Status: AC
  Administered 2018-05-05: 2.5 mg via RESPIRATORY_TRACT
  Filled 2018-05-05: qty 3

## 2018-05-05 MED ORDER — METHYLPREDNISOLONE SODIUM SUCC 125 MG IJ SOLR
125.0000 mg | Freq: Once | INTRAMUSCULAR | Status: AC
  Administered 2018-05-05: 125 mg via INTRAVENOUS
  Filled 2018-05-05: qty 2

## 2018-05-05 NOTE — ED Notes (Signed)
RT paged for neb tx.

## 2018-05-05 NOTE — Discharge Instructions (Addendum)
Follow-up with your family doctor next week for recheck return sooner if problems °

## 2018-05-05 NOTE — ED Triage Notes (Signed)
Pt c/o wheezing and SOB that began last night. Hx of asthma. States that she has used albuterol with no relief.

## 2018-05-05 NOTE — ED Provider Notes (Signed)
[name] EMERGENCY DEPARTMENT Provider Note   CSN: [id number] Arrival date & time: 05/05/18  5621     History   Chief Complaint Chief Complaint  Patient presents with  . Shortness of Breath    HPI Mary Jarvis is a 42 y.o. female.  Patient complains of shortness of breath and cough.  The history is provided by the patient. No language interpreter was used.  Shortness of Breath  This is a new problem. The problem occurs continuously.The current episode started more than 2 days ago. The problem has not changed since onset.Associated symptoms include wheezing. Pertinent negatives include no fever, no headaches, no cough, no chest pain, no abdominal pain and no rash. Precipitated by: Unknown. Risk factors: Unknown. She has tried nothing for the symptoms. The treatment provided no relief. She has had no prior hospitalizations. She has had prior ED visits. She has had no prior ICU admissions.    Past Medical History:  Diagnosis Date  . Chronic pain syndrome   . Gastric ulcer   . Generalized anxiety disorder   . Hepatitis C   . Hyperlipidemia   . Hypertension   . Kidney stones   . Liver failure (HCC)   . Peripheral neuropathy   . Tachycardia     Patient Active Problem List   Diagnosis Date Noted  . Hepatitis C 12/12/2012  . 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 KNEE PAIN, LEFT 09/29/2007    Past Surgical History:  Procedure Laterality Date  . FOOT SURGERY    . rt foot surgery    . TUBAL LIGATION       OB History    Gravida  2   Para  2   Term  2   Preterm  0   AB  0   Living        SAB  0   TAB  0   Ectopic  0   Multiple      Live Births               Home Medications    Prior to Admission medications   Medication Sig Start Date End Date Taking? Authorizing Provider  albuterol (PROVENTIL HFA;VENTOLIN HFA) 108 (90 Base) MCG/ACT inhaler Inhale 1-2 puffs into the lungs every 6 (six) hours as needed for wheezing or  shortness of breath.   Yes [provider]  dextromethorphan-guaiFENesin (MUCINEX DM) 30-600 MG 12hr tablet Take 2 tablets by mouth 2 (two) times daily.   Yes [provider]  ibuprofen (ADVIL,MOTRIN) 400 MG tablet Take 400 mg by mouth every 6 (six) hours as needed.   Yes [provider]  acetaminophen-codeine (TYLENOL #3) 300-30 MG tablet Take 1 tablet by mouth every 4 (four) hours as needed for moderate pain. Patient not taking: Reported on 05/05/2018 02/14/18   Triplett, Tammy, PA-C  amoxicillin (AMOXIL) 500 MG capsule Take 1 capsule (500 mg total) by mouth 3 (three) times daily. Patient not taking: Reported on 05/05/2018 05/21/17   Ivery Quale, PA-C  clindamycin (CLEOCIN) 150 MG capsule Take 2 capsules (300 mg total) by mouth 4 (four) times daily. Patient not taking: Reported on 05/05/2018 02/14/18   Triplett, Tammy, PA-C  cyclobenzaprine (FLEXERIL) 10 MG tablet Take 1 tablet (10 mg total) by mouth 2 (two) times daily as needed for muscle spasms. Patient not taking: Reported on 05/05/2018 04/07/17   Raeford Razor, MD  doxycycline (VIBRAMYCIN) 100 MG capsule Take 1 capsule (100 mg total) by mouth 2 (two) times daily.  One po bid x 7 days 05/05/18   Bethann Berkshire, MD  HYDROcodone-acetaminophen (NORCO/VICODIN) 5-325 MG tablet Take 1 tablet by mouth every 4 (four) hours as needed. Patient not taking: Reported on 05/05/2018 02/27/17   Ivery Quale, PA-C  meloxicam (MOBIC) 15 MG tablet Take 1 tablet (15 mg total) by mouth daily as needed for pain. Patient not taking: Reported on 05/05/2018 04/07/17   Raeford Razor, MD  methocarbamol (ROBAXIN) 500 MG tablet Take 1 tablet (500 mg total) by mouth 3 (three) times daily. Patient not taking: Reported on 05/05/2018 02/27/17   Ivery Quale, PA-C    Family History Family History  Problem Relation Age of Onset  . Heart disease Mother   . Diabetes Mother   . Peripheral Artery Disease Father     Social History Social  History   Tobacco Use  . Smoking status: Current Every Day Smoker    Packs/day: 1.00    Types: Cigarettes  . Smokeless tobacco: Never Used  Substance Use Topics  . Alcohol use: Not Currently  . 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  HENT: Negative for congestion, ear discharge and sinus pressure.   Eyes: Negative for discharge.  Respiratory: Positive for shortness of breath and wheezing. Negative for cough.   Cardiovascular: Negative for chest pain.  Gastrointestinal: Negative for abdominal pain and diarrhea.  Genitourinary: Negative for frequency and hematuria.  Musculoskeletal: Negative for back pain.  Skin: Negative for rash.  Neurological: Negative for seizures and headaches.  Psychiatric/Behavioral: Negative for hallucinations.     Physical Exam Updated Vital Signs BP (!) 138/99   Pulse (!) 111   Temp 100 F (37.8 C) (Axillary)   Resp 18   Ht 5' (1.524 m)   Wt 92.5 kg   LMP 04/30/2018 (Exact Date)   SpO2 96%   BMI 39.84 kg/m   Physical Exam  Constitutional: She is oriented to person, place, and time. She appears well-developed.  HENT:  Head: Normocephalic.  Eyes: Conjunctivae and EOM are normal. No scleral icterus.  Neck: Neck supple. No thyromegaly present.  Cardiovascular: Normal rate and regular rhythm. Exam reveals no gallop and no friction rub.  No murmur heard. Pulmonary/Chest: No stridor. She has wheezes. She has no rales. She exhibits no tenderness.  Abdominal: She exhibits no distension. There is no tenderness. There is no rebound.  Musculoskeletal: Normal range of motion. She exhibits no edema.  Lymphadenopathy:    She has no cervical adenopathy.  Neurological: She is oriented to person, place, and time. She exhibits normal muscle tone. Coordination  normal.  Skin: No rash noted. No erythema.  Psychiatric: She has a normal mood and affect. Her behavior is normal.     ED Treatments / Results  Labs (all labs ordered are listed, but only abnormal results are displayed) Labs Reviewed  CBC WITH DIFFERENTIAL/PLATELET - Abnormal; Notable for the following components:      Result Value   Hemoglobin 11.8 (*)    All other components within normal limits  COMPREHENSIVE METABOLIC PANEL - Abnormal; Notable for the following components:   Glucose, Bld 106 (*)    Calcium 8.8 (*)    All other components within normal limits    EKG None  Radiology Dg Chest 2 View  Result Date: 05/05/2018 CLINICAL DATA:  Acute onset shortness of breath yesterday. EXAM: CHEST - 2 VIEW COMPARISON:  CT chest 10/29/2011.  PA and lateral chest 01/11/2016. FINDINGS: There is cardiomegaly without edema. No consolidative process, pneumothorax or effusion. No acute or focal bony abnormality. IMPRESSION: Cardiomegaly without acute disease. Electronically Signed   By: Drusilla Kanner M.D.   On: 05/05/2018 09:54    Procedures Procedures (including critical care time)  Medications Ordered in ED Medications  albuterol (PROVENTIL HFA;VENTOLIN HFA) 108 (90 Base) MCG/ACT inhaler 2 puff (has no administration in time range)  albuterol (PROVENTIL) (2.5 MG/3ML) 0.083% nebulizer solution 5 mg (5 mg Nebulization Given 05/05/18 0951)  ipratropium-albuterol (DUONEB) 0.5-2.5 (3) MG/3ML nebulizer solution 3 mL (3 mLs Nebulization Given 05/05/18 0950)  LORazepam (ATIVAN) injection 0.5 mg (0.5 mg Intravenous Given 05/05/18 0939)  methylPREDNISolone sodium succinate (SOLU-MEDROL) 125 mg/2 mL injection 125 mg (125 mg Intravenous Given 05/05/18 0938)  ipratropium-albuterol (DUONEB) 0.5-2.5 (3) MG/3ML nebulizer solution 3 mL (3 mLs Nebulization Given 05/05/18 1226)  albuterol (PROVENTIL) (2.5 MG/3ML) 0.083% nebulizer solution 2.5 mg (2.5 mg Nebulization Given 05/05/18 1226)      Initial Impression / Assessment and Plan / ED Course  I have reviewed the triage vital signs and the nursing notes.  Pertinent labs & imaging results that were available during my care of the patient were reviewed by me and considered in my medical decision making (see chart for details).   Patient with bronchitis and bronchospasm.  Patient improved with neb treatments.  She was discharged home with doxycycline and albuterol inhaler.  She will follow-up with PCP as needed  Final Clinical Impressions(s) / ED Diagnoses   Final diagnoses:  Bronchitis  SOB (shortness of breath)    ED Discharge Orders         Ordered    doxycycline (VIBRAMYCIN) 100 MG capsule  2 times daily     05/05/18 1420           Bethann Berkshire, MD 05/05/18 1423

## 2018-05-05 NOTE — ED Notes (Signed)
Patient's O2 dropped to 86% while falling asleep. Patient placed on 2 L O2 via [place]. Patient's O2 increased to 92%.

## 2018-05-05 NOTE — ED Notes (Signed)
Patient ambulated around nurses station. Patient's O2 remained 97-99% on room air.

## 2018-05-05 NOTE — ED Notes (Signed)
Patient states she does not want albuterol inhaler. She states she has three at home.

## 2018-06-26 ENCOUNTER — Encounter (HOSPITAL_COMMUNITY): Payer: Self-pay | Admitting: *Deleted

## 2018-06-26 ENCOUNTER — Emergency Department (HOSPITAL_COMMUNITY): Payer: Self-pay

## 2018-06-26 ENCOUNTER — Emergency Department (HOSPITAL_COMMUNITY)
Admission: EM | Admit: 2018-06-26 | Discharge: 2018-06-26 | Disposition: A | Discharge status: Home / Self Care | Payer: Self-pay | Attending: Emergency Medicine | Admitting: Emergency Medicine

## 2018-06-26 DIAGNOSIS — R1084 Generalized abdominal pain: Secondary | ICD-10-CM | POA: Insufficient documentation

## 2018-06-26 DIAGNOSIS — M7989 Other specified soft tissue disorders: Secondary | ICD-10-CM

## 2018-06-26 DIAGNOSIS — L03115 Cellulitis of right lower limb: Secondary | ICD-10-CM | POA: Insufficient documentation

## 2018-06-26 DIAGNOSIS — F17210 Nicotine dependence, cigarettes, uncomplicated: Secondary | ICD-10-CM | POA: Insufficient documentation

## 2018-06-26 DIAGNOSIS — M545 Low back pain, unspecified: Secondary | ICD-10-CM

## 2018-06-26 DIAGNOSIS — I10 Essential (primary) hypertension: Secondary | ICD-10-CM | POA: Insufficient documentation

## 2018-06-26 LAB — URINALYSIS, ROUTINE W REFLEX MICROSCOPIC
Bilirubin Urine: NEGATIVE
Glucose, UA: NEGATIVE mg/dL
Ketones, ur: NEGATIVE mg/dL
Leukocytes, UA: NEGATIVE
Nitrite: NEGATIVE
Protein, ur: NEGATIVE mg/dL
Specific Gravity, Urine: 1.01 (ref 1.005–1.030)
pH: 7 (ref 5.0–8.0)

## 2018-06-26 LAB — CBC WITH DIFFERENTIAL/PLATELET
Abs Immature Granulocytes: 0.03 10*3/uL (ref 0.00–0.07)
BASOS ABS: 0 10*3/uL (ref 0.0–0.1)
Basophils Relative: 0 %
EOS ABS: 0.2 10*3/uL (ref 0.0–0.5)
EOS PCT: 2 %
HEMATOCRIT: 38.1 % (ref 36.0–46.0)
Hemoglobin: 11.6 g/dL — ABNORMAL LOW (ref 12.0–15.0)
Immature Granulocytes: 0 %
Lymphocytes Relative: 29 %
Lymphs Abs: 2.3 10*3/uL (ref 0.7–4.0)
MCH: 27 pg (ref 26.0–34.0)
MCHC: 30.4 g/dL (ref 30.0–36.0)
MCV: 88.6 fL (ref 80.0–100.0)
Monocytes Absolute: 0.4 10*3/uL (ref 0.1–1.0)
Monocytes Relative: 4 %
NRBC: 0 % (ref 0.0–0.2)
Neutro Abs: 5.1 10*3/uL (ref 1.7–7.7)
Neutrophils Relative %: 65 %
Platelets: 283 10*3/uL (ref 150–400)
RBC: 4.3 MIL/uL (ref 3.87–5.11)
RDW: 14.9 % (ref 11.5–15.5)
WBC: 8 10*3/uL (ref 4.0–10.5)

## 2018-06-26 LAB — BASIC METABOLIC PANEL
Anion gap: 7 (ref 5–15)
BUN: 6 mg/dL (ref 6–20)
CO2: 26 mmol/L (ref 22–32)
Calcium: 8.7 mg/dL — ABNORMAL LOW (ref 8.9–10.3)
Chloride: 104 mmol/L (ref 98–111)
Creatinine, Ser: 0.63 mg/dL (ref 0.44–1.00)
GFR calc Af Amer: >60 mL/min (ref >60)
GFR calc non Af Amer: >60 mL/min (ref >60)
Glucose, Bld: 150 mg/dL — ABNORMAL HIGH (ref 70–99)
Potassium: 3.7 mmol/L (ref 3.5–5.1)
SODIUM: 137 mmol/L (ref 135–145)

## 2018-06-26 LAB — PREGNANCY, URINE: Preg Test, Ur: NEGATIVE

## 2018-06-26 MED ORDER — ENOXAPARIN SODIUM 100 MG/ML ~~LOC~~ SOLN
1.0000 mg/kg | Freq: Once | SUBCUTANEOUS | Status: AC
  Administered 2018-06-26: 95 mg via SUBCUTANEOUS
  Filled 2018-06-26: qty 1

## 2018-06-26 MED ORDER — DOXYCYCLINE HYCLATE 100 MG PO TABS
100.0000 mg | ORAL_TABLET | Freq: Once | ORAL | Status: AC
  Administered 2018-06-26: 100 mg via ORAL
  Filled 2018-06-26: qty 1

## 2018-06-26 MED ORDER — DOXYCYCLINE HYCLATE 100 MG PO CAPS: 100.0000 mg | ORAL_CAPSULE | Freq: Two times a day (BID) | ORAL | 0 refills | Status: DC

## 2018-06-26 MED ORDER — FENTANYL CITRATE (PF) 100 MCG/2ML IJ SOLN
100.0000 ug | Freq: Once | INTRAMUSCULAR | Status: AC
  Administered 2018-06-26: 100 ug via INTRAVENOUS
  Filled 2018-06-26: qty 2

## 2018-06-26 NOTE — ED Triage Notes (Signed)
Pt with right leg swelling for past 3 days, pt states she is on fluid pills as well. Pt with "borderline diabetes" .  Pt worked last night and stands on her feet a lot.

## 2018-06-26 NOTE — ED Notes (Signed)
Bladder scanned read 84mL after void

## 2018-06-26 NOTE — ED Provider Notes (Signed)
[facility]ERGENCY [facility]PARTMENT Provider Note   CSN: [id number] Arrival date & time: 06/26/18  1235     History   Chief Complaint Chief Complaint  Patient presents with  . Leg Swelling    HPI Mary Jarvis is a 42 y.o. female.  HPI  42 year old female presents with multiple complaints.  The chief complaint is right leg swelling and pain.  She has noticed her calf, ankle and foot being swollen starting about 3 or 4 days ago.  The left lower extremity is not swollen.  It is also painful and has pressure.  No weakness or numbness in the extremity.  No recent travel or surgery.  No trauma noted.  Pain and swelling does not extend above the knee.  Over the same time she is also noticed what she calls urinary incontinence.  She states she will all of a sudden feel like she has to go to the bathroom and then does not have time to make it there and sometimes has urinated on herself.  There is no dysuria or hematuria.  At the same time she is been having right-sided low back pain.  No midline back pain or radiation of the back pain.  No abdominal pain.  There is no chest pain or shortness of breath. Pain is about a 6/10. The back pain comes and goes.  Past Medical History:  Diagnosis Date  . Chronic pain syndrome   . Gastric ulcer   . Generalized anxiety disorder   . Hepatitis C   . Hyperlipidemia   . Hypertension   . Kidney stones   . Liver failure (HCC)   . Peripheral neuropathy   . Tachycardia     Patient Active Problem List   Diagnosis Date Noted  . Hepatitis C 12/12/2012  . 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 KNEE PAIN, LEFT 09/29/2007    Past Surgical History:  Procedure Laterality Date  . FOOT SURGERY    . rt foot surgery    . TUBAL LIGATION       OB History    Gravida  2   Para  2   Term  2   Preterm  0   AB  0   Living        SAB  0   TAB  0   Ectopic  0   Multiple      Live Births               Home Medications    Prior  to Admission medications   Medication Sig Start Date End Date Taking? Authorizing Provider  acetaminophen (TYLENOL) 325 MG tablet Take 325 mg by mouth every 6 (six) hours as needed for mild pain or moderate pain.   Yes [provider]  albuterol (PROVENTIL HFA;VENTOLIN HFA) 108 (90 Base) MCG/ACT inhaler Inhale 1-2 puffs into the lungs every 6 (six) hours as needed for wheezing or shortness of breath.    [provider]  doxycycline (VIBRAMYCIN) 100 MG capsule Take 1 capsule (100 mg total) by mouth 2 (two) times daily. One po bid x 7 days 06/26/18   Pricilla LovelessGoldston, Didier Brandenburg, MD    Family History Family History  Problem Relation Age of Onset  . Heart disease Mother   . Diabetes Mother   . Peripheral Artery Disease Father     Social History Social History   Tobacco Use  . Smoking status: Current Every Day Smoker    Packs/day: 1.00    Types: Cigarettes  .  Smokeless tobacco: Never Used  Substance Use Topics  . Alcohol use: Not Currently  . Drug use: Not Currently    Comment: Methadone - off for 5 months     Allergies   Aspirin; Ibuprofen; Tramadol; and Ancef [cefazolin]   Review of Systems Review of Systems  Constitutional: Negative for fever.  Respiratory: Negative for shortness of breath.   Cardiovascular: Positive for leg swelling. Negative for chest pain.  Gastrointestinal: Negative for abdominal pain.  Genitourinary: Positive for frequency and urgency. Negative for dysuria.  Musculoskeletal: Positive for back pain and myalgias.  Neurological: Negative for weakness and numbness.  All other systems reviewed and are negative.    Physical Exam Updated Vital Signs BP (!) 139/109 (BP Location: Right Arm)   Pulse 85   Temp 98.1 F (36.7 C) (Oral)   Resp 18   Ht 5' (1.524 m)   Wt 93 kg   LMP 04/28/2018 Comment: irregular per pt  SpO2 100%   BMI 40.04 kg/m   Physical Exam  Constitutional: She appears well-developed and well-nourished. No distress.  obese    HENT:  Head: Normocephalic and atraumatic.  Right Ear: External ear normal.  Left Ear: External ear normal.  Nose: Nose normal.  Eyes: Right eye exhibits no discharge. Left eye exhibits no discharge.  Cardiovascular: Normal rate, regular rhythm and normal heart sounds.  Pulses:      Dorsalis pedis pulses are 2+ on the right side, and 2+ on the left side.  Pulmonary/Chest: Effort normal and breath sounds normal.  Abdominal: Soft. There is no tenderness.  Musculoskeletal:       Right ankle: She exhibits swelling.       Lumbar back: She exhibits tenderness. She exhibits no bony tenderness.       Back:       Right lower leg: She exhibits tenderness and swelling.       Right foot: There is swelling.  RLE is symmetrically swollen from lower leg inferior to knee down to foot. No increased warmth, redness or other skin changes. Normal strength/sensation. There is some mild tenderness to the Right lower anterior leg above the ankle  Neurological: She is alert.  Skin: Skin is warm and dry. She is not diaphoretic.  Psychiatric: Her mood appears not anxious.  Nursing note and vitals reviewed.    ED Treatments / Results  Labs (all labs ordered are listed, but only abnormal results are displayed) Labs Reviewed  CBC WITH DIFFERENTIAL/PLATELET - Abnormal; Notable for the following components:      Result Value   Hemoglobin 11.6 (*)    All other components within normal limits  BASIC METABOLIC PANEL - Abnormal; Notable for the following components:   Glucose, Bld 150 (*)    Calcium 8.7 (*)    All other components within normal limits  URINALYSIS, ROUTINE W REFLEX MICROSCOPIC - Abnormal; Notable for the following components:   Hgb urine dipstick SMALL (*)    Bacteria, UA RARE (*)    All other components within normal limits  PREGNANCY, URINE    EKG None  Radiology Dg Tibia/fibula Right  Result Date: 06/26/2018 CLINICAL DATA:  Right leg swelling and redness 2 days. EXAM: RIGHT TIBIA  AND FIBULA - 2 VIEW COMPARISON:  None. FINDINGS: Old distal fibular fracture. No acute fracture or dislocation. No air within the soft tissues. Exam is otherwise unremarkable. IMPRESSION: No acute findings. Electronically Signed   By: Elberta Fortis M.D.   On: 06/26/2018 17:17   Ct  Renal Stone Study  Result Date: 06/26/2018 CLINICAL DATA:  Flank pain lower extremity swelling EXAM: CT ABDOMEN AND PELVIS WITHOUT CONTRAST TECHNIQUE: Multidetector CT imaging of the abdomen and pelvis was performed following the standard protocol without IV contrast. COMPARISON:  CT 08/28/2015 FINDINGS: Lower chest: Lung bases are clear.  The heart size is normal. Hepatobiliary: Stable vague hypodensity within the left hepatic lobe likely in a benign given absence of change. No calcified gallstones or biliary dilatation Pancreas: Unremarkable. No pancreatic ductal dilatation or surrounding inflammatory changes. Spleen: Normal in size without focal abnormality. Adrenals/Urinary Tract: Adrenal glands are unremarkable. Kidneys are normal, without renal calculi, focal lesion, or hydronephrosis. Bladder is unremarkable. Stomach/Bowel: Probable pill fragments in the stomach. No dilated small bowel. No colon wall thickening. Negative appendix. Vascular/Lymphatic: No significant vascular findings are present. No enlarged abdominal or pelvic lymph nodes. Reproductive: Uterus and bilateral adnexa are unremarkable. Other: Negative for free air or free fluid. Small fat in the umbilical region. Musculoskeletal: No acute or significant osseous findings. IMPRESSION: 1. No CT evidence for acute intra-abdominal or pelvic abnormality. Negative for kidney stones or hydronephrosis. Electronically Signed   By: Jasmine Pang M.D.   On: 06/26/2018 17:20    Procedures Procedures (including critical care time)  Medications Ordered in ED Medications  fentaNYL (SUBLIMAZE) injection 100 mcg (100 mcg Intravenous Given 06/26/18 1622)  enoxaparin (LOVENOX)  injection 95 mg (95 mg Subcutaneous Given 06/26/18 1812)  doxycycline (VIBRA-TABS) tablet 100 mg (100 mg Oral Given 06/26/18 1812)     Initial Impression / Assessment and Plan / ED Course  I have reviewed the triage vital signs and the nursing notes.  Pertinent labs & imaging results that were available during my care of the patient were reviewed by me and considered in my medical decision making (see chart for details).     Unclear cause of the patient's feelings of incontinence.  She does not have any urinary retention on bladder scan.  She does not have any midline back pain or neurologic symptoms in her legs.  No indication that this would be a spinal cord emergency.  As for her right-sided pain with urinary symptoms a CT stone study was obtained which does not show obvious ureteral stone or intra-abdominal emergency.  On reexamination her leg is still swollen but it is a little warmer and there is some vague erythema.  I will cover her for cellulitis and she will need to come back tomorrow for DVT ultrasound as this is not available tonight.  She is been given a dose of Lovenox subcutaneously as well.  She has no chest pain or shortness of breath.  We discussed strict return precautions but she appears stable for discharge at this time.  Final Clinical Impressions(s) / ED Diagnoses   Final diagnoses:  Right leg swelling  Cellulitis of right lower extremity  Acute right-sided low back pain without sciatica    ED Discharge Orders         Ordered    US Venous Img Lower Unilateral Right  Status:  Canceled     06/26/18 1600    US Venous Img Lower Unilateral Right     06/26/18 1600    doxycycline (VIBRAMYCIN) 100 MG capsule  2 times daily     06/26/18 1751           Pricilla Loveless, MD 06/26/18 1830

## 2018-06-26 NOTE — ED Notes (Signed)
Per MD order, Selena BattenKim from xray scheduled R venus leg ultrasound for 3:30pm December 2nd, 2019.

## 2018-06-26 NOTE — Discharge Instructions (Addendum)
Return here tomorrow as scheduled for the blood clot ultrasound.  If you develop fever, vomiting, trouble breathing, chest pain, weakness or numbness in your arms or legs, severe back pain, incontinence of your bowels or bladder or any other new/concerning symptoms and return to the ER for evaluation.  If your ultrasound is negative, continue the antibiotics as prescribed for skin infection.

## 2018-06-27 ENCOUNTER — Ambulatory Visit (HOSPITAL_COMMUNITY)
Admission: RE | Admit: 2018-06-27 | Discharge: 2018-06-27 | Disposition: A | Discharge status: Home / Self Care | Payer: Self-pay | Source: Ambulatory Visit | Attending: Emergency Medicine | Admitting: Emergency Medicine

## 2018-06-27 DIAGNOSIS — M79661 Pain in right lower leg: Secondary | ICD-10-CM | POA: Insufficient documentation

## 2018-06-27 DIAGNOSIS — M7989 Other specified soft tissue disorders: Secondary | ICD-10-CM | POA: Insufficient documentation

## 2018-06-27 NOTE — ED Provider Notes (Signed)
Patient presented to the radiology department today for US as an extension of their workup for leg swelling on 06/26/2018. Please see previous provider's note for details of that visit to include history, physical and medical decision making.   I only relayed the results of the study to them.   Also discussed with them reasons to follow up at the emergency department otherwise continue following up with her primary doctor as directed by previous provider.   Marily MemosMesner, Aseel Truxillo, MD 06/27/18 2212

## 2018-06-28 ENCOUNTER — Other Ambulatory Visit (HOSPITAL_COMMUNITY): Payer: Self-pay

## 2018-07-23 ENCOUNTER — Emergency Department (HOSPITAL_COMMUNITY): Payer: Self-pay

## 2018-07-23 ENCOUNTER — Emergency Department (HOSPITAL_COMMUNITY)
Admission: EM | Admit: 2018-07-23 | Discharge: 2018-07-23 | Disposition: A | Discharge status: Home / Self Care | Payer: Self-pay | Attending: Emergency Medicine | Admitting: Emergency Medicine

## 2018-07-23 ENCOUNTER — Encounter (HOSPITAL_COMMUNITY): Payer: Self-pay

## 2018-07-23 ENCOUNTER — Other Ambulatory Visit: Payer: Self-pay

## 2018-07-23 DIAGNOSIS — I10 Essential (primary) hypertension: Secondary | ICD-10-CM | POA: Diagnosis not present

## 2018-07-23 DIAGNOSIS — Y9389 Activity, other specified: Secondary | ICD-10-CM | POA: Diagnosis not present

## 2018-07-23 DIAGNOSIS — T148XXA Other injury of unspecified body region, initial encounter: Secondary | ICD-10-CM | POA: Diagnosis not present

## 2018-07-23 DIAGNOSIS — S299XXA Unspecified injury of thorax, initial encounter: Secondary | ICD-10-CM | POA: Diagnosis not present

## 2018-07-23 DIAGNOSIS — Z79899 Other long term (current) drug therapy: Secondary | ICD-10-CM | POA: Diagnosis not present

## 2018-07-23 DIAGNOSIS — Y92410 Unspecified street and highway as the place of occurrence of the external cause: Secondary | ICD-10-CM | POA: Diagnosis not present

## 2018-07-23 DIAGNOSIS — Y999 Unspecified external cause status: Secondary | ICD-10-CM | POA: Insufficient documentation

## 2018-07-23 DIAGNOSIS — R569 Unspecified convulsions: Secondary | ICD-10-CM | POA: Diagnosis not present

## 2018-07-23 DIAGNOSIS — M542 Cervicalgia: Secondary | ICD-10-CM | POA: Diagnosis not present

## 2018-07-23 DIAGNOSIS — R51 Headache: Secondary | ICD-10-CM | POA: Insufficient documentation

## 2018-07-23 DIAGNOSIS — R102 Pelvic and perineal pain: Secondary | ICD-10-CM | POA: Diagnosis not present

## 2018-07-23 DIAGNOSIS — T07XXXA Unspecified multiple injuries, initial encounter: Secondary | ICD-10-CM

## 2018-07-23 DIAGNOSIS — F17210 Nicotine dependence, cigarettes, uncomplicated: Secondary | ICD-10-CM | POA: Diagnosis not present

## 2018-07-23 LAB — CBC WITH DIFFERENTIAL/PLATELET
Abs Immature Granulocytes: 0.04 10*3/uL (ref 0.00–0.07)
BASOS ABS: 0 10*3/uL (ref 0.0–0.1)
Basophils Relative: 0 %
Eosinophils Absolute: 0.2 10*3/uL (ref 0.0–0.5)
Eosinophils Relative: 2 %
HEMATOCRIT: 38.8 % (ref 36.0–46.0)
Hemoglobin: 11.8 g/dL — ABNORMAL LOW (ref 12.0–15.0)
Immature Granulocytes: 1 %
Lymphocytes Relative: 26 %
Lymphs Abs: 2.3 10*3/uL (ref 0.7–4.0)
MCH: 26.1 pg (ref 26.0–34.0)
MCHC: 30.4 g/dL (ref 30.0–36.0)
MCV: 85.8 fL (ref 80.0–100.0)
Monocytes Absolute: 0.5 10*3/uL (ref 0.1–1.0)
Monocytes Relative: 5 %
NRBC: 0 % (ref 0.0–0.2)
Neutro Abs: 5.8 10*3/uL (ref 1.7–7.7)
Neutrophils Relative %: 66 %
Platelets: 251 10*3/uL (ref 150–400)
RBC: 4.52 MIL/uL (ref 3.87–5.11)
RDW: 15.2 % (ref 11.5–15.5)
WBC: 8.9 10*3/uL (ref 4.0–10.5)

## 2018-07-23 LAB — RAPID URINE DRUG SCREEN, HOSP PERFORMED
Amphetamines: NOT DETECTED
Barbiturates: NOT DETECTED
Benzodiazepines: POSITIVE — AB
Cocaine: NOT DETECTED
Opiates: NOT DETECTED
Tetrahydrocannabinol: POSITIVE — AB

## 2018-07-23 LAB — URINALYSIS, ROUTINE W REFLEX MICROSCOPIC
BILIRUBIN URINE: NEGATIVE
Glucose, UA: NEGATIVE mg/dL
Ketones, ur: NEGATIVE mg/dL
Leukocytes, UA: NEGATIVE
Nitrite: NEGATIVE
Protein, ur: NEGATIVE mg/dL
Specific Gravity, Urine: 1.005 (ref 1.005–1.030)
pH: 6 (ref 5.0–8.0)

## 2018-07-23 LAB — COMPREHENSIVE METABOLIC PANEL
ALT: 13 U/L (ref 0–44)
ANION GAP: 8 (ref 5–15)
AST: 22 U/L (ref 15–41)
Albumin: 3.9 g/dL (ref 3.5–5.0)
Alkaline Phosphatase: 61 U/L (ref 38–126)
BUN: 7 mg/dL (ref 6–20)
CO2: 22 mmol/L (ref 22–32)
Calcium: 8.7 mg/dL — ABNORMAL LOW (ref 8.9–10.3)
Chloride: 105 mmol/L (ref 98–111)
Creatinine, Ser: 0.72 mg/dL (ref 0.44–1.00)
GFR calc non Af Amer: >60 mL/min (ref >60)
Glucose, Bld: 89 mg/dL (ref 70–99)
Potassium: 4.3 mmol/L (ref 3.5–5.1)
SODIUM: 135 mmol/L (ref 135–145)
Total Bilirubin: 0.9 mg/dL (ref 0.3–1.2)
Total Protein: 7.3 g/dL (ref 6.5–8.1)

## 2018-07-23 LAB — PREGNANCY, URINE: Preg Test, Ur: NEGATIVE

## 2018-07-23 LAB — ETHANOL: Alcohol, Ethyl (B): <10 mg/dL (ref <10)

## 2018-07-23 MED ORDER — LORAZEPAM 2 MG/ML IJ SOLN
INTRAMUSCULAR | Status: AC
  Filled 2018-07-23: qty 1

## 2018-07-23 MED ORDER — ONDANSETRON HCL 4 MG/2ML IJ SOLN
4.0000 mg | Freq: Once | INTRAMUSCULAR | Status: AC
  Administered 2018-07-23: 4 mg via INTRAVENOUS
  Filled 2018-07-23: qty 2

## 2018-07-23 MED ORDER — LORAZEPAM 2 MG/ML IJ SOLN
2.0000 mg | Freq: Once | INTRAMUSCULAR | Status: AC
  Administered 2018-07-23: 2 mg via INTRAVENOUS

## 2018-07-23 MED ORDER — OXYCODONE-ACETAMINOPHEN 5-325 MG PO TABS
2.0000 | ORAL_TABLET | Freq: Once | ORAL | Status: AC
  Administered 2018-07-23: 2 via ORAL
  Filled 2018-07-23: qty 2

## 2018-07-23 MED ORDER — LORAZEPAM 2 MG/ML IJ SOLN
INTRAMUSCULAR | Status: AC
  Administered 2018-07-23: 2 mg via INTRAVENOUS
  Filled 2018-07-23: qty 1

## 2018-07-23 MED ORDER — FENTANYL CITRATE (PF) 100 MCG/2ML IJ SOLN
100.0000 ug | INTRAMUSCULAR | Status: DC | PRN
  Administered 2018-07-23: 100 ug via INTRAVENOUS
  Filled 2018-07-23: qty 2

## 2018-07-23 MED ORDER — LORAZEPAM 2 MG/ML IJ SOLN: 2.0000 mg | Freq: Once | INTRAMUSCULAR | Status: DC

## 2018-07-23 MED ORDER — IOPAMIDOL (ISOVUE-300) INJECTION 61%
100.0000 mL | Freq: Once | INTRAVENOUS | Status: AC | PRN
  Administered 2018-07-23: 100 mL via INTRAVENOUS

## 2018-07-23 MED ORDER — LEVETIRACETAM 500 MG PO TABS: 500.0000 mg | ORAL_TABLET | Freq: Two times a day (BID) | ORAL | 1 refills | Status: DC

## 2018-07-23 MED ORDER — LEVETIRACETAM IN NACL 1000 MG/100ML IV SOLN
1000.0000 mg | Freq: Once | INTRAVENOUS | Status: AC
  Administered 2018-07-23: 1000 mg via INTRAVENOUS
  Filled 2018-07-23: qty 100

## 2018-07-23 NOTE — ED Notes (Signed)
Pt back from CT

## 2018-07-23 NOTE — ED Notes (Signed)
Have provided an ice pack to pt .

## 2018-07-23 NOTE — ED Notes (Signed)
Pt placed on telemetry.
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
## 2018-07-23 NOTE — ED Notes (Addendum)
Pt had a seizure starting at 1520 and lasted at 1525. Pt is now postictal. Dr. Effie ShyWentz at bedside. Family came out to notify us of pt seizing at 1520. Immediately this nurse went to bedside to help stabilize. Called for help from KosciuskoBrandy RN and Veterinary surgeonobin RN. NT Tori told to go notify Dr. Effie ShyWentz that pt was having a seizure.

## 2018-07-23 NOTE — Discharge Instructions (Addendum)
Start taking your Keppra, tomorrow.  For pain, use Tylenol.  Use ice on the sore spot 3 times a day for 2 days, after that use heat.  Follow-up with your primary care doctor for ongoing management, and prescription refills.
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
## 2018-07-23 NOTE — ED Notes (Addendum)
Pt is more lethargic and drowsy. Is answering questions, but is sleepy. Went in to give pt her Fentanyl and noticed a Suboxone packet lying on her bed. Pt quickly snatched up Suboxone and stated she did not take it today. Would not let this nurse check to see if anything was in package. Have notified Dr. Effie ShyWentz

## 2018-07-23 NOTE — ED Notes (Signed)
Pt is having left sided pain to all areas. Abrasions to face. Limited ROM to left arm.

## 2018-07-23 NOTE — ED Notes (Signed)
Seizure pads in place

## 2018-07-23 NOTE — ED Notes (Signed)
Pt to xray and CT
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
## 2018-10-03 ENCOUNTER — Ambulatory Visit: Payer: Self-pay

## 2018-11-07 ENCOUNTER — Ambulatory Visit: Payer: Self-pay | Attending: Oncology

## 2018-11-09 ENCOUNTER — Other Ambulatory Visit: Payer: Self-pay | Admitting: Family Medicine

## 2018-11-09 DIAGNOSIS — N644 Mastodynia: Secondary | ICD-10-CM

## 2018-12-05 ENCOUNTER — Other Ambulatory Visit: Payer: Self-pay

## 2018-12-05 ENCOUNTER — Emergency Department (HOSPITAL_COMMUNITY)
Admission: EM | Admit: 2018-12-05 | Discharge: 2018-12-05 | Disposition: A | Discharge status: Home / Self Care | Payer: Self-pay | Attending: Emergency Medicine | Admitting: Emergency Medicine

## 2018-12-05 ENCOUNTER — Encounter (HOSPITAL_COMMUNITY): Payer: Self-pay

## 2018-12-05 DIAGNOSIS — I10 Essential (primary) hypertension: Secondary | ICD-10-CM | POA: Insufficient documentation

## 2018-12-05 DIAGNOSIS — M5432 Sciatica, left side: Secondary | ICD-10-CM

## 2018-12-05 DIAGNOSIS — M5442 Lumbago with sciatica, left side: Secondary | ICD-10-CM | POA: Insufficient documentation

## 2018-12-05 DIAGNOSIS — F17210 Nicotine dependence, cigarettes, uncomplicated: Secondary | ICD-10-CM | POA: Insufficient documentation

## 2018-12-05 MED ORDER — PREDNISONE 10 MG (21) PO TBPK: ORAL_TABLET | ORAL | 0 refills | Status: DC

## 2018-12-05 MED ORDER — PREDNISONE 10 MG (21) PO TBPK: ORAL_TABLET | ORAL | 0 refills | Status: AC

## 2018-12-05 MED ORDER — CYCLOBENZAPRINE HCL 10 MG PO TABS: 10.0000 mg | ORAL_TABLET | Freq: Two times a day (BID) | ORAL | 0 refills | Status: DC | PRN

## 2018-12-05 NOTE — ED Triage Notes (Signed)
Pt states she was moving furniture over the weekend and woke up yesterday morning with low back pain that radiates down her left leg.

## 2018-12-05 NOTE — ED Provider Notes (Signed)
[facility] EMERGENCY DEPARTMENT Provider Note   CSN: [id number] Arrival date & time: 12/05/18  1643    History   Chief Complaint Chief Complaint  Patient presents with   Back Pain    HPI Mary Jarvis is a 43 y.o. female.     HPI Patient presented to the emergency room for evaluation of back pain.  Patient was moving furniture over the weekend.  She does not recall any specific injury but yesterday morning she started having pain and discomfort in her lower back.  It does radiate down her leg.  She feels like it is in the buttock and goes down to her knee.  Pain increases with certain movements and positions.  She does not feel like she can twist or turn properly.  She does not have any trouble walking.  She denies any weakness but she has noticed some tingling in her left leg. 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 Authorizing Provider  acetaminophen (TYLENOL) 325 MG tablet Take 325 mg by mouth every 6 (six) hours as needed for mild pain or moderate pain.    [provider]  albuterol (PROVENTIL HFA;VENTOLIN HFA) 108 (90 Base) MCG/ACT inhaler Inhale 1-2 puffs into the lungs every 6 (six) hours as  needed for wheezing or shortness of breath.    [provider]  cyclobenzaprine (FLEXERIL) 10 MG tablet Take 1 tablet (10 mg total) by mouth 2 (two) times daily as needed for muscle spasms. 12/05/18   Linwood Dibbles, MD  doxycycline (VIBRAMYCIN) 100 MG capsule Take 1 capsule (100 mg total) by mouth 2 (two) times daily. One po bid x 7 days Patient not taking: Reported on 07/23/2018 06/26/18   Pricilla Loveless, MD  levETIRAcetam (KEPPRA) 500 MG tablet Take 1 tablet (500 mg total) by mouth 2 (two) times daily. 07/23/18   Mancel Bale, MD  predniSONE (STERAPRED UNI-PAK 21 TAB) 10 MG (21) TBPK tablet Take 5 tablets (50 mg total) by mouth daily for 2 days, THEN 4 tablets (40 mg total) daily for 2 days, THEN 3 tablets (30 mg total) daily for 2 days, THEN 2 tablets (20 mg total) daily for 2 days, THEN 1 tablet (10 mg total) daily for 2 days. 12/05/18 12/15/18  Linwood Dibbles, MD    Family History Family History  Problem  Relation Age of Onset   Heart disease Mother    Diabetes Mother    Peripheral Artery Disease Father     Social History Social History   Tobacco Use   Smoking status: Current Every Day Smoker    Packs/day: 1.00    Types: Cigarettes   Smokeless tobacco: Never Used  Substance Use Topics   Alcohol use: Not Currently   Drug use: Yes    Types: Marijuana    Comment: Methadone - off for 5 months     Allergies   Aspirin; Ibuprofen; Tramadol; and Ancef [cefazolin]   Review of Systems Review of Systems  All other systems reviewed and are negative.    Physical Exam Updated Vital Signs BP 114/82 (BP Location: Left Arm)    Pulse 71    Temp 97.9 F (36.6 C) (Oral)    Ht 1.549 m (5\' 1" )    Wt 89.4 kg    LMP 11/21/2018    SpO2 99%    BMI 37.22 kg/m   Physical Exam Vitals signs and nursing note reviewed.  Constitutional:      General: She is not in acute distress.    Appearance: She is well-developed.  HENT:     Head: Normocephalic and atraumatic.     Right Ear:  External ear normal.     Left Ear: External ear normal.  Eyes:     General: No scleral icterus.       Right eye: No discharge.        Left eye: No discharge.     Conjunctiva/sclera: Conjunctivae normal.  Neck:     Musculoskeletal: Neck supple.     Trachea: No tracheal deviation.  Cardiovascular:     Rate and Rhythm: Normal rate.  Pulmonary:     Effort: Pulmonary effort is normal. No respiratory distress.     Breath sounds: No stridor.  Abdominal:     General: There is no distension.  Musculoskeletal:        General: No swelling or deformity.     Lumbar back: She exhibits no swelling, no edema and no deformity.  Skin:    General: Skin is warm and dry.     Findings: No rash.  Neurological:     Mental Status: She is alert.     Cranial Nerves: Cranial nerve deficit: no gross deficits.     Comments: 5 out of strength bilateral lower extremities, normal plantar flexion and dorsiflexion, normal sensation, normal patellar and Achilles reflexes bilaterally      ED Treatments / Results    Initial Impression / Assessment and Plan / ED Course  I have reviewed the triage vital signs and the nursing notes.  Pertinent labs & imaging results that were available during my care of the patient were reviewed by me and considered in my medical decision making (see chart for details).    No sign of acute neurological or vascular emergency associated with pt's back pain.  May have a component of sciatica.  Safe for outpatient follow up.   Final Clinical Impressions(s) / ED Diagnoses   Final diagnoses:  Sciatica, left side    ED Discharge Orders         Ordered    predniSONE (STERAPRED UNI-PAK 21 TAB) 10 MG (21) TBPK tablet  Status:  Discontinued     12/05/18 1713    cyclobenzaprine (FLEXERIL) 10 MG tablet  2 times daily PRN     12/05/18 1713    predniSONE (STERAPRED UNI-PAK 21  TAB) 10 MG (21) TBPK tablet     12/05/18 1714           Linwood Dibbles, MD 12/05/18 1718

## 2018-12-05 NOTE — ED Triage Notes (Signed)
Pt ambulated from triage to assign room without difficultly.

## 2018-12-05 NOTE — Discharge Instructions (Addendum)
Take the medications as prescribed, follow-up with your doctor if the symptoms are not improving within a week, return to the emergency room for weakness, incontinence or other concerning symptoms

## 2019-01-12 ENCOUNTER — Encounter (HOSPITAL_COMMUNITY): Payer: Self-pay | Admitting: Emergency Medicine

## 2019-01-12 ENCOUNTER — Other Ambulatory Visit: Payer: Self-pay

## 2019-01-12 ENCOUNTER — Emergency Department (HOSPITAL_COMMUNITY)
Admission: EM | Admit: 2019-01-12 | Discharge: 2019-01-12 | Disposition: A | Discharge status: Home / Self Care | Payer: Self-pay | Attending: Emergency Medicine | Admitting: Emergency Medicine

## 2019-01-12 DIAGNOSIS — K2960 Other gastritis without bleeding: Secondary | ICD-10-CM | POA: Insufficient documentation

## 2019-01-12 DIAGNOSIS — Z79899 Other long term (current) drug therapy: Secondary | ICD-10-CM | POA: Insufficient documentation

## 2019-01-12 DIAGNOSIS — I10 Essential (primary) hypertension: Secondary | ICD-10-CM | POA: Insufficient documentation

## 2019-01-12 DIAGNOSIS — T39395A Adverse effect of other nonsteroidal anti-inflammatory drugs [NSAID], initial encounter: Secondary | ICD-10-CM | POA: Insufficient documentation

## 2019-01-12 DIAGNOSIS — F17210 Nicotine dependence, cigarettes, uncomplicated: Secondary | ICD-10-CM | POA: Insufficient documentation

## 2019-01-12 LAB — COMPREHENSIVE METABOLIC PANEL
ALT: 12 U/L (ref 0–44)
AST: 16 U/L (ref 15–41)
Albumin: 3.7 g/dL (ref 3.5–5.0)
Alkaline Phosphatase: 51 U/L (ref 38–126)
Anion gap: 9 (ref 5–15)
BUN: 8 mg/dL (ref 6–20)
CO2: 24 mmol/L (ref 22–32)
Calcium: 8.8 mg/dL — ABNORMAL LOW (ref 8.9–10.3)
Chloride: 103 mmol/L (ref 98–111)
Creatinine, Ser: 0.59 mg/dL (ref 0.44–1.00)
GFR calc Af Amer: >60 mL/min (ref >60)
GFR calc non Af Amer: >60 mL/min (ref >60)
Glucose, Bld: 92 mg/dL (ref 70–99)
Potassium: 3.7 mmol/L (ref 3.5–5.1)
Sodium: 136 mmol/L (ref 135–145)
Total Bilirubin: 0.5 mg/dL (ref 0.3–1.2)
Total Protein: 6.7 g/dL (ref 6.5–8.1)

## 2019-01-12 LAB — CBC WITH DIFFERENTIAL/PLATELET
Abs Immature Granulocytes: 0.02 10*3/uL (ref 0.00–0.07)
Basophils Absolute: 0 10*3/uL (ref 0.0–0.1)
Basophils Relative: 1 %
Eosinophils Absolute: 0.1 10*3/uL (ref 0.0–0.5)
Eosinophils Relative: 2 %
HCT: 39.7 % (ref 36.0–46.0)
Hemoglobin: 12.5 g/dL (ref 12.0–15.0)
Immature Granulocytes: 0 %
Lymphocytes Relative: 31 %
Lymphs Abs: 2.1 10*3/uL (ref 0.7–4.0)
MCH: 27.2 pg (ref 26.0–34.0)
MCHC: 31.5 g/dL (ref 30.0–36.0)
MCV: 86.5 fL (ref 80.0–100.0)
Monocytes Absolute: 0.4 10*3/uL (ref 0.1–1.0)
Monocytes Relative: 6 %
Neutro Abs: 4.2 10*3/uL (ref 1.7–7.7)
Neutrophils Relative %: 60 %
Platelets: 220 10*3/uL (ref 150–400)
RBC: 4.59 MIL/uL (ref 3.87–5.11)
RDW: 15.4 % (ref 11.5–15.5)
WBC: 6.9 10*3/uL (ref 4.0–10.5)
nRBC: 0 % (ref 0.0–0.2)

## 2019-01-12 LAB — I-STAT BETA HCG BLOOD, ED (MC, WL, AP ONLY): I-stat hCG, quantitative: <5 m[IU]/mL (ref <5)

## 2019-01-12 LAB — LIPASE, BLOOD: Lipase: 23 U/L (ref 11–51)

## 2019-01-12 MED ORDER — SODIUM CHLORIDE 0.9 % IV BOLUS
1000.0000 mL | Freq: Once | INTRAVENOUS | Status: AC
  Administered 2019-01-12: 1000 mL via INTRAVENOUS

## 2019-01-12 MED ORDER — LIDOCAINE VISCOUS HCL 2 % MT SOLN
15.0000 mL | Freq: Once | OROMUCOSAL | Status: AC
  Administered 2019-01-12: 15 mL via ORAL
  Filled 2019-01-12: qty 15

## 2019-01-12 MED ORDER — ALUM & MAG HYDROXIDE-SIMETH 200-200-20 MG/5ML PO SUSP
30.0000 mL | Freq: Once | ORAL | Status: AC
  Administered 2019-01-12: 30 mL via ORAL
  Filled 2019-01-12: qty 30

## 2019-01-12 MED ORDER — PANTOPRAZOLE SODIUM 40 MG IV SOLR
40.0000 mg | Freq: Once | INTRAVENOUS | Status: AC
  Administered 2019-01-12: 40 mg via INTRAVENOUS
  Filled 2019-01-12: qty 40

## 2019-01-12 MED ORDER — ONDANSETRON HCL 4 MG/2ML IJ SOLN
4.0000 mg | Freq: Once | INTRAMUSCULAR | Status: AC
  Administered 2019-01-12: 18:00:00 4 mg via INTRAVENOUS
  Filled 2019-01-12: qty 2

## 2019-01-12 MED ORDER — PANTOPRAZOLE SODIUM 20 MG PO TBEC: 20.0000 mg | DELAYED_RELEASE_TABLET | Freq: Every day | ORAL | 0 refills | Status: DC

## 2019-01-12 MED ORDER — ONDANSETRON 4 MG PO TBDP: ORAL_TABLET | ORAL | 0 refills | Status: DC

## 2019-01-12 NOTE — ED Triage Notes (Signed)
Patient reports dental abscess and emesis since Tuesday.

## 2019-01-12 NOTE — Discharge Instructions (Addendum)
Avoid all NSAIDs including Goody powders.  You may take over-the-counter Mylanta and Maalox for upper abdominal discomfort or acid reflux.  Follow-up closely with your primary physician.  May need a referral to gastroenterologist should your symptoms persist.

## 2019-01-12 NOTE — ED Provider Notes (Signed)
[facility] EMERGENCY DEPARTMENT Provider Note   CSN: [id number] Arrival date & time: 01/12/19  1708    History   Chief Complaint Chief Complaint  Patient presents with  . Emesis    HPI Mary Jarvis is a 43 y.o. female.     HPI Patient presents with 2 days of multiple episodes of vomiting and dry heaving.  Also complains of left upper quadrant abdominal pain.  Worse after eating.  Denies melanotic or grossly bloody stools.  States she is been constipated for the past few days.  Patient states she has been taking Goody powders for chronic headaches.  She also notes ongoing dental disease and pain but has been unable to find a dentist.  No definite abscesses. Past Medical History:  Diagnosis Date  . Chronic pain syndrome   . Gastric ulcer   . Generalized anxiety disorder   . Hepatitis C   . Hyperlipidemia   . Hypertension   . Kidney stones   . Liver failure ([place])   . Peripheral neuropathy   . Tachycardia     Patient Active Problem List   Diagnosis Date Noted  . Hepatitis C 12/12/2012  . 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 KNEE PAIN, LEFT 09/29/2007    Past Surgical History:  Procedure Laterality Date  . FOOT SURGERY    . rt foot surgery    . TUBAL LIGATION       OB History    Gravida  2   Para  2   Term  2   Preterm  0   AB  0   Living        SAB  0   TAB  0   Ectopic  0   Multiple      Live Births               Home Medications    Prior to Admission medications   Medication Sig Start Date End Date Taking? Authorizing Provider  acetaminophen (TYLENOL) 325 MG tablet Take 325 mg by mouth every 6 (six) hours as needed for mild pain or moderate pain.    [provider]  albuterol (PROVENTIL HFA;VENTOLIN HFA) 108 (90 Base) MCG/ACT inhaler Inhale 1-2 puffs into the lungs every 6 (six) hours as needed for wheezing or shortness of breath.    [provider]  cyclobenzaprine (FLEXERIL) 10 MG tablet Take 1  tablet (10 mg total) by mouth 2 (two) times daily as needed for muscle spasms. 12/05/18   Dorie Rank, MD  doxycycline (VIBRAMYCIN) 100 MG capsule Take 1 capsule (100 mg total) by mouth 2 (two) times daily. One po bid x 7 days Patient not taking: Reported on 07/23/2018 06/26/18   Sherwood Gambler, MD  levETIRAcetam (KEPPRA) 500 MG tablet Take 1 tablet (500 mg total) by mouth 2 (two) times daily. 07/23/18   Daleen Bo, MD  ondansetron (ZOFRAN ODT) 4 MG disintegrating tablet 4mg  ODT q4 hours prn nausea/vomit 01/12/19   Julianne Rice, MD  pantoprazole (PROTONIX) 20 MG tablet Take 1 tablet (20 mg total) by mouth daily. 01/12/19   Julianne Rice, MD    Family History Family History  Problem Relation Age of Onset  . Heart disease Mother   . Diabetes Mother   . Peripheral Artery Disease Father     Social History Social History   Tobacco Use  . Smoking status: Current Every Day Smoker    Packs/day: 1.00    Types: Cigarettes  . Smokeless tobacco: Never Used  Substance Use Topics  . Alcohol use: Not Currently  . Drug use: Yes    Types: Marijuana    Comment: Methadone - off for 5 months     Allergies   Aspirin, Ibuprofen, Tramadol, and Ancef [cefazolin]   Review of Systems Review of Systems  Constitutional: Negative for chills and fever.  HENT: Positive for dental problem. Negative for facial swelling and sore throat.   Respiratory: Negative for shortness of breath.   Cardiovascular: Negative for chest pain.  Gastrointestinal: Positive for abdominal pain, constipation, nausea and vomiting. Negative for blood in stool.  Genitourinary: Negative for dysuria, flank pain, frequency and pelvic pain.  Musculoskeletal: Negative for back pain.  Skin: Negative for rash and wound.  Neurological: Negative for dizziness, weakness, light-headedness, numbness and headaches.  All other systems reviewed and are negative.    Physical Exam Updated Vital Signs BP (!) 131/99 (BP Location:  Left Arm)   Pulse (!) 105   Temp 98 F (36.7 C) (Oral)   Resp 20   SpO2 95%   Physical Exam Vitals signs and nursing note reviewed.  Constitutional:      Appearance: Normal appearance. She is well-developed.  HENT:     Head: Normocephalic and atraumatic.     Comments: Very poor dentition with multiple fractured teeth.  No obvious abscesses or oral floor swelling.  Oropharynx is clear.    Mouth/Throat:     Mouth: Mucous membranes are moist.  Eyes:     Extraocular Movements: Extraocular movements intact.     Pupils: Pupils are equal, round, and reactive to light.  Neck:     Musculoskeletal: Normal range of motion and neck supple.  Cardiovascular:     Rate and Rhythm: Regular rhythm. Tachycardia present.     Heart sounds: No murmur. No friction rub. No gallop.   Pulmonary:     Effort: Pulmonary effort is normal. No respiratory distress.     Breath sounds: Normal breath sounds. No stridor. No wheezing, rhonchi or rales.  Chest:     Chest wall: No tenderness.  Abdominal:     General: Bowel sounds are normal.     Palpations: Abdomen is soft.     Tenderness: There is abdominal tenderness. There is no right CVA tenderness, left CVA tenderness, guarding or rebound.     Comments: Mild left upper quadrant tenderness to palpation without rebound or guarding.  Musculoskeletal: Normal range of motion.        General: No swelling, tenderness, deformity or signs of injury.     Right lower leg: No edema.  Skin:    General: Skin is warm and dry.     Findings: No erythema or rash.  Neurological:     General: No focal deficit present.     Mental Status: She is alert and oriented to person, place, and time.  Psychiatric:        Behavior: Behavior normal.      ED Treatments / Results  Labs (all labs ordered are listed, but only abnormal results are displayed) Labs Reviewed  COMPREHENSIVE METABOLIC PANEL - Abnormal; Notable for the following components:      Result Value   Calcium 8.8  (*)    All other components within normal limits  CBC WITH DIFFERENTIAL/PLATELET  LIPASE, BLOOD  I-STAT BETA HCG BLOOD, ED ([facility], [facility], [facility] ONLY)    EKG None  Radiology No results found.  Procedures Procedures (including critical care time)  Medications Ordered in ED Medications  pantoprazole (PROTONIX)  injection 40 mg (40 mg Intravenous Given 01/12/19 1807)  ondansetron (ZOFRAN) injection 4 mg (4 mg Intravenous Given 01/12/19 1807)  sodium chloride 0.9 % bolus 1,000 mL (0 mLs Intravenous Stopped 01/12/19 1907)  alum & mag hydroxide-simeth (MAALOX/MYLANTA) 200-200-20 MG/5ML suspension 30 mL (30 mLs Oral Given 01/12/19 1907)    And  lidocaine (XYLOCAINE) 2 % viscous mouth solution 15 mL (15 mLs Oral Given 01/12/19 1907)     Initial Impression / Assessment and Plan / ED Course  I have reviewed the triage vital signs and the nursing notes.  Pertinent labs & imaging results that were available during my care of the patient were reviewed by me and considered in my medical decision making (see chart for details).        Suspect acute exacerbation of patient's gastritis likely due to recent NSAID use.  No obvious dental abscesses.  Reemphasized patient's need to follow-up with a dentist.  Patient states he is feeling much better after medications and fluids.  States he is been out of her Protonix for several weeks now.  Has an appointment to follow-up with her primary physician.  Will give refill of her Protonix and prescription for Zofran.  Advised over-the-counter Mylanta and Maalox as needed.  She also understands the need for dental follow-up.  Return precautions given. Final Clinical Impressions(s) / ED Diagnoses   Final diagnoses:  NSAID induced gastritis    ED Discharge Orders         Ordered    pantoprazole (PROTONIX) 20 MG tablet  Daily     01/12/19 1922    ondansetron (ZOFRAN ODT) 4 MG disintegrating tablet     01/12/19 Gaspar [place]a1922           Ivannia Willhelm, MD 01/12/19  1923

## 2019-02-18 ENCOUNTER — Other Ambulatory Visit: Payer: Self-pay

## 2019-02-18 ENCOUNTER — Encounter (HOSPITAL_COMMUNITY): Payer: Self-pay

## 2019-02-18 ENCOUNTER — Emergency Department (HOSPITAL_COMMUNITY)
Admission: EM | Admit: 2019-02-18 | Discharge: 2019-02-18 | Disposition: A | Discharge status: Home / Self Care | Payer: Self-pay | Attending: Emergency Medicine | Admitting: Emergency Medicine

## 2019-02-18 DIAGNOSIS — Z885 Allergy status to narcotic agent status: Secondary | ICD-10-CM | POA: Insufficient documentation

## 2019-02-18 DIAGNOSIS — Z79899 Other long term (current) drug therapy: Secondary | ICD-10-CM | POA: Insufficient documentation

## 2019-02-18 DIAGNOSIS — Z881 Allergy status to other antibiotic agents status: Secondary | ICD-10-CM | POA: Insufficient documentation

## 2019-02-18 DIAGNOSIS — R202 Paresthesia of skin: Secondary | ICD-10-CM | POA: Insufficient documentation

## 2019-02-18 DIAGNOSIS — I10 Essential (primary) hypertension: Secondary | ICD-10-CM | POA: Insufficient documentation

## 2019-02-18 DIAGNOSIS — Z886 Allergy status to analgesic agent status: Secondary | ICD-10-CM | POA: Insufficient documentation

## 2019-02-18 NOTE — ED Notes (Signed)
Bilateral wrist splints place. Pt ambulatory out of ED.

## 2019-02-18 NOTE — ED Triage Notes (Signed)
Pt reports multiple episodes of tingling to numbness to both arms and hands, states she notices it mostly with increased use like while at work.  Pt also has some episodes with trying to write.

## 2019-02-18 NOTE — ED Provider Notes (Signed)
[facility]ERGENCY DEPARTMENT Provider Note   CSN: [id number] Arrival date & time: 02/18/19  0025    History   Chief Complaint Chief Complaint  Patient presents with  . Numbness    tingling    HPI Mary Jarvis is a 43 y.o. female.     Patient presents with complaints of intermittent episodes of weakness with numbness and tingling of both of her upper extremities.  Symptoms have been ongoing for at least 6 months.  She has noticed an increase frequency over the last several weeks.  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  She had to leave work because of this.  Patient reports that symptoms are often related to exertion.  Usually when she is at work or doing something at home where she is using her arms precipitates the symptoms.  She reports pins-and-needles type sensation with weakness of both of her hands when this occurs.  She has trouble holding onto objects when it occurs, often dropping them.     Past Medical History:  Diagnosis Date  . Chronic pain syndrome   . Gastric ulcer   . Generalized anxiety disorder   . Hepatitis C   . Hyperlipidemia   . Hypertension   . Kidney stones   . Liver failure (HCC)   . Peripheral neuropathy   . Tachycardia     Patient Active Problem List   Diagnosis Date Noted  . Hepatitis C 12/12/2012  . 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 KNEE PAIN, LEFT 09/29/2007    Past Surgical History:  Procedure Laterality Date  . FOOT SURGERY    . rt foot surgery    . TUBAL LIGATION       OB History    Gravida  2   Para  2   Term  2   Preterm  0   AB  0   Living        SAB  0   TAB  0   Ectopic  0   Multiple      Live Births               Home Medications    Prior to Admission medications   Medication Sig Start Date End Date Taking? Authorizing Provider  acetaminophen (TYLENOL) 325 MG tablet Take  325 mg by mouth every 6 (six) hours as needed for mild pain or moderate pain.    [provider]  albuterol (PROVENTIL HFA;VENTOLIN HFA) 108 (90 Base) MCG/ACT inhaler Inhale 1-2 puffs into the lungs every 6 (six) hours as needed for wheezing or shortness of breath.    [provider]  cyclobenzaprine (FLEXERIL) 10 MG tablet Take 1 tablet (10 mg total) by mouth 2 (two) times daily as needed for muscle spasms. 12/05/18   Linwood DibblesKnapp, Jon, MD  doxycycline (VIBRAMYCIN) 100 MG capsule Take 1 capsule (100 mg total) by mouth 2 (two) times daily. One po bid x 7 days Patient not taking: Reported on 07/23/2018 06/26/18   Pricilla LovelessGoldston, Scott, MD  levETIRAcetam (KEPPRA) 500 MG tablet Take 1 tablet (500 mg total) by mouth 2 (two) times daily. 07/23/18   Mancel BaleWentz, Elliott, MD  ondansetron (ZOFRAN ODT) 4 MG disintegrating tablet 4mg  ODT q4 hours prn nausea/vomit 01/12/19   Loren RacerYelverton, David, MD  pantoprazole (PROTONIX) 20 MG tablet Take 1 tablet (20 mg total) by mouth daily. 01/12/19  Loren RacerYelverton, David, MD    Family History Family History  Problem Relation Age of Onset  . Heart disease Mother   . Diabetes Mother   . Peripheral Artery Disease Father     Social History Social History   Tobacco Use  . Smoking status: Current Every Day Smoker    Packs/day: 1.00    Types: Cigarettes  . Smokeless tobacco: Never Used  Substance Use Topics  . Alcohol use: Not Currently  . Drug use: Yes    Types: Marijuana    Comment: Methadone - off for 5 months     Allergies   Aspirin, Ibuprofen, Tramadol, and Ancef [cefazolin]   Review of Systems Review of Systems  Neurological: Positive for weakness and numbness.     Physical Exam Updated Vital Signs BP 119/89 (BP Location: Left Arm)   Pulse 78   Temp 98.4 F (36.9 C) (Oral)   Resp 16   LMP 01/15/2019 (Approximate)   SpO2 96%   Physical Exam Constitutional:      Appearance: Normal appearance.  HENT:     Head: Normocephalic and atraumatic.   Neck:     Musculoskeletal: Normal range of motion and neck supple. Normal range of motion. No neck rigidity, pain with movement, spinous process tenderness or muscular tenderness.  Cardiovascular:     Rate and Rhythm: Normal rate and regular rhythm.     Pulses:          Radial pulses are 2+ on the right side and 2+ on the left side.  Pulmonary:     Effort: Pulmonary effort is normal.     Breath sounds: Normal breath sounds.  Musculoskeletal:     Right shoulder: Normal.     Left shoulder: Normal.     Comments: + tinnel bilaterally + phalen left  Hypothenar atrophy bilaterally  Skin:    General: Skin is warm and dry.     Capillary Refill: Capillary refill takes less than 2 seconds.     Findings: No rash.  Neurological:     Mental Status: She is alert.     Cranial Nerves: Cranial nerves are intact.     Sensory: Sensation is intact.     Motor: Motor function is intact.      ED Treatments / Results  Labs (all labs ordered are listed, but only abnormal results are displayed) Labs Reviewed - No data to display  EKG None  Radiology No results found.  Procedures Procedures (including critical care time)  Medications Ordered in ED Medications - No data to display   Initial Impression / Assessment and Plan / ED Course  I have reviewed the triage vital signs and the nursing notes.  Pertinent labs & imaging results that were available during my care of the patient were reviewed by me and considered in my medical decision making (see chart for details).        Patient presents with intermittent episodes of numbness, tingling, paresthesias and weakness of both of her hands.  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  Symptoms occur bilaterally at times, sometimes unilaterally but can be on either side.  This is therefore not consistent with  CNS problems such as stroke or TIA.  This appears to be a peripheral problem, no sign of vascular abnormality.  She has good pulses and no swelling.  Suspect peripheral  nerve impingement such as carpal tunnel or cubital tunnel syndromes.  Will refer to neurology for definitive testing.  Brace wrists, work note, NSAIDs.  Final Clinical Impressions(s) / ED Diagnoses   Final diagnoses:  Paresthesia    ED Discharge Orders    None       Terry Abila, Gwenyth Allegra, MD 02/18/19 0145

## 2019-02-28 ENCOUNTER — Emergency Department (HOSPITAL_COMMUNITY)
Admission: EM | Admit: 2019-02-28 | Discharge: 2019-02-28 | Disposition: A | Discharge status: Home / Self Care | Payer: Self-pay | Attending: Emergency Medicine | Admitting: Emergency Medicine

## 2019-02-28 ENCOUNTER — Other Ambulatory Visit: Payer: Self-pay

## 2019-02-28 ENCOUNTER — Encounter (HOSPITAL_COMMUNITY): Payer: Self-pay | Admitting: *Deleted

## 2019-02-28 ENCOUNTER — Emergency Department (HOSPITAL_COMMUNITY): Payer: Self-pay

## 2019-02-28 DIAGNOSIS — F17210 Nicotine dependence, cigarettes, uncomplicated: Secondary | ICD-10-CM | POA: Insufficient documentation

## 2019-02-28 DIAGNOSIS — J45901 Unspecified asthma with (acute) exacerbation: Secondary | ICD-10-CM | POA: Insufficient documentation

## 2019-02-28 DIAGNOSIS — Z79899 Other long term (current) drug therapy: Secondary | ICD-10-CM | POA: Insufficient documentation

## 2019-02-28 DIAGNOSIS — I10 Essential (primary) hypertension: Secondary | ICD-10-CM | POA: Insufficient documentation

## 2019-02-28 MED ORDER — METHYLPREDNISOLONE SODIUM SUCC 125 MG IJ SOLR
125.0000 mg | Freq: Once | INTRAMUSCULAR | Status: AC
  Administered 2019-02-28: 125 mg via INTRAMUSCULAR
  Filled 2019-02-28: qty 2

## 2019-02-28 MED ORDER — PREDNISONE 20 MG PO TABS: 40.0000 mg | ORAL_TABLET | Freq: Every day | ORAL | 0 refills | Status: AC

## 2019-02-28 MED ORDER — ALBUTEROL SULFATE HFA 108 (90 BASE) MCG/ACT IN AERS: 1.0000 | INHALATION_SPRAY | Freq: Four times a day (QID) | RESPIRATORY_TRACT | 2 refills | Status: AC | PRN

## 2019-02-28 MED ORDER — METHYLPREDNISOLONE SODIUM SUCC 125 MG IJ SOLR: 125.0000 mg | Freq: Once | INTRAMUSCULAR | Status: DC

## 2019-02-28 MED ORDER — ALBUTEROL SULFATE HFA 108 (90 BASE) MCG/ACT IN AERS
6.0000 | INHALATION_SPRAY | Freq: Once | RESPIRATORY_TRACT | Status: AC
  Administered 2019-02-28: 18:00:00 6 via RESPIRATORY_TRACT
  Filled 2019-02-28: qty 6.7

## 2019-02-28 MED ORDER — ALBUTEROL SULFATE (2.5 MG/3ML) 0.083% IN NEBU: 2.5000 mg | INHALATION_SOLUTION | Freq: Four times a day (QID) | RESPIRATORY_TRACT | 12 refills | Status: DC | PRN

## 2019-02-28 NOTE — ED Provider Notes (Signed)
[facility] EMERGENCY DEPARTMENT Provider Note   CSN: [id number] Arrival date & time: 02/28/19  1442    History   Chief Complaint Chief Complaint  Patient presents with   Shortness of Breath    HPI Mary Jarvis is a 43 y.o. female past medical history of chronic pain syndrome, hepatitis C, hyperlipidemia, hypertension, asthma who presents for evaluation of shortness of breath, wheezing.  She reports is been ongoing issue for the last several weeks.  She has been seen by her doctor and is currently prescribed albuterol inhalers.  She reports that she was going to be evaluated for COPD but her next appointment is on 03/16/19.  She states that over the last week or so, she has had progressively worsening shortness of breath and wheezing.  She states that her inhalers are helping minimally.  She reports that symptoms worsened today about 2 AM.  She woke up and felt like she was having an acute asthma attack.  She was wheezing and coughing.  She used her inhaler with minimal improvement.  She states that she came in today because she feels like symptoms are continued.  She reports associated cough that is productive of yellow sputum.  She denies any fevers.  Denies any recent travel, known COVID-19 exposure.  She is a current smoker and smokes about half a pack of cigarettes which is down from 2 packs.  Patient reports some associated chest tightness.  She states that this feels similar to her previous asthma episodes.  Patient denies any abdominal pain, vomiting.     The history is provided by the patient.    Past Medical History:  Diagnosis Date   Chronic pain syndrome    Gastric ulcer    Generalized anxiety disorder    Hepatitis C    Hyperlipidemia    Hypertension    Kidney stones    Liver failure (HCC)    Peripheral neuropathy    Tachycardia     Patient Active Problem List   Diagnosis Date Noted   Hepatitis C 12/12/2012   SUBLUXATION PATELLAR (MALALIGNMENT)  09/29/2007   KNEE PAIN, LEFT 09/29/2007    Past Surgical History:  Procedure Laterality Date   FOOT SURGERY     rt foot surgery     TUBAL LIGATION       OB History    Gravida  2   Para  2   Term  2   Preterm  0   AB  0   Living        SAB  0   TAB  0   Ectopic  0   Multiple      Live Births               Home Medications    Prior to Admission medications   Medication Sig Start Date End Date Taking? Authorizing Provider  levETIRAcetam (KEPPRA) 500 MG tablet Take 1 tablet (500 mg total) by mouth 2 (two) times daily. Patient taking differently: Take 1,000 mg by mouth 2 (two) times daily.  07/23/18  Yes Mancel BaleWentz, Elliott, MD  albuterol (PROVENTIL) (2.5 MG/3ML) 0.083% nebulizer solution Take 3 mLs (2.5 mg total) by nebulization every 6 (six) hours as needed for wheezing or shortness of breath. 02/28/19   Maxwell CaulLayden, Josee Speece A, PA-C  albuterol (VENTOLIN HFA) 108 (90 Base) MCG/ACT inhaler Inhale 1-2 puffs into the lungs every 6 (six) hours as needed for wheezing or shortness of breath. 02/28/19   Maxwell CaulLayden, Julian Medina A, PA-C  pantoprazole (PROTONIX) 20 MG tablet Take 1 tablet (20 mg total) by mouth daily. Patient not taking: Reported on 02/28/2019 01/12/19   Julianne Rice, MD  predniSONE (DELTASONE) 20 MG tablet Take 2 tablets (40 mg total) by mouth daily for 4 days. 02/28/19 03/04/19  Volanda Napoleon, PA-C    Family History Family History  Problem Relation Age of Onset   Heart disease Mother    Diabetes Mother    Peripheral Artery Disease Father     Social History Social History   Tobacco Use   Smoking status: Current Every Day Smoker    Packs/day: 1.00    Types: Cigarettes   Smokeless tobacco: Never Used  Substance Use Topics   Alcohol use: Not Currently   Drug use: Yes    Types: Marijuana    Comment: Methadone - off for almost a year per pt     Allergies   Aspirin, Ibuprofen, Tramadol, and Ancef [cefazolin]   Review of Systems Review of Systems    Constitutional: Negative for fever.  Respiratory: Positive for cough, chest tightness, shortness of breath and wheezing.   Cardiovascular: Negative for chest pain.  Gastrointestinal: Negative for abdominal pain, nausea and vomiting.  Neurological: Negative for headaches.  All other systems reviewed and are negative.    Physical Exam Updated Vital Signs BP 110/85 (BP Location: Right Arm)    Pulse 72    Temp 98 F (36.7 C) (Oral)    Resp 17    Ht 5\' 2"  (1.575 m)    Wt 88 kg    LMP 01/30/2019    SpO2 94%    BMI 35.48 kg/m   Physical Exam Vitals signs and nursing note reviewed.  Constitutional:      Appearance: Normal appearance. She is well-developed.  HENT:     Head: Normocephalic and atraumatic.  Eyes:     General: Lids are normal.     Conjunctiva/sclera: Conjunctivae normal.     Pupils: Pupils are equal, round, and reactive to light.  Neck:     Musculoskeletal: Full passive range of motion without pain.  Cardiovascular:     Rate and Rhythm: Normal rate and regular rhythm.     Pulses: Normal pulses.     Heart sounds: Normal heart sounds. No murmur. No friction rub. No gallop.   Pulmonary:     Effort: Pulmonary effort is normal.     Breath sounds: Wheezing present.     Comments: Diffuse wheezing noted through all lung fields.  Speaking in full sentences without any difficulty.  No evidence of respiratory distress. Abdominal:     Palpations: Abdomen is soft. Abdomen is not rigid.     Tenderness: There is no abdominal tenderness. There is no guarding.  Musculoskeletal: Normal range of motion.  Skin:    General: Skin is warm and dry.     Capillary Refill: Capillary refill takes less than 2 seconds.  Neurological:     Mental Status: She is alert and oriented to person, place, and time.  Psychiatric:        Speech: Speech normal.      ED Treatments / Results  Labs (all labs ordered are listed, but only abnormal results are displayed) Labs Reviewed - No data to  display  EKG None  Radiology Dg Chest 2 View  Result Date: 02/28/2019 CLINICAL DATA:  Shortness of breath EXAM: CHEST - 2 VIEW COMPARISON:  May 05, 2018 FINDINGS: Lungs are clear. Heart size and pulmonary vascularity are normal. No  adenopathy. No bone lesions. IMPRESSION: No edema or consolidation. Electronically Signed   By: Bretta BangWilliam  Woodruff III M.D.   On: 02/28/2019 15:29    Procedures Procedures (including critical care time)  Medications Ordered in ED Medications  albuterol (VENTOLIN HFA) 108 (90 Base) MCG/ACT inhaler 6 puff (6 puffs Inhalation Given 02/28/19 1805)  methylPREDNISolone sodium succinate (SOLU-MEDROL) 125 mg/2 mL injection 125 mg (125 mg Intramuscular Given 02/28/19 1805)     Initial Impression / Assessment and Plan / ED Course  I have reviewed the triage vital signs and the nursing notes.  Pertinent labs & imaging results that were available during my care of the patient were reviewed by me and considered in my medical decision making (see chart for details).        43 year old female past history of asthma, hepatitis C, hyperlipidemia who presents for evaluation of shortness of breath, wheezing, coughing that is been ongoing for the last several days.  Patient states that she has been using albuterol inhaler with no improvements.  Also reports a cough productive of yellow sputum.  No fevers.  Had an asthma exacerbation earlier this morning and felt like she has continued to wheeze.  States this feels similar asthma exacerbations. Patient is afebrile, non-toxic appearing, sitting comfortably on examination table. Vital signs reviewed and stable.  On exam, she is wheezing diffusely throughout all lung fields.  She is able to speak in full sentences without difficulty.  No evidence of respiratory distress.  Concern for asthma exacerbation.  Also concern for infectious etiology.  Plan to check chest x-ray and give breathing treatments.  Chest x-ray reviewed.  Negative  for any acute infectious process.  Reevaluation after steroids, albuterol.  Patient with significantly improved lung sounds.  Wheezing was completely resolved.  O2 sats are stable.  She fluctuates between 90 to 96%.  Patient is sitting up and looks much better.  She states she feels better.  I offered her additional breathing treatment here in the ED but she declined.  She states she feels good enough to go home.  We will send home on a short course of steroids, refill of albuterol inhaler, albuterol nebulizer.  Instructed patient to follow-up with her primary care doctor. At this time, patient exhibits no emergent life-threatening condition that require further evaluation in ED or admission. Patient had ample opportunity for questions and discussion. All patient's questions were answered with full understanding. Strict return precautions discussed. Patient expresses understanding and agreement to plan.   Portions of this note were generated with Scientist, clinical (histocompatibility and immunogenetics)Dragon dictation software. Dictation errors may occur despite best attempts at proofreading.  Final Clinical Impressions(s) / ED Diagnoses   Final diagnoses:  Exacerbation of asthma, unspecified asthma severity, unspecified whether persistent    ED Discharge Orders         Ordered    For home use only DME Nebulizer machine     02/28/19 1934    albuterol (VENTOLIN HFA) 108 (90 Base) MCG/ACT inhaler  Every 6 hours PRN     02/28/19 1934    albuterol (PROVENTIL) (2.5 MG/3ML) 0.083% nebulizer solution  Every 6 hours PRN     02/28/19 1934    predniSONE (DELTASONE) 20 MG tablet  Daily     02/28/19 1934           Rosana HoesLayden, Lamae Fosco A, PA-C 02/28/19 2117    Bethann BerkshireZammit, Joseph, MD 03/03/19 [phone]

## 2019-02-28 NOTE — ED Triage Notes (Signed)
Pt with sob, nausea as well. Has been using her inhalers.  Pt with hx with asthma, pt is to have tests done on the 20th to check for COPD.  Pt states she is required to wear masks at work and pt states she can't keeping wearing the masks, that it is making it worse for her.

## 2019-02-28 NOTE — Discharge Instructions (Signed)
Take prednisone as directed.  Use albuterol inhaler as directed.  As we discussed, I written you for an albuterol nebulizer machine and solution.  Follow-up with your primary care doctor.  Return the emergency department for any difficulty breathing, fevers or any other worsening or concerning symptoms.

## 2019-09-18 ENCOUNTER — Emergency Department (HOSPITAL_COMMUNITY): Payer: Self-pay

## 2019-09-18 ENCOUNTER — Other Ambulatory Visit: Payer: Self-pay

## 2019-09-18 ENCOUNTER — Emergency Department (HOSPITAL_COMMUNITY)
Admission: EM | Admit: 2019-09-18 | Discharge: 2019-09-18 | Disposition: A | Discharge status: Home / Self Care | Payer: Self-pay | Attending: Emergency Medicine | Admitting: Emergency Medicine

## 2019-09-18 ENCOUNTER — Encounter (HOSPITAL_COMMUNITY): Payer: Self-pay

## 2019-09-18 DIAGNOSIS — Z978 Presence of other specified devices: Secondary | ICD-10-CM

## 2019-09-18 DIAGNOSIS — M79671 Pain in right foot: Secondary | ICD-10-CM

## 2019-09-18 DIAGNOSIS — I10 Essential (primary) hypertension: Secondary | ICD-10-CM | POA: Insufficient documentation

## 2019-09-18 DIAGNOSIS — Z79899 Other long term (current) drug therapy: Secondary | ICD-10-CM | POA: Insufficient documentation

## 2019-09-18 DIAGNOSIS — F17210 Nicotine dependence, cigarettes, uncomplicated: Secondary | ICD-10-CM | POA: Insufficient documentation

## 2019-09-18 NOTE — ED Provider Notes (Signed)
[facility] EMERGENCY DEPARTMENT Provider Note   CSN: [id number] Arrival date & time: 09/18/19  1008     History Chief Complaint  Patient presents with  . Foot Pain    Mary Jarvis is a 44 y.o. female presenting with pain and perceived movement of a residual operative screw in her right foot.  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  She describes new pain and can feel a screw head between her right 4th and 5th toes which is new over the past several days.  She denies new injury.  Her surgeon is no longer available (deceased) and has seen a podiatrist in the [place] area in the distant past but not currently active with this provider.  States has only been able to see specialist when referred by her pcp.  Works in a position requiring walking and long periods of standing. Requests work note until can get referral for f/u care by pcp.   The history is provided by the patient.       Past Medical History:  Diagnosis Date  . Chronic pain syndrome   . Gastric ulcer   . Generalized anxiety disorder   . Hepatitis C   . Hyperlipidemia   . Hypertension   . Kidney stones   . Liver failure (HCC)   . Peripheral neuropathy   . Tachycardia     Patient Active Problem List   Diagnosis Date Noted  . Hepatitis C 12/12/2012  . 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 KNEE PAIN, LEFT 09/29/2007    Past Surgical History:  Procedure Laterality Date  . FOOT SURGERY    . rt foot surgery    . TUBAL LIGATION       OB History    Gravida  2   Para  2   Term  2   Preterm  0   AB  0   Living        SAB  0   TAB  0   Ectopic  0   Multiple      Live Births              Family History  Problem Relation Age of Onset  . Heart disease Mother   . Diabetes Mother   . Peripheral Artery Disease Father      Social History   Tobacco Use  . Smoking status: Current Every Day Smoker    Packs/day: 1.00    Types: Cigarettes  . Smokeless tobacco: Never Used  Substance Use Topics  . Alcohol use: Not Currently  . Drug use: Yes    Types: Marijuana    Comment: Methadone - off for 2 years    Home Medications Prior to Admission medications   Medication Sig Start Date End Date Taking? Authorizing Provider  albuterol (PROVENTIL) (2.5 MG/3ML) 0.083% nebulizer solution Take 3 mLs (2.5 mg total) by nebulization every 6 (six) hours as needed for wheezing or shortness of breath. 02/28/19   Maxwell Caul, PA-C  albuterol (VENTOLIN HFA) 108 (90 Base) MCG/ACT inhaler Inhale 1-2 puffs into the lungs every 6 (six) hours as needed for wheezing or shortness of breath. 02/28/19   Maxwell Caul, PA-C  levETIRAcetam (KEPPRA) 500 MG tablet Take 1 tablet (500 mg total) by mouth 2 (two) times daily. Patient taking differently: Take 1,000 mg by mouth 2 (two)  times daily.  07/23/18   Mancel Bale, MD  pantoprazole (PROTONIX) 20 MG tablet Take 1 tablet (20 mg total) by mouth daily. Patient not taking: Reported on 02/28/2019 01/12/19   Loren Racer, MD    Allergies    Aspirin, Ibuprofen, Tramadol, and Ancef [cefazolin]  Review of Systems   Review of Systems  Constitutional: Negative for fever.  Musculoskeletal: Positive for arthralgias. Negative for joint swelling and myalgias.  Neurological: Negative for weakness and numbness.    Physical Exam Updated Vital Signs BP 112/78 (BP Location: Right Arm)   Pulse 68   Temp 97.8 F (36.6 C) (Oral)   Resp 18   Ht 5' (1.524 m)   Wt 83.5 kg   LMP 09/06/2019   SpO2 99%   BMI 35.94 kg/m   Physical Exam HENT:     Head: Normocephalic.  Cardiovascular:     Rate and Rhythm: Normal rate.     Pulses: Normal pulses.  Pulmonary:     Effort: Pulmonary effort is normal.  Musculoskeletal:        General: Tenderness present.     Right foot: Normal capillary  refill. Swelling and tenderness present. Normal pulse.     Comments: Firm palpable nodule in the toe web between the right 4th and fifth toes. Digits without deformity. Distal sensation intact. Less than 2 sec cap refill in toes.  Dorsalis pedal pulse full.  Skin:    General: Skin is warm and dry.     Findings: No lesion.     Comments: No edema or erythema.  No skin lesion.   Neurological:     General: No focal deficit present.     Mental Status: She is alert.     ED Results / Procedures / Treatments   Labs (all labs ordered are listed, but only abnormal results are displayed) Labs Reviewed - No data to display  EKG None  Radiology DG Foot Complete Right  Result Date: 09/18/2019 CLINICAL DATA:  Hardware failure. Post reconstructive surgery of the right foot EXAM: RIGHT FOOT COMPLETE - 3+ VIEW COMPARISON:  None. FINDINGS: A screw passes obliquely through the head of the fourth metatarsal. It may be slightly prominent along the dorsal margin and along the plantar surface. There is destruction of the head of the fifth metatarsal with chronic deformity in the proximal phalanx of the fourth and fifth ray. There is no visible soft tissue swelling. Signs of midfoot and ankle degenerative change are noted in addition of post-traumatic and degenerative changes in the forefoot described above. IMPRESSION: Postsurgical and posttraumatic changes as described above. No acute abnormality is identified. Chronic appearing destructive changes about the fifth metatarsal and proximal phalanx of fourth and fifth ray could also be due to prior infection, correlate with any clinical evidence of ongoing infection. Mild prominence of a single screw in the head of the fourth metatarsal. There are no prior images available for review. Electronically Signed   By: Donzetta Kohut M.D.   On: 09/18/2019 11:15    Procedures Procedures (including critical care time)  Medications Ordered in ED Medications - No data to  display  ED Course  I have reviewed the triage vital signs and the nursing notes.  Pertinent labs & imaging results that were available during my care of the patient were reviewed by me and considered in my medical decision making (see chart for details).    MDM Rules/Calculators/A&P  Imaging reviewed and discussed with pt who also viewed films. She concurs her images are similar to prior xrays, except the screw looks different.  Given exam findings, I agree that the screw has probably moved.  No exam findings or hx to suggest current infection in the foot or toes.  She was placed in a post op shoe for prevent flex/ext of the toes.  Advised f/u with ortho, referral given for Dr. Aline Brochure. Pt states will probably need her pcp to arrange referral - she plans to contact her today for this.  Work note given.  Final Clinical Impression(s) / ED Diagnoses Final diagnoses:  Foot pain, right  Orthopedic hardware present    Rx / DC Orders ED Discharge Orders    None       Landis Martins 09/18/19 2147    Ezequiel Essex, MD 09/19/19 [phone]

## 2019-09-18 NOTE — Discharge Instructions (Addendum)
Wear the post op shoe to prevent movement/flexing of your toes when walking.  Follow up as discussed with either podiatry or general orthopedics.

## 2019-09-18 NOTE — ED Triage Notes (Signed)
Pt reports had reconstructive surgery to r foot after a traumatic injury years ago.  Reports screw is coming through the skin between fourth and fifth toe.

## 2019-12-15 ENCOUNTER — Other Ambulatory Visit: Payer: Self-pay

## 2019-12-15 ENCOUNTER — Encounter (HOSPITAL_COMMUNITY): Payer: Self-pay | Admitting: *Deleted

## 2019-12-15 ENCOUNTER — Emergency Department (HOSPITAL_COMMUNITY)
Admission: EM | Admit: 2019-12-15 | Discharge: 2019-12-15 | Disposition: A | Discharge status: Home / Self Care | Payer: Self-pay | Attending: Emergency Medicine | Admitting: Emergency Medicine

## 2019-12-15 DIAGNOSIS — L03114 Cellulitis of left upper limb: Secondary | ICD-10-CM | POA: Insufficient documentation

## 2019-12-15 DIAGNOSIS — F17210 Nicotine dependence, cigarettes, uncomplicated: Secondary | ICD-10-CM | POA: Insufficient documentation

## 2019-12-15 DIAGNOSIS — I10 Essential (primary) hypertension: Secondary | ICD-10-CM | POA: Insufficient documentation

## 2019-12-15 MED ORDER — SULFAMETHOXAZOLE-TRIMETHOPRIM 800-160 MG PO TABS
1.0000 | ORAL_TABLET | Freq: Once | ORAL | Status: AC
  Administered 2019-12-15: 1 via ORAL
  Filled 2019-12-15: qty 1

## 2019-12-15 MED ORDER — SULFAMETHOXAZOLE-TRIMETHOPRIM 800-160 MG PO TABS: 1.0000 | ORAL_TABLET | Freq: Two times a day (BID) | ORAL | 0 refills | Status: AC

## 2019-12-15 NOTE — ED Provider Notes (Signed)
[facility] EMERGENCY DEPARTMENT Provider Note   CSN: [id number] Arrival date & time: 12/15/19  0747     History Chief Complaint  Patient presents with  . Abscess    left arm    Mary Jarvis is a 44 y.o. female.  HPI    Patient presents with concern of a wound on her left forearm. She notes that she is generally well, denies substantial medical problems.  She was in her usual state of health until 2 days ago, when after working her garden she noticed a focal area of erythema on the left dorsal proximal forearm.  Since that time she has had some nausea, some subjective fever, and increasing discomfort, described as pressure-like sensation in the arm. No distal loss of sensation, weakness, no vomiting, no other complaints. No relief with OTC medication.  She hypothesizes that while working in her garden, she may have been bitten by either a spider or another insect.  Past Medical History:  Diagnosis Date  . Chronic pain syndrome   . Gastric ulcer   . Generalized anxiety disorder   . Hepatitis C   . Hyperlipidemia   . Hypertension   . Kidney stones   . Liver failure (HCC)   . Peripheral neuropathy   . Tachycardia     Patient Active Problem List   Diagnosis Date Noted  . Hepatitis C 12/12/2012  . 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 KNEE PAIN, LEFT 09/29/2007    Past Surgical History:  Procedure Laterality Date  . FOOT SURGERY    . rt foot surgery    . TUBAL LIGATION       OB History    Gravida  2   Para  2   Term  2   Preterm  0   AB  0   Living        SAB  0   TAB  0   Ectopic  0   Multiple      Live Births              Family History  Problem Relation Age of Onset  . Heart disease Mother   . Diabetes Mother   . Peripheral Artery Disease Father     Social History   Tobacco Use  . Smoking status: Current Every Day Smoker    Packs/day: 1.00    Types: Cigarettes  . Smokeless tobacco: Never Used  Substance Use  Topics  . Alcohol use: Not Currently  . Drug use: Yes    Types: Marijuana    Comment: Methadone - off for 2 years    Home Medications Prior to Admission medications   Medication Sig Start Date End Date Taking? Authorizing Provider  albuterol (PROVENTIL) (2.5 MG/3ML) 0.083% nebulizer solution Take 3 mLs (2.5 mg total) by nebulization every 6 (six) hours as needed for wheezing or shortness of breath. 02/28/19   Maxwell Caul, PA-C  albuterol (VENTOLIN HFA) 108 (90 Base) MCG/ACT inhaler Inhale 1-2 puffs into the lungs every 6 (six) hours as needed for wheezing or shortness of breath. 02/28/19   Maxwell Caul, PA-C  levETIRAcetam (KEPPRA) 500 MG tablet Take 1 tablet (500 mg total) by mouth 2 (two) times daily. Patient taking differently: Take 1,000 mg by mouth 2 (two) times daily.  07/23/18   Mancel Bale, MD  pantoprazole (PROTONIX) 20 MG tablet Take 1 tablet (20 mg total) by mouth daily. Patient not taking: Reported on 02/28/2019 01/12/19   Loren Racer, MD  sulfamethoxazole-trimethoprim (BACTRIM DS) 800-160 MG tablet Take 1 tablet by mouth 2 (two) times daily for 7 days. 12/15/19 12/22/19  Carmin Muskrat, MD    Allergies    Aspirin, Ibuprofen, Tramadol, and Ancef [cefazolin]  Review of Systems   Review of Systems  Constitutional:       Per HPI, otherwise negative  HENT:       Per HPI, otherwise negative  Respiratory:       Per HPI, otherwise negative  Cardiovascular:       Per HPI, otherwise negative  Gastrointestinal: Negative for vomiting.  Endocrine:       Negative aside from HPI  Genitourinary:       Neg aside from HPI   Musculoskeletal:       Per HPI, otherwise negative  Skin: Positive for wound.  Allergic/Immunologic: Negative for immunocompromised state.  Neurological: Negative for syncope.    Physical Exam Updated Vital Signs BP 130/76 (BP Location: Right Arm)   Pulse 88   Temp 98.7 F (37.1 C) (Oral)   Resp 14   Ht 5\' 1"  (1.549 m)   Wt 78.9 kg   LMP  12/15/2019   SpO2 94%   BMI 32.88 kg/m   Physical Exam Vitals and nursing note reviewed.  Constitutional:      General: She is not in acute distress.    Appearance: She is well-developed.  HENT:     Head: Normocephalic and atraumatic.  Eyes:     Conjunctiva/sclera: Conjunctivae normal.  Cardiovascular:     Rate and Rhythm: Normal rate and regular rhythm.     Pulses: Normal pulses.  Pulmonary:     Effort: Pulmonary effort is normal. No respiratory distress.     Breath sounds: No stridor.  Abdominal:     General: There is no distension.  Musculoskeletal:     Comments: Functionality of the forearm muscles, hand all unremarkable, elbow unremarkable.  Skin:    General: Skin is warm and dry.       Neurological:     Mental Status: She is alert and oriented to person, place, and time.     Cranial Nerves: No cranial nerve deficit.     ED Results / Procedures / Treatments    Procedures Procedures (including critical care time)  Medications Ordered in ED Medications  sulfamethoxazole-trimethoprim (BACTRIM DS) 800-160 MG per tablet 1 tablet (has no administration in time range)    ED Course  I have reviewed the triage vital signs and the nursing notes.  Pertinent labs & imaging results that were available during my care of the patient were reviewed by me and considered in my medical decision making (see chart for details).  Generally well-appearing female presents with left forearm focal lesion. No evidence for bacteremia, sepsis, with no hemodynamic instability, no fever, no distress. No distal neurovascular compromise. Patient does require antibiotics for cellulitis in the left forearm, no evidence for abscess, requiring drainage, however.  Final Clinical Impression(s) / ED Diagnoses Final diagnoses:  Cellulitis of left upper extremity    Rx / DC Orders ED Discharge Orders         Ordered    sulfamethoxazole-trimethoprim (BACTRIM DS) 800-160 MG tablet  2 times  daily     12/15/19 0827           Carmin Muskrat, MD 12/15/19 [phone]

## 2019-12-15 NOTE — ED Triage Notes (Signed)
Patient presents to the ED with redness, swelling to left forearm for one day.  Patient states she was working in her garden yesterday and saw an unknown spider on her arm.  Patient reports chills for one day.

## 2019-12-15 NOTE — Discharge Instructions (Addendum)
As discussed, today's evaluation has been generally reassuring, but there is need for ongoing antibiotics due to the infection on your left forearm.  Please monitor your condition carefully and do not hesitate to return here if you develop new, or or concerning changes, otherwise be sure to follow-up with your physician as needed.

## 2020-12-09 ENCOUNTER — Emergency Department (HOSPITAL_COMMUNITY)
Admission: EM | Admit: 2020-12-09 | Discharge: 2020-12-09 | Discharge status: Against Medical Advice | Payer: 59 | Attending: Emergency Medicine | Admitting: Emergency Medicine

## 2020-12-09 ENCOUNTER — Emergency Department (HOSPITAL_COMMUNITY): Payer: 59

## 2020-12-09 ENCOUNTER — Encounter (HOSPITAL_COMMUNITY): Payer: Self-pay | Admitting: *Deleted

## 2020-12-09 ENCOUNTER — Other Ambulatory Visit: Payer: Self-pay

## 2020-12-09 DIAGNOSIS — I10 Essential (primary) hypertension: Secondary | ICD-10-CM | POA: Diagnosis not present

## 2020-12-09 DIAGNOSIS — R569 Unspecified convulsions: Secondary | ICD-10-CM | POA: Diagnosis not present

## 2020-12-09 DIAGNOSIS — S3992XA Unspecified injury of lower back, initial encounter: Secondary | ICD-10-CM | POA: Diagnosis present

## 2020-12-09 DIAGNOSIS — S39012A Strain of muscle, fascia and tendon of lower back, initial encounter: Secondary | ICD-10-CM | POA: Diagnosis not present

## 2020-12-09 DIAGNOSIS — F17210 Nicotine dependence, cigarettes, uncomplicated: Secondary | ICD-10-CM | POA: Insufficient documentation

## 2020-12-09 DIAGNOSIS — X501XXA Overexertion from prolonged static or awkward postures, initial encounter: Secondary | ICD-10-CM | POA: Diagnosis not present

## 2020-12-09 HISTORY — DX: Unspecified convulsions: R56.9

## 2020-12-09 LAB — COMPREHENSIVE METABOLIC PANEL
ALT: 14 U/L (ref 0–44)
AST: 26 U/L (ref 15–41)
Albumin: 3.7 g/dL (ref 3.5–5.0)
Alkaline Phosphatase: 43 U/L (ref 38–126)
Anion gap: 6 (ref 5–15)
BUN: 6 mg/dL (ref 6–20)
CO2: 25 mmol/L (ref 22–32)
Calcium: 8.7 mg/dL — ABNORMAL LOW (ref 8.9–10.3)
Chloride: 103 mmol/L (ref 98–111)
Creatinine, Ser: 0.91 mg/dL (ref 0.44–1.00)
GFR, Estimated: >60 mL/min (ref >60)
Glucose, Bld: 143 mg/dL — ABNORMAL HIGH (ref 70–99)
Potassium: 3.9 mmol/L (ref 3.5–5.1)
Sodium: 134 mmol/L — ABNORMAL LOW (ref 135–145)
Total Bilirubin: 0.3 mg/dL (ref 0.3–1.2)
Total Protein: 7 g/dL (ref 6.5–8.1)

## 2020-12-09 LAB — CBC WITH DIFFERENTIAL/PLATELET
Abs Immature Granulocytes: 0.01 10*3/uL (ref 0.00–0.07)
Basophils Absolute: 0 10*3/uL (ref 0.0–0.1)
Basophils Relative: 0 %
Eosinophils Absolute: 0.1 10*3/uL (ref 0.0–0.5)
Eosinophils Relative: 1 %
HCT: 36.5 % (ref 36.0–46.0)
Hemoglobin: 11 g/dL — ABNORMAL LOW (ref 12.0–15.0)
Immature Granulocytes: 0 %
Lymphocytes Relative: 29 %
Lymphs Abs: 1.4 10*3/uL (ref 0.7–4.0)
MCH: 24 pg — ABNORMAL LOW (ref 26.0–34.0)
MCHC: 30.1 g/dL (ref 30.0–36.0)
MCV: 79.7 fL — ABNORMAL LOW (ref 80.0–100.0)
Monocytes Absolute: 0.3 10*3/uL (ref 0.1–1.0)
Monocytes Relative: 6 %
Neutro Abs: 3.1 10*3/uL (ref 1.7–7.7)
Neutrophils Relative %: 64 %
Platelets: 187 10*3/uL (ref 150–400)
RBC: 4.58 MIL/uL (ref 3.87–5.11)
RDW: 17.5 % — ABNORMAL HIGH (ref 11.5–15.5)
WBC: 4.9 10*3/uL (ref 4.0–10.5)
nRBC: 0 % (ref 0.0–0.2)

## 2020-12-09 LAB — URINALYSIS, ROUTINE W REFLEX MICROSCOPIC
Bilirubin Urine: NEGATIVE
Glucose, UA: NEGATIVE mg/dL
Hgb urine dipstick: NEGATIVE
Ketones, ur: NEGATIVE mg/dL
Leukocytes,Ua: NEGATIVE
Nitrite: NEGATIVE
Protein, ur: NEGATIVE mg/dL
Specific Gravity, Urine: 1.015 (ref 1.005–1.030)
pH: 6 (ref 5.0–8.0)

## 2020-12-09 LAB — MAGNESIUM: Magnesium: 1.6 mg/dL — ABNORMAL LOW (ref 1.7–2.4)

## 2020-12-09 LAB — PREGNANCY, URINE: Preg Test, Ur: NEGATIVE

## 2020-12-09 MED ORDER — ACETAMINOPHEN 500 MG PO TABS
1000.0000 mg | ORAL_TABLET | Freq: Once | ORAL | Status: AC
  Administered 2020-12-09: 1000 mg via ORAL
  Filled 2020-12-09: qty 2

## 2020-12-09 MED ORDER — LEVETIRACETAM 500 MG PO TABS: 500.0000 mg | ORAL_TABLET | Freq: Two times a day (BID) | ORAL | 0 refills | Status: DC

## 2020-12-09 MED ORDER — LORAZEPAM 2 MG/ML IJ SOLN
1.0000 mg | Freq: Once | INTRAMUSCULAR | Status: AC
  Administered 2020-12-09: 1 mg via INTRAVENOUS
  Filled 2020-12-09: qty 1

## 2020-12-09 MED ORDER — LEVETIRACETAM IN NACL 1000 MG/100ML IV SOLN
1000.0000 mg | Freq: Once | INTRAVENOUS | Status: AC
  Administered 2020-12-09: 1000 mg via INTRAVENOUS
  Filled 2020-12-09: qty 100

## 2020-12-09 MED ORDER — MAGNESIUM SULFATE IN D5W 1-5 GM/100ML-% IV SOLN
1.0000 g | Freq: Once | INTRAVENOUS | Status: AC
  Administered 2020-12-09: 1 g via INTRAVENOUS
  Filled 2020-12-09: qty 100

## 2020-12-09 MED ORDER — FENTANYL CITRATE (PF) 100 MCG/2ML IJ SOLN: 50.0000 ug | Freq: Once | INTRAMUSCULAR | Status: DC

## 2020-12-09 MED ORDER — LORAZEPAM 1 MG PO TABS: 1.0000 mg | ORAL_TABLET | Freq: Three times a day (TID) | ORAL | 0 refills | Status: DC | PRN

## 2020-12-09 NOTE — ED Provider Notes (Signed)
Care assumed from [name], [name]. See his note for full H&P.   Per his note, "Patient is a 45 year old female with a history of seizures, peripheral neuropathy, hypertension, hyperlipidemia, hepatitis C, chronic pain syndrome, who presents to the emergency department due to low back pain.  Patient states that her symptoms have been ongoing for the past few weeks.  States they are intermittent.  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  She states that she had a coughing fit this morning and her pain suddenly worsened.  Reports that it is in her right low back.  No numbness or weakness.  No bowel or bladder incontinence. Pt takes Suboxone TID.   Patient reports a history of seizures and states that she was diagnosed initially in 2013.  She states that she used to take Keppra but did not have a seizure for years so she discontinued this medication about 2 years ago.  While in the waiting room she states that she felt a sensation of "pins-and-needles" across her body and had a brief episode of LOC.  She states that this felt similar to prior seizures.  Nursing staff witnessed the event and noted that patient became "rigid in the waiting room" before losing consciousness.  Patient states that besides her back pain she has no other complaints."   Physical Exam  BP 102/69   Pulse 67   Temp 97.6 F (36.4 C) (Oral)   Resp 14   Ht 5\' 1"  (1.549 m)   Wt 78.9 kg   SpO2 95%   BMI 32.88 kg/m   Physical Exam Vitals and nursing note reviewed.  Constitutional:      General: She is not in acute distress.    Appearance: She is well-developed.  HENT:     Head: Normocephalic and atraumatic.  Eyes:     Conjunctiva/sclera: Conjunctivae normal.  Cardiovascular:     Rate and Rhythm: Normal rate.  Pulmonary:     Effort: Pulmonary effort is normal.  Musculoskeletal:        General: Normal range of motion.      Cervical back: Neck supple.     Comments: Walking around room in no distress  Skin:    General: Skin is warm and dry.  Neurological:     Mental Status: She is alert.       ED Course/Procedures   Clinical Course as of 12/09/20 1849  Mon Dec 09, 2020  1710 Patient reassessed.  Notes moderate resolution of her back pain.  Awaiting results of UA and urine pregnancy test.  Patient receiving IV Keppra at this time.  Will additionally need IV magnesium. [LJ]    Clinical Course User Index [LJ] Dec 11, 2020, PA-C    Procedures  Results for orders placed or performed during the hospital encounter of 12/09/20  Comprehensive metabolic panel  Result Value Ref Range   Sodium 134 (L) 135 - 145 mmol/L   Potassium 3.9 3.5 - 5.1 mmol/L   Chloride 103 98 - 111 mmol/L   CO2 25 22 - 32 mmol/L   Glucose, Bld 143 (H) 70 - 99 mg/dL   BUN 6 6 - 20 mg/dL   Creatinine, Ser 12/11/20 0.44 - 1.00 mg/dL   Calcium 8.7 (L) 8.9 - 10.3 mg/dL   Total Protein 7.0 6.5 - 8.1 g/dL   Albumin 3.7 3.5 - 5.0 g/dL   AST 26 15 - 41  U/L   ALT 14 0 - 44 U/L   Alkaline Phosphatase 43 38 - 126 U/L   Total Bilirubin 0.3 0.3 - 1.2 mg/dL   GFR, Estimated >17 >40 mL/min   Anion gap 6 5 - 15  CBC with Differential  Result Value Ref Range   WBC 4.9 4.0 - 10.5 K/uL   RBC 4.58 3.87 - 5.11 MIL/uL   Hemoglobin 11.0 (L) 12.0 - 15.0 g/dL   HCT 81.4 48.1 - 85.6 %   MCV 79.7 (L) 80.0 - 100.0 fL   MCH 24.0 (L) 26.0 - 34.0 pg   MCHC 30.1 30.0 - 36.0 g/dL   RDW 31.4 (H) 97.0 - 26.3 %   Platelets 187 150 - 400 K/uL   nRBC 0.0 0.0 - 0.2 %   Neutrophils Relative % 64 %   Neutro Abs 3.1 1.7 - 7.7 K/uL   Lymphocytes Relative 29 %   Lymphs Abs 1.4 0.7 - 4.0 K/uL   Monocytes Relative 6 %   Monocytes Absolute 0.3 0.1 - 1.0 K/uL   Eosinophils Relative 1 %   Eosinophils Absolute 0.1 0.0 - 0.5 K/uL   Basophils Relative 0 %   Basophils Absolute 0.0 0.0 - 0.1 K/uL   Immature Granulocytes 0 %   Abs Immature Granulocytes 0.01 0.00 -  0.07 K/uL  Magnesium  Result Value Ref Range   Magnesium 1.6 (L) 1.7 - 2.4 mg/dL   CT Head Wo Contrast  Result Date: 12/09/2020 CLINICAL DATA:  Seizure activity today, initial encounter EXAM: CT HEAD WITHOUT CONTRAST TECHNIQUE: Contiguous axial images were obtained from the base of the skull through the vertex without intravenous contrast. COMPARISON:  04/07/2017 FINDINGS: Brain: No evidence of acute infarction, hemorrhage, hydrocephalus, extra-axial collection or mass lesion/mass effect. Vascular: No hyperdense vessel or unexpected calcification. Skull: Normal. Negative for fracture or focal lesion. Sinuses/Orbits: No acute finding. Other: None. IMPRESSION: No acute intracranial abnormality noted. Electronically Signed   By: Alcide Clever M.D.   On: 12/09/2020 15:46     MDM   45 y/o F presenting for eval of back pain. Had seizure while waiting. Hx same but not on meds. W/u reassuring thus far at shift change, pt pending UA/Urine preg.  I was alerted by nursing staff that the patient wanted to leave because her ride was here.  I saw the patient at bedside and advised her that her urinalysis and urine pregnancy tests are in process and that they would result within the next few minutes so I recommended that she stay.  She stated that she would stay however I was later informed by nursing staff the patient eloped.  She do not receive remainder of her results and did not get her discharge paperwork.      Karrie Meres, PA-C 12/09/20 2252    Mancel Bale, MD 12/10/20 0010

## 2020-12-09 NOTE — ED Triage Notes (Addendum)
Pt with lower back since her work dept changed.  Pt with lower back pain all weekend long.  Pt had gotten rigid while out in the waiting room and yelling, possible back spasm. Pt states she felt something popped in her back after she had coughed this morning.    Pt with hx seizures and has not had her seizure medication for a year now.

## 2020-12-09 NOTE — Discharge Instructions (Signed)
I prescribed you 2 medications.  First medication is called Ativan.  This is a benzodiazepine that will help with your muscle spasms.  I would recommend that you initially take Tylenol 3-4 times during the day.  Please follow the instructions on the bottle.  Do not take more than 4000 mg of Tylenol in a day.  If you are still experiencing significant pain then you can also take a single Ativan.  Only take this as prescribed.  This medication can be sedating so do not mix with alcohol.  Do not drive a car after taking it.  Second medication is called Keppra.  This is the medication that used to take for your seizure disorder.  Please take this twice a day.  Please follow-up with your regular doctor regarding this medication as well as its dosing.  If you develop any new or worsening symptoms, please come back to the emergency department for reevaluation.  It was a pleasure to meet you.

## 2020-12-09 NOTE — ED Provider Notes (Cosign Needed)
[facility] EMERGENCY DEPARTMENT Provider Note   CSN: [id number] Arrival date & time: 12/09/20  1356     History Chief Complaint  Patient presents with  . Back Pain    Mary Jarvis is a 45 y.o. female.  HPI Patient is a 45 year old female with a history of seizures, peripheral neuropathy, hypertension, hyperlipidemia, hepatitis C, chronic pain syndrome, who presents to the emergency department due to low back pain.  Patient states that her symptoms have been ongoing for the past few weeks.  States they are intermittent.  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  She states that she had a coughing fit this morning and her pain suddenly worsened.  Reports that it is in her right low back.  No numbness or weakness.  No bowel or bladder incontinence. Pt takes Suboxone TID.   Patient reports a history of seizures and states that she was diagnosed initially in 2013.  She states that she used to take Keppra but did not have a seizure for years so she discontinued this medication about 2 years ago.  While in the waiting room she states that she felt a sensation of "pins-and-needles" across her body and had a brief episode of LOC.  She states that this felt similar to prior seizures.  Nursing staff witnessed the event and noted that patient became "rigid in the waiting room" before losing consciousness.  Patient states that besides her back pain she has no other complaints.    Past Medical History:  Diagnosis Date  . Chronic pain syndrome   . Gastric ulcer   . Generalized anxiety disorder   . Hepatitis C   . Hyperlipidemia   . Hypertension   . Kidney stones   . Liver failure (HCC)   . Peripheral neuropathy   . Seizures (HCC)   . Tachycardia     Patient Active Problem List   Diagnosis Date Noted  . Hepatitis C 12/12/2012  . 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 KNEE PAIN, LEFT  09/29/2007    Past Surgical History:  Procedure Laterality Date  . FOOT SURGERY    . rt foot surgery    . TUBAL LIGATION       OB History    Gravida  2   Para  2   Term  2   Preterm  0   AB  0   Living        SAB  0   IAB  0   Ectopic  0   Multiple      Live Births              Family History  Problem Relation Age of Onset  . Heart disease Mother   . Diabetes Mother   . Peripheral Artery Disease Father     Social History   Tobacco Use  . Smoking status: Current Every Day Smoker    Packs/day: 1.00    Types: Cigarettes  . Smokeless tobacco: Never Used  Vaping Use  . Vaping Use: Never used  Substance Use Topics  . Alcohol use: Not Currently  . Drug use: Yes    Types: Marijuana    Comment: Methadone - off for 2 years    Home Medications Prior to Admission medications   Medication Sig Start Date End Date Taking? Authorizing Provider  buprenorphine-naloxone (SUBOXONE) 8-2 mg SUBL SL tablet Place 1 tablet under  the tongue 3 (three) times daily. 12/06/20  Yes [provider]  levETIRAcetam (KEPPRA) 500 MG tablet Take 1 tablet (500 mg total) by mouth 2 (two) times daily. 12/09/20 01/08/21 Yes Kylle Lall, PA-C  LORazepam (ATIVAN) 1 MG tablet Take 1 tablet (1 mg total) by mouth every 8 (eight) hours as needed (muscle spasm that doesn't resolve with tylenol). 12/09/20  Yes Placido Sou, PA-C  SYMBICORT 80-4.5 MCG/ACT inhaler Inhale 1-2 puffs into the lungs 2 (two) times daily as needed. 09/06/20  Yes [provider]  trolamine salicylate (ASPERCREME) 10 % cream Apply 1 application topically as needed for muscle pain.   Yes [provider]  albuterol (PROVENTIL) (2.5 MG/3ML) 0.083% nebulizer solution Take 3 mLs (2.5 mg total) by nebulization every 6 (six) hours as needed for wheezing or shortness of breath. Patient not taking: Reported on 12/09/2020 02/28/19   Graciella Freer A, PA-C  albuterol (VENTOLIN HFA) 108 (90 Base)  MCG/ACT inhaler Inhale 1-2 puffs into the lungs every 6 (six) hours as needed for wheezing or shortness of breath. Patient not taking: Reported on 12/09/2020 02/28/19   Graciella Freer A, PA-C  pantoprazole (PROTONIX) 20 MG tablet Take 1 tablet (20 mg total) by mouth daily. Patient not taking: No sig reported 01/12/19   Loren Racer, MD    Allergies    Aspirin, Ibuprofen, Tramadol, and Ancef [cefazolin]  Review of Systems   Review of Systems  All other systems reviewed and are negative. Ten systems reviewed and are negative for acute change, except as noted in the HPI.   Physical Exam Updated Vital Signs BP 102/69   Pulse 67   Temp 97.6 F (36.4 C) (Oral)   Resp 14   Ht 5\' 1"  (1.549 m)   Wt 78.9 kg   SpO2 95%   BMI 32.88 kg/m   Physical Exam Vitals and nursing note reviewed.  Constitutional:      General: She is not in acute distress.    Appearance: Normal appearance. She is not ill-appearing, toxic-appearing or diaphoretic.  HENT:     Head: Normocephalic and atraumatic.     Right Ear: External ear normal.     Left Ear: External ear normal.     Nose: Nose normal.     Mouth/Throat:     Mouth: Mucous membranes are moist.     Pharynx: Oropharynx is clear. No oropharyngeal exudate or posterior oropharyngeal erythema.  Eyes:     General: No scleral icterus.       Right eye: No discharge.        Left eye: No discharge.     Extraocular Movements: Extraocular movements intact.     Conjunctiva/sclera: Conjunctivae normal.     Pupils: Pupils are equal, round, and reactive to light.  Cardiovascular:     Rate and Rhythm: Normal rate and regular rhythm.     Pulses: Normal pulses.          Radial pulses are 2+ on the right side and 2+ on the left side.       Dorsalis pedis pulses are 2+ on the right side and 2+ on the left side.     Heart sounds: Normal heart sounds. No murmur heard. No friction rub. No gallop.   Pulmonary:     Effort: Pulmonary effort is normal. No  respiratory distress.     Breath sounds: Normal breath sounds. No stridor. No wheezing, rhonchi or rales.  Abdominal:     General: Abdomen is flat.  Palpations: Abdomen is soft.     Tenderness: There is no abdominal tenderness.     Comments: Abdomen is soft and nontender in all 4 quadrants.  Musculoskeletal:        General: Tenderness present. Normal range of motion.     Cervical back: Normal range of motion and neck supple. No tenderness.     Comments: Moderate tenderness appreciated along the right lumbar musculature.  No midline C, T, or L-spine tenderness.  No overlying skin changes.  Skin:    General: Skin is warm and dry.  Neurological:     General: No focal deficit present.     Mental Status: She is alert and oriented to person, place, and time.     Comments: Patient is oriented to person, place, and time. Patient phonates in clear, complete, and coherent sentences. Strength is 5/5 in all four extremities.  No gross deficits.  Distal sensation intact in all four extremities.  Psychiatric:        Mood and Affect: Mood normal.        Behavior: Behavior normal.    ED Results / Procedures / Treatments   Labs (all labs ordered are listed, but only abnormal results are displayed) Labs Reviewed  COMPREHENSIVE METABOLIC PANEL - Abnormal; Notable for the following components:      Result Value   Sodium 134 (*)    Glucose, Bld 143 (*)    Calcium 8.7 (*)    All other components within normal limits  CBC WITH DIFFERENTIAL/PLATELET - Abnormal; Notable for the following components:   Hemoglobin 11.0 (*)    MCV 79.7 (*)    MCH 24.0 (*)    RDW 17.5 (*)    All other components within normal limits  MAGNESIUM - Abnormal; Notable for the following components:   Magnesium 1.6 (*)    All other components within normal limits  URINALYSIS, ROUTINE W REFLEX MICROSCOPIC  PREGNANCY, URINE    EKG None  Radiology CT Head Wo Contrast  Result Date: 12/09/2020 CLINICAL DATA:  Seizure  activity today, initial encounter EXAM: CT HEAD WITHOUT CONTRAST TECHNIQUE: Contiguous axial images were obtained from the base of the skull through the vertex without intravenous contrast. COMPARISON:  04/07/2017 FINDINGS: Brain: No evidence of acute infarction, hemorrhage, hydrocephalus, extra-axial collection or mass lesion/mass effect. Vascular: No hyperdense vessel or unexpected calcification. Skull: Normal. Negative for fracture or focal lesion. Sinuses/Orbits: No acute finding. Other: None. IMPRESSION: No acute intracranial abnormality noted. Electronically Signed   By: Alcide Clever M.D.   On: 12/09/2020 15:46   Procedures Procedures   Medications Ordered in ED Medications  LORazepam (ATIVAN) injection 1 mg (1 mg Intravenous Given 12/09/20 1500)  acetaminophen (TYLENOL) tablet 1,000 mg (1,000 mg Oral Given 12/09/20 1459)  levETIRAcetam (KEPPRA) IVPB 1000 mg/100 mL premix (0 mg Intravenous Stopped 12/09/20 1714)  magnesium sulfate IVPB 1 g 100 mL (0 g Intravenous Stopped 12/09/20 1834)    ED Course  I have reviewed the triage vital signs and the nursing notes.  Pertinent labs & imaging results that were available during my care of the patient were reviewed by me and considered in my medical decision making (see chart for details).  Clinical Course as of 12/09/20 1853  Mon Dec 09, 2020  1710 Patient reassessed.  Notes moderate resolution of her back pain.  Awaiting results of UA and urine pregnancy test.  Patient receiving IV Keppra at this time.  Will additionally need IV magnesium. [LJ]    Clinical  Course User Index [LJ] Placido Sou, PA-C   MDM Rules/Calculators/A&P                          Pt is a 45 y.o. female who presents to the emergency department due to what appears to be lumbar strain as well as possible seizure-like activity that occurred in triage at the emergency department.  Labs: CBC with a hemoglobin of 11, MCV of 79.7, RDW of 79.5. CMP with a sodium of 134,  glucose of 143, calcium of 8.7. Magnesium of 1.6.  Patient given 1 g of IV magnesium.  Imaging: CT scan of the head is negative for acute intracranial abnormality.  I, Placido Sou, PA-C, personally reviewed and evaluated these images and lab results as part of my medical decision-making.  Patient has palpable pain to the right lumbar region.  No midline spine pain.  Negative straight leg raise and contralateral straight leg raise.  Neurovascularly intact in the lower extremities.  No red flags.  She is currently on Suboxone 3 times daily.  Patient given a dose of Tylenol as well as Ativan in the emergency department with moderate relief of her pain.  Patient also had a witnessed episode of rigidity in the lobby upon arrival.  Patient states she syncopized briefly.  Did not appear postictal on my exam.  She does have a history of seizure disorder and used to take Keppra but discontinued this about 2 years ago due to not having had seizures for many years.  Patient's neurological exam was benign.  She was given a loading dose of Keppra and discharged on a course of Keppra.  Recommended that she follow-up with her PCP regarding her Keppra use as well as possible recent seizure-like activity.  Also discussed refraining from driving for the next 6 months.  Lab work is generally reassuring.  She did have some mild hypomagnesemia which was repleted with IV magnesium.  Feel the patient is stable for discharge at this time and she is agreeable.  She was given a short course of p.o. Ativan.  Did not feel that narcotic medication was warranted given patient's Suboxone use.  Also given her seizure history did not want to prescribe muscle relaxers.  We discussed safety regarding Ativan use. Recommended she take tylenol throughout the day for her pain and only use Ativan for breakthrough pain.   It is in my shift and patient care is being transferred to [facility].  Patient pending UA.  If her UA is  negative she can be discharged with PCP follow-up.  Note: Portions of this report may have been transcribed using voice recognition software. Every effort was made to ensure accuracy; however, inadvertent computerized transcription errors may be present.   Final Clinical Impression(s) / ED Diagnoses Final diagnoses:  Strain of lumbar region, initial encounter  Seizure-like activity (HCC)   Rx / DC Orders ED Discharge Orders         Ordered    LORazepam (ATIVAN) 1 MG tablet  Every 8 hours PRN        12/09/20 1828    levETIRAcetam (KEPPRA) 500 MG tablet  2 times daily        12/09/20 1828           Placido Sou, PA-C 12/09/20 1853

## 2020-12-21 ENCOUNTER — Emergency Department (HOSPITAL_COMMUNITY): Payer: 59

## 2020-12-21 ENCOUNTER — Other Ambulatory Visit: Payer: Self-pay

## 2020-12-21 ENCOUNTER — Encounter (HOSPITAL_COMMUNITY): Payer: Self-pay

## 2020-12-21 ENCOUNTER — Emergency Department (HOSPITAL_COMMUNITY)
Admission: EM | Admit: 2020-12-21 | Discharge: 2020-12-21 | Disposition: A | Discharge status: Home / Self Care | Payer: 59 | Attending: Emergency Medicine | Admitting: Emergency Medicine

## 2020-12-21 DIAGNOSIS — M778 Other enthesopathies, not elsewhere classified: Secondary | ICD-10-CM | POA: Diagnosis not present

## 2020-12-21 DIAGNOSIS — M79602 Pain in left arm: Secondary | ICD-10-CM | POA: Diagnosis present

## 2020-12-21 DIAGNOSIS — I10 Essential (primary) hypertension: Secondary | ICD-10-CM | POA: Insufficient documentation

## 2020-12-21 DIAGNOSIS — F17210 Nicotine dependence, cigarettes, uncomplicated: Secondary | ICD-10-CM | POA: Insufficient documentation

## 2020-12-21 MED ORDER — METHOCARBAMOL 500 MG PO TABS: 500.0000 mg | ORAL_TABLET | Freq: Four times a day (QID) | ORAL | 0 refills | Status: DC

## 2020-12-21 NOTE — ED Provider Notes (Signed)
[facility] EMERGENCY DEPARTMENT Provider Note   CSN: [id number] Arrival date & time: 12/21/20  0941     History Chief Complaint  Patient presents with  . Arm Pain    Mary Jarvis is a 45 y.o. female.  The history is provided by the patient. No language interpreter was used.  Arm Pain This is a new problem. Episode onset: 3 weeks. The problem occurs constantly. The problem has been gradually worsening. Nothing aggravates the symptoms. Nothing relieves the symptoms. She has tried nothing for the symptoms.   Pt reports she has been using a machine at work that jerks her arm.  Pt complains of pain     Past Medical History:  Diagnosis Date  . Chronic pain syndrome   . Gastric ulcer   . Generalized anxiety disorder   . Hepatitis C   . Hyperlipidemia   . Hypertension   . Kidney stones   . Liver failure (HCC)   . Peripheral neuropathy   . Seizures (HCC)   . Tachycardia     Patient Active Problem List   Diagnosis Date Noted  . Hepatitis C 12/12/2012  . 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 KNEE PAIN, LEFT 09/29/2007    Past Surgical History:  Procedure Laterality Date  . FOOT SURGERY    . rt foot surgery    . TUBAL LIGATION       OB History    Gravida  2   Para  2   Term  2   Preterm  0   AB  0   Living        SAB  0   IAB  0   Ectopic  0   Multiple      Live Births              Family History  Problem Relation Age of Onset  . Heart disease Mother   . Diabetes Mother   . Peripheral Artery Disease Father     Social History   Tobacco Use  . Smoking status: Current Every Day Smoker    Packs/day: 1.00    Types: Cigarettes  . Smokeless tobacco: Never Used  Vaping Use  . Vaping Use: Never used  Substance Use Topics  . Alcohol use: Not Currently  . Drug use: Not Currently    Types: Marijuana    Comment: Methadone - off for 2 years    Home Medications Prior to Admission medications   Medication Sig Start Date End  Date Taking? Authorizing Provider  albuterol (PROVENTIL) (2.5 MG/3ML) 0.083% nebulizer solution Take 3 mLs (2.5 mg total) by nebulization every 6 (six) hours as needed for wheezing or shortness of breath. Patient not taking: Reported on 12/09/2020 02/28/19   Graciella Freer A, PA-C  albuterol (VENTOLIN HFA) 108 (90 Base) MCG/ACT inhaler Inhale 1-2 puffs into the lungs every 6 (six) hours as needed for wheezing or shortness of breath. Patient not taking: Reported on 12/09/2020 02/28/19   Graciella Freer A, PA-C  buprenorphine-naloxone (SUBOXONE) 8-2 mg SUBL SL tablet Place 1 tablet under the tongue 3 (three) times daily. 12/06/20   [provider]  levETIRAcetam (KEPPRA) 500 MG tablet Take 1 tablet (500 mg total) by mouth 2 (two) times daily. 12/09/20 01/08/21  Placido Sou, PA-C  LORazepam (ATIVAN) 1 MG tablet Take 1 tablet (1 mg total) by mouth every 8 (eight) hours as needed (muscle spasm that doesn't resolve with tylenol). 12/09/20   Placido Sou, PA-C  pantoprazole (PROTONIX) 20  MG tablet Take 1 tablet (20 mg total) by mouth daily. Patient not taking: No sig reported 01/12/19   Loren Racer, MD  SYMBICORT 80-4.5 MCG/ACT inhaler Inhale 1-2 puffs into the lungs 2 (two) times daily as needed. 09/06/20   [provider]  trolamine salicylate (ASPERCREME) 10 % cream Apply 1 application topically as needed for muscle pain.    [provider]    Allergies    Aspirin, Ibuprofen, Tramadol, and Ancef [cefazolin]  Review of Systems   Review of Systems  All other systems reviewed and are negative.   Physical Exam Updated Vital Signs BP (!) 124/96 (BP Location: Right Arm)   Pulse 86   Temp 97.8 F (36.6 C) (Oral)   Resp 16   Ht 5' (1.524 m)   Wt 78.5 kg   SpO2 100%   BMI 33.79 kg/m   Physical Exam Vitals and nursing note reviewed.  Constitutional:      Appearance: She is well-developed.  HENT:     Head: Normocephalic.  Cardiovascular:     Rate and Rhythm:  Normal rate.  Pulmonary:     Effort: Pulmonary effort is normal.  Abdominal:     General: There is no distension.  Musculoskeletal:        General: Tenderness present. No swelling or deformity. Normal range of motion.     Cervical back: Normal range of motion.     Comments: Pain with range of motion nv and ns intact   Neurological:     General: No focal deficit present.     Mental Status: She is alert and oriented to person, place, and time.  Psychiatric:        Mood and Affect: Mood normal.     ED Results / Procedures / Treatments   Labs (all labs ordered are listed, but only abnormal results are displayed) Labs Reviewed - No data to display  EKG None  Radiology DG Shoulder Left  Result Date: 12/21/2020 CLINICAL DATA:  Left shoulder pain for several days. Manual labor at work. No specific injury reported. EXAM: LEFT SHOULDER - 2+ VIEW COMPARISON:  07/23/2018. FINDINGS: There is no evidence of fracture or dislocation. There is no evidence of arthropathy or other focal bone abnormality. Soft tissues are unremarkable. IMPRESSION: Negative. Electronically Signed   By: Amie Portland M.D.   On: 12/21/2020 10:39    Procedures Procedures   Medications Ordered in ED Medications - No data to display  ED Course  I have reviewed the triage vital signs and the nursing notes.  Pertinent labs & imaging results that were available during my care of the patient were reviewed by me and considered in my medical decision making (see chart for details).    MDM Rules/Calculators/A&P                          MDM;  Herby Abraham is normal.  I suspect tendonitis.  Pt on suboxone therapy.  I will treat with voltaren and robaxin  Final Clinical Impression(s) / ED Diagnoses Final diagnoses:  Shoulder tendonitis, left    Rx / DC Orders ED Discharge Orders         Ordered    methocarbamol (ROBAXIN) 500 MG tablet  4 times daily        12/21/20 1106        An After Visit Summary was printed and  given to the patient.    Elson Areas, [name] 12/21/20 1106  Maia Plan, MD 12/22/20 [phone]

## 2020-12-21 NOTE — ED Triage Notes (Signed)
Pt to er, pt states that she is here for L arm pain, states that she changed jobs at work and has hurt her arm, states that at sometimes her arm is also weak, states that the pain is in her shoulder blade, states that there is a grinding noise when she moves.

## 2020-12-21 NOTE — Discharge Instructions (Addendum)
See the Orthopaedist for evaluation  

## 2020-12-25 ENCOUNTER — Other Ambulatory Visit: Payer: Self-pay

## 2020-12-25 ENCOUNTER — Encounter: Payer: Self-pay | Admitting: Orthopedic Surgery

## 2020-12-25 ENCOUNTER — Ambulatory Visit (INDEPENDENT_AMBULATORY_CARE_PROVIDER_SITE_OTHER): Payer: 59 | Admitting: Orthopedic Surgery

## 2020-12-25 ENCOUNTER — Ambulatory Visit: Payer: 59

## 2020-12-25 VITALS — BP 122/78 | HR 93 | Ht 62.0 in | Wt 186.4 lb

## 2020-12-25 DIAGNOSIS — M542 Cervicalgia: Secondary | ICD-10-CM

## 2020-12-25 DIAGNOSIS — M5030 Other cervical disc degeneration, unspecified cervical region: Secondary | ICD-10-CM

## 2020-12-25 DIAGNOSIS — M792 Neuralgia and neuritis, unspecified: Secondary | ICD-10-CM

## 2020-12-25 MED ORDER — PREDNISONE 10 MG (48) PO TBPK: ORAL_TABLET | Freq: Every day | ORAL | 0 refills | Status: DC

## 2020-12-25 MED ORDER — GABAPENTIN 100 MG PO CAPS: 100.0000 mg | ORAL_CAPSULE | Freq: Three times a day (TID) | ORAL | 2 refills | Status: DC

## 2020-12-25 NOTE — Patient Instructions (Addendum)
OOW 3 WEEKS  While we are working on your approval for MRI please go ahead and call to schedule your appointment with [facility] within at least one (1) week.   Central Scheduling [phone]

## 2020-12-25 NOTE — Progress Notes (Signed)
NEW PATIENT  Chief Complaint  Patient presents with  . Arm Injury    DOI 12/16/20/ L/went to hug my husband good night and felt a popped and pain in my shoulder. My arm and fingers are numb all the night.   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  She said she started having pain in the neck retroclavicular area and pain running down the left arm which is better described as numbness especially the small and ring finger.  When she went to hug her husband the symptoms just got worse  She did go to the ER shoulder x-rays were negative she was treated for tendinitis of the shoulder  Now she has weakness in her left arm inability to hold things in her left hand  BP 122/78   Pulse 93   Ht 5\' 2"  (1.575 m)   Wt 186 lb 6 oz (84.5 kg)   BMI 34.09 kg/m   Physical Exam Constitutional:      General: She is not in acute distress.    Appearance: She is well-developed.     Comments: Well developed, well nourished Normal grooming and hygiene     Cardiovascular:     Comments: No peripheral edema Musculoskeletal:     Comments: Left shoulder Skin is normal Strength is normal  Stability tests are normal Range of motion is normal No real tenderness  Tenderness is in the trapezius muscle base of the cervical spine exacerbated by rotating the head to the right leaning the head to the right and then lifting the arm up.  She has weakness in the hand ulnar nerve    Skin:    General: Skin is warm and dry.     Capillary Refill: Capillary refill takes less than 2 seconds.  Neurological:     Mental Status: She is alert and oriented to person, place, and time.     Sensory: Sensory deficit present.     Motor: Weakness present.     Coordination: Coordination normal.     Gait: Gait normal.     Deep Tendon Reflexes: Reflexes are normal and symmetric.  Psychiatric:        Mood  and Affect: Mood normal.        Behavior: Behavior normal.        Thought Content: Thought content normal.        Judgment: Judgment normal.     Comments: Affect normal      Shoulder x-ray from the hospital read as normal  Cervical spine x-ray DDD C SPINE  Meds ordered this encounter  Medications  . predniSONE (STERAPRED UNI-PAK 48 TAB) 10 MG (48) TBPK tablet    Sig: Take by mouth daily.    Dispense:  48 tablet    Refill:  0  . gabapentin (NEURONTIN) 100 MG capsule    Sig: Take 1 capsule (100 mg total) by mouth 3 (three) times daily.    Dispense:  90 capsule    Refill:  2   Recommend she stay out of work for the next 3 weeks and we will get an MRI of her C-spine OOW X 3 WEEKS

## 2020-12-30 ENCOUNTER — Telehealth: Payer: Self-pay | Admitting: Radiology

## 2020-12-30 NOTE — Telephone Encounter (Signed)
  MRI scan denied by [employer]. I called patient to advise and her phone cut out. We will need to cancel the study.   If she calls back MRI denied by [facility] health. She needs an appointment to follow up in 6 weeks, if she is not better we will have to try again for the study  She can try physical therapy if she would like

## 2020-12-31 ENCOUNTER — Telehealth: Payer: Self-pay | Admitting: Orthopedic Surgery

## 2020-12-31 NOTE — Telephone Encounter (Signed)
Called back to patient to re-verify per notes about her follow up visit due to MRI denial by insurer [facility], of which patient was aware. Per most recent note, patient is to return in 6 weeks, rather than the original 3 week appointment. The 6 week appointment has been scheduled accordingly. Patient has been out of work, however, now that she is aware of the extended time frame it may take for MRI approval to be re-tried, she is asking:  (1) if she may return to light duty work. 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 Please advise.  (2) if she may be given home exercises to do rather than going to physical therapy, as states "we are down to one car."  Please advise.

## 2021-01-01 NOTE — Telephone Encounter (Signed)
Yes, ok for light duty work/ wants to RTW on 622 ok to work   If she wants to do home exercises that is fine insurance will not cover MRI without therapy, patient voiced understanding prefers to go ahead and just do the home exercises anyway.  Note mailed to patient.

## 2021-01-08 ENCOUNTER — Telehealth: Payer: Self-pay | Admitting: Orthopedic Surgery

## 2021-01-08 ENCOUNTER — Ambulatory Visit (HOSPITAL_COMMUNITY): Payer: 59

## 2021-01-08 ENCOUNTER — Other Ambulatory Visit: Payer: Self-pay | Admitting: Orthopedic Surgery

## 2021-01-08 DIAGNOSIS — M542 Cervicalgia: Secondary | ICD-10-CM

## 2021-01-08 DIAGNOSIS — M792 Neuralgia and neuritis, unspecified: Secondary | ICD-10-CM

## 2021-01-08 MED ORDER — GABAPENTIN 100 MG PO CAPS: ORAL_CAPSULE | ORAL | 1 refills | Status: DC

## 2021-01-08 NOTE — Telephone Encounter (Signed)
Patient called and states her arm is hurting, but she doesn't have any feeling in the hand.  She is taking gabapentin (NEURONTIN) 100 MG capsule and it is not working.   She is requesting a higher dose of the gabapentin,   [facility] [place]   She is going to try to go back to work next Thursday .... but she doesn't know if she will be able to do that.

## 2021-01-08 NOTE — Progress Notes (Signed)
Meds ordered this encounter  Medications   gabapentin (NEURONTIN) 100 MG capsule    Sig: Take 2 caps every 8 hrs    Dispense:  90 capsule    Refill:  1

## 2021-01-09 NOTE — Telephone Encounter (Signed)
MRI denied by insurance, [employer], she has not had conservative treatment. She can not go for physical therapy, said she looked up exercises on line and is doing those. I told her range only, no weights, and she agreed. She is aware [employer] will not cover MRI without physical therapy, but states can not even go for one visit.

## 2021-01-10 MED ORDER — GABAPENTIN 100 MG PO CAPS: ORAL_CAPSULE | ORAL | 1 refills | Status: DC

## 2021-01-10 NOTE — Telephone Encounter (Signed)
She uses [facility] [place], sent it there instead of [employer] with patient she does not have rash, has burning pain.

## 2021-01-10 NOTE — Telephone Encounter (Signed)
Patient is calling back about her RX  she does not use [place]  per beginning of phone note it says   [facility]  [place]   Please call patient back she states now her arm is burning and tingling and like she has a rash on it.

## 2021-01-13 ENCOUNTER — Telehealth: Payer: Self-pay | Admitting: Orthopedic Surgery

## 2021-01-13 NOTE — Telephone Encounter (Signed)
Patient called regarding her return to work, for which she had received a note as requested for 01/15/21. Relays her employer [employer] (ph#[phone]/fax [phone])requests that Dr Romeo Apple review her job description, and release her to full duty, no restrictions for date 01/17/21, if he feels is appropriate. Patient states she needs to return to work. 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

## 2021-01-15 ENCOUNTER — Ambulatory Visit: Payer: 59 | Admitting: Orthopedic Surgery

## 2021-01-16 ENCOUNTER — Encounter: Payer: Self-pay | Admitting: Orthopedic Surgery

## 2021-02-03 ENCOUNTER — Other Ambulatory Visit: Payer: Self-pay | Admitting: Orthopedic Surgery

## 2021-02-03 ENCOUNTER — Ambulatory Visit: Payer: 59 | Admitting: Orthopedic Surgery

## 2021-02-03 DIAGNOSIS — M792 Neuralgia and neuritis, unspecified: Secondary | ICD-10-CM

## 2021-02-03 DIAGNOSIS — M542 Cervicalgia: Secondary | ICD-10-CM

## 2021-02-03 MED ORDER — GABAPENTIN 100 MG PO CAPS
ORAL_CAPSULE | ORAL | 1 refills | Status: DC
Start: 2021-02-03 — End: 2021-02-24

## 2021-02-03 NOTE — Telephone Encounter (Signed)
Patient requests prescription for Gabapentin 100 mgs.  Qty  90      Sig: Take 2 caps every 8 hrs    She [facility] [place]

## 2021-02-20 ENCOUNTER — Ambulatory Visit: Payer: 59 | Admitting: Orthopedic Surgery

## 2021-02-20 ENCOUNTER — Telehealth: Payer: Self-pay | Admitting: Radiology

## 2021-02-20 NOTE — Telephone Encounter (Signed)
No [facility] will not cover without conservative treatment, she declined physical therapy, states can do home exercises / she is aware [facility] will not cover MRI without physical therapy

## 2021-02-20 NOTE — Telephone Encounter (Signed)
Patient has not had the MRI yet.  She has been doing the home exercises and working with the RN at [facility].  The steroid pack helped for only a few weeks.  She is getting some relief from the gabapentin and wants to continue this medication as it is what seems to be helping her at the moment. She still has the numbness and aching in her left arm.  It has been 6 weeks since physician monitored conservative treatment was initiated, so I will ask for auth of MRI again for her.   I am cancelling this morning's appt. Please advise if any further needed at this time?  Thanks.

## 2021-02-20 NOTE — Telephone Encounter (Signed)
-----   Message from Vickki Hearing, MD sent at 02/19/2021  3:51 PM EDT ----- Does she have mri or CT yet

## 2021-02-24 ENCOUNTER — Other Ambulatory Visit: Payer: Self-pay | Admitting: Orthopedic Surgery

## 2021-02-24 DIAGNOSIS — M792 Neuralgia and neuritis, unspecified: Secondary | ICD-10-CM

## 2021-02-24 DIAGNOSIS — M542 Cervicalgia: Secondary | ICD-10-CM

## 2021-02-24 NOTE — Telephone Encounter (Signed)
Patient called stating that her MRI is not scheduled until 03/05/21.  She has follow up appointment on the 15th.    She is requesting refill on Gabapentin 100 mgs.  Qty  90  Sig: Take 2 caps every 8 hrs  [facility] [place]

## 2021-02-25 MED ORDER — GABAPENTIN 100 MG PO CAPS: ORAL_CAPSULE | ORAL | 1 refills | Status: DC

## 2021-03-05 ENCOUNTER — Ambulatory Visit (HOSPITAL_COMMUNITY)
Admission: RE | Admit: 2021-03-05 | Discharge: 2021-03-05 | Disposition: A | Discharge status: Home / Self Care | Payer: 59 | Source: Ambulatory Visit | Attending: Orthopedic Surgery | Admitting: Orthopedic Surgery

## 2021-03-05 ENCOUNTER — Other Ambulatory Visit: Payer: Self-pay

## 2021-03-05 DIAGNOSIS — M792 Neuralgia and neuritis, unspecified: Secondary | ICD-10-CM | POA: Diagnosis present

## 2021-03-05 DIAGNOSIS — M542 Cervicalgia: Secondary | ICD-10-CM | POA: Diagnosis not present

## 2021-03-10 ENCOUNTER — Ambulatory Visit: Payer: 59 | Admitting: Orthopedic Surgery

## 2021-03-14 ENCOUNTER — Other Ambulatory Visit: Payer: Self-pay | Admitting: Orthopedic Surgery

## 2021-03-14 DIAGNOSIS — M792 Neuralgia and neuritis, unspecified: Secondary | ICD-10-CM

## 2021-03-14 DIAGNOSIS — M542 Cervicalgia: Secondary | ICD-10-CM

## 2021-03-14 NOTE — Telephone Encounter (Signed)
Patient request refill for pain medicine   gabapentin (NEURONTIN) 100 MG capsule   [facility] [place]

## 2021-03-17 MED ORDER — GABAPENTIN 100 MG PO CAPS: ORAL_CAPSULE | ORAL | 1 refills | Status: DC

## 2021-04-03 ENCOUNTER — Ambulatory Visit (INDEPENDENT_AMBULATORY_CARE_PROVIDER_SITE_OTHER): Payer: 59 | Admitting: Orthopedic Surgery

## 2021-04-03 ENCOUNTER — Other Ambulatory Visit: Payer: Self-pay

## 2021-04-03 ENCOUNTER — Encounter: Payer: Self-pay | Admitting: Orthopedic Surgery

## 2021-04-03 VITALS — BP 135/77 | HR 78 | Ht 62.0 in | Wt 188.0 lb

## 2021-04-03 DIAGNOSIS — M4802 Spinal stenosis, cervical region: Secondary | ICD-10-CM

## 2021-04-03 DIAGNOSIS — M792 Neuralgia and neuritis, unspecified: Secondary | ICD-10-CM

## 2021-04-03 NOTE — Progress Notes (Signed)
Chief Complaint  Patient presents with   Results    Review MRI scan c spine     Mary Jarvis comes in today to review her MRI she still having pain in the cervical spine radiating pain in the left upper extremity and she started to drop things despite being on gabapentin and undergoing a full course of physical therapy.  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   EXAM: MRI CERVICAL SPINE WITHOUT CONTRAST   TECHNIQUE: Multiplanar, multisequence MR imaging of the cervical spine was performed. No intravenous contrast was administered.   COMPARISON:  Radiograph from 12/25/2020.   FINDINGS: Alignment: Straightening of the normal cervical lordosis. Trace anterolisthesis of T1 on T2 and T2 on T3.   Vertebrae: Vertebral body height maintained without acute or chronic fracture. Bone marrow signal intensity within normal limits. No discrete or worrisome osseous lesions. No abnormal marrow edema.   Cord: Normal signal morphology.   Posterior Fossa, vertebral arteries, paraspinal tissues: Visualized brain and posterior fossa within normal limits. Craniocervical junction normal. Paraspinous and prevertebral soft tissues within normal limits. Normal intravascular flow voids seen within the vertebral arteries bilaterally.   Disc levels:   C2-C3: Minimal disc bulge with left-sided facet hypertrophy. No spinal stenosis. Foramina remain patent.   C3-C4: Diffuse disc bulge with left greater than right uncovertebral spurring. Posterior disc osteophyte flattens and partially faces  the ventral thecal sac with borderline mild spinal stenosis. No cord impingement. Severe left with moderate right C4 foraminal stenosis.   C4-C5: Degenerative intervertebral disc space narrowing with diffuse disc osteophyte complex. Broad posterior component flattens and partially effaces the ventral thecal sac. Superimposed small soft disc protrusion (series 9, image 12). Mild spinal stenosis with mild cord flattening. Foramina remain patent.   C5-C6: Degenerative intervertebral disc space narrowing with diffuse disc osteophyte complex. Flattening and partial effacement of the ventral thecal sac with resultant mild spinal stenosis. Minimal cord flattening without cord signal changes. Moderate right worse than left C6 foraminal narrowing.   C6-C7: Degenerative intervertebral disc space narrowing with diffuse disc osteophyte complex, slightly eccentric to the left. Flattening and partial effacement of the ventral thecal sac, asymmetric to the left. Mild spinal stenosis without frank cord impingement. Moderate left worse than right C7 foraminal stenosis.   C7-T1: Disc bulge with left greater than right uncovertebral spurring. Superimposed left foraminal disc protrusion (series 8, image 23). Mild facet hypertrophy. No spinal stenosis. Moderate left C8 foraminal stenosis. Right neural foramen remains patent.   Visualized upper thoracic spine demonstrates no significant finding.   IMPRESSION: 1. 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 2. Left foraminal disc protrusion at C7-T1 with resultant moderate left foraminal stenosis. Query left C8 radiculitis. 3. Additional multifactorial degenerative changes with resultant multilevel foraminal narrowing as above. Notable findings include severe left with moderate right C4 foraminal stenosis, moderate right worse than left C6 foraminal narrowing, with moderate left worse  than right C7 stenosis.     Electronically Signed   By: Rise Mu M.D.   On: 03/06/2021 05:09

## 2021-04-03 NOTE — Patient Instructions (Signed)
You have been referred to [facility] on [address] in [place], they will call you with appointment. If you have not heard anything in one week call them to schedule [phone]   

## 2021-04-08 ENCOUNTER — Other Ambulatory Visit: Payer: Self-pay | Admitting: Orthopedic Surgery

## 2021-04-08 DIAGNOSIS — M542 Cervicalgia: Secondary | ICD-10-CM

## 2021-04-08 DIAGNOSIS — M792 Neuralgia and neuritis, unspecified: Secondary | ICD-10-CM

## 2021-04-08 MED ORDER — GABAPENTIN 100 MG PO CAPS: ORAL_CAPSULE | ORAL | 1 refills | Status: DC

## 2021-04-08 NOTE — Telephone Encounter (Signed)
Patient request prescription for Gabapentin 100 mgs. Qty 90  Refill 1   Sig: Take 2 caps every 8 hrs  [facility] [place]

## 2021-04-30 ENCOUNTER — Other Ambulatory Visit: Payer: Self-pay | Admitting: *Deleted

## 2021-04-30 DIAGNOSIS — M542 Cervicalgia: Secondary | ICD-10-CM

## 2021-04-30 DIAGNOSIS — M792 Neuralgia and neuritis, unspecified: Secondary | ICD-10-CM

## 2021-04-30 NOTE — Telephone Encounter (Signed)
Patient called requesting refill on Gabapentin.   [facility] [place]

## 2021-05-01 MED ORDER — GABAPENTIN 100 MG PO CAPS
ORAL_CAPSULE | ORAL | 1 refills | Status: DC
Start: 2021-05-01 — End: 2024-01-15

## 2021-05-21 ENCOUNTER — Telehealth: Payer: Self-pay | Admitting: Orthopedic Surgery

## 2021-05-21 NOTE — Telephone Encounter (Signed)
Patient called to request note to be out of work for dates 05/20/21 and 05/21/21 (today) - unable to work due to having pain. States awaiting to be scheduled for referral appointment at [facility]. Please advise.

## 2021-05-22 ENCOUNTER — Encounter: Payer: Self-pay | Admitting: Orthopedic Surgery

## 2021-05-22 NOTE — Telephone Encounter (Signed)
Called patient to notify; aware note ready for pick up.

## 2021-07-02 ENCOUNTER — Other Ambulatory Visit: Payer: Self-pay | Admitting: Student

## 2021-11-17 ENCOUNTER — Emergency Department (HOSPITAL_COMMUNITY): Payer: 59

## 2021-11-17 ENCOUNTER — Other Ambulatory Visit: Payer: Self-pay

## 2021-11-17 ENCOUNTER — Emergency Department (HOSPITAL_COMMUNITY)
Admission: EM | Admit: 2021-11-17 | Discharge: 2021-11-17 | Disposition: A | Discharge status: Home / Self Care | Payer: 59 | Attending: Emergency Medicine | Admitting: Emergency Medicine

## 2021-11-17 ENCOUNTER — Encounter (HOSPITAL_COMMUNITY): Payer: Self-pay | Admitting: *Deleted

## 2021-11-17 DIAGNOSIS — Z79899 Other long term (current) drug therapy: Secondary | ICD-10-CM | POA: Diagnosis not present

## 2021-11-17 DIAGNOSIS — I10 Essential (primary) hypertension: Secondary | ICD-10-CM | POA: Diagnosis not present

## 2021-11-17 DIAGNOSIS — R600 Localized edema: Secondary | ICD-10-CM | POA: Diagnosis not present

## 2021-11-17 DIAGNOSIS — D649 Anemia, unspecified: Secondary | ICD-10-CM | POA: Diagnosis not present

## 2021-11-17 DIAGNOSIS — R1031 Right lower quadrant pain: Secondary | ICD-10-CM | POA: Insufficient documentation

## 2021-11-17 DIAGNOSIS — Z87442 Personal history of urinary calculi: Secondary | ICD-10-CM | POA: Insufficient documentation

## 2021-11-17 DIAGNOSIS — R609 Edema, unspecified: Secondary | ICD-10-CM

## 2021-11-17 LAB — URINALYSIS, ROUTINE W REFLEX MICROSCOPIC
Bilirubin Urine: NEGATIVE
Glucose, UA: NEGATIVE mg/dL
Hgb urine dipstick: NEGATIVE
Ketones, ur: NEGATIVE mg/dL
Leukocytes,Ua: NEGATIVE
Nitrite: NEGATIVE
Protein, ur: NEGATIVE mg/dL
Specific Gravity, Urine: 1.005 (ref 1.005–1.030)
pH: 9 — ABNORMAL HIGH (ref 5.0–8.0)

## 2021-11-17 LAB — COMPREHENSIVE METABOLIC PANEL
ALT: 18 U/L (ref 0–44)
AST: 22 U/L (ref 15–41)
Albumin: 3.6 g/dL (ref 3.5–5.0)
Alkaline Phosphatase: 70 U/L (ref 38–126)
Anion gap: 5 (ref 5–15)
BUN: 9 mg/dL (ref 6–20)
CO2: 29 mmol/L (ref 22–32)
Calcium: 8.7 mg/dL — ABNORMAL LOW (ref 8.9–10.3)
Chloride: 103 mmol/L (ref 98–111)
Creatinine, Ser: 0.73 mg/dL (ref 0.44–1.00)
GFR, Estimated: >60 mL/min (ref >60)
Glucose, Bld: 121 mg/dL — ABNORMAL HIGH (ref 70–99)
Potassium: 3.7 mmol/L (ref 3.5–5.1)
Sodium: 137 mmol/L (ref 135–145)
Total Bilirubin: 0.1 mg/dL — ABNORMAL LOW (ref 0.3–1.2)
Total Protein: 7 g/dL (ref 6.5–8.1)

## 2021-11-17 LAB — CBC WITH DIFFERENTIAL/PLATELET
Abs Immature Granulocytes: 0.01 10*3/uL (ref 0.00–0.07)
Basophils Absolute: 0.1 10*3/uL (ref 0.0–0.1)
Basophils Relative: 1 %
Eosinophils Absolute: 0.1 10*3/uL (ref 0.0–0.5)
Eosinophils Relative: 2 %
HCT: 32.1 % — ABNORMAL LOW (ref 36.0–46.0)
Hemoglobin: 9.4 g/dL — ABNORMAL LOW (ref 12.0–15.0)
Immature Granulocytes: 0 %
Lymphocytes Relative: 30 %
Lymphs Abs: 1.7 10*3/uL (ref 0.7–4.0)
MCH: 21.7 pg — ABNORMAL LOW (ref 26.0–34.0)
MCHC: 29.3 g/dL — ABNORMAL LOW (ref 30.0–36.0)
MCV: 74 fL — ABNORMAL LOW (ref 80.0–100.0)
Monocytes Absolute: 0.4 10*3/uL (ref 0.1–1.0)
Monocytes Relative: 7 %
Neutro Abs: 3.4 10*3/uL (ref 1.7–7.7)
Neutrophils Relative %: 60 %
Platelets: 242 10*3/uL (ref 150–400)
RBC: 4.34 MIL/uL (ref 3.87–5.11)
RDW: 20.5 % — ABNORMAL HIGH (ref 11.5–15.5)
WBC: 5.7 10*3/uL (ref 4.0–10.5)
nRBC: 0 % (ref 0.0–0.2)

## 2021-11-17 LAB — PREGNANCY, URINE: Preg Test, Ur: NEGATIVE

## 2021-11-17 LAB — TSH: TSH: 1.261 u[IU]/mL (ref 0.350–4.500)

## 2021-11-17 MED ORDER — METHOCARBAMOL 500 MG PO TABS: 500.0000 mg | ORAL_TABLET | Freq: Three times a day (TID) | ORAL | 0 refills | Status: DC | PRN

## 2021-11-17 MED ORDER — MORPHINE SULFATE (PF) 4 MG/ML IV SOLN
4.0000 mg | Freq: Once | INTRAVENOUS | Status: AC
  Administered 2021-11-17: 4 mg via INTRAVENOUS
  Filled 2021-11-17: qty 1

## 2021-11-17 MED ORDER — ONDANSETRON HCL 4 MG/2ML IJ SOLN
4.0000 mg | Freq: Once | INTRAMUSCULAR | Status: AC
  Administered 2021-11-17: 4 mg via INTRAVENOUS
  Filled 2021-11-17: qty 2

## 2021-11-17 MED ORDER — HYDROCHLOROTHIAZIDE 25 MG PO TABS: 25.0000 mg | ORAL_TABLET | Freq: Every day | ORAL | 0 refills | Status: DC

## 2021-11-17 MED ORDER — FERROUS SULFATE 325 (65 FE) MG PO TABS: 325.0000 mg | ORAL_TABLET | Freq: Every day | ORAL | 0 refills | Status: DC

## 2021-11-17 MED ORDER — DEXAMETHASONE SODIUM PHOSPHATE 10 MG/ML IJ SOLN
10.0000 mg | Freq: Once | INTRAMUSCULAR | Status: AC
  Administered 2021-11-17: 10 mg via INTRAVENOUS
  Filled 2021-11-17: qty 1

## 2021-11-17 MED ORDER — IOHEXOL 300 MG/ML  SOLN
100.0000 mL | Freq: Once | INTRAMUSCULAR | Status: AC | PRN
  Administered 2021-11-17: 100 mL via INTRAVENOUS

## 2021-11-17 MED ORDER — SODIUM CHLORIDE 0.9 % IV BOLUS
1000.0000 mL | Freq: Once | INTRAVENOUS | Status: AC
  Administered 2021-11-17: 1000 mL via INTRAVENOUS

## 2021-11-17 NOTE — ED Notes (Signed)
Patient verbalized understanding of discharge instructions. No sign pad available.  ?

## 2021-11-17 NOTE — ED Provider Notes (Signed)
Care of patient assumed from Dr. Particia Nearing at 4:30 PM.  This patient presents for R groin pain, worsened with walking, which is likely musculoskeletal in nature.  She currently has a CT scan of abdomen pelvis pending.  F/u on CT scan. If negative, D/C. ?Physical Exam  ?BP (!) 142/93   Pulse 94   Temp 98.4 ?F (36.9 ?C) (Oral)   Resp 16   Ht 5' (1.524 m)   Wt 88.9 kg   SpO2 97%   BMI 38.28 kg/m?  ? ?Physical Exam ?Vitals and nursing note reviewed.  ?Constitutional:   ?   General: She is not in acute distress. ?   Appearance: She is well-developed. She is not ill-appearing, toxic-appearing or diaphoretic.  ?HENT:  ?   Head: Normocephalic and atraumatic.  ?   Mouth/Throat:  ?   Mouth: Mucous membranes are moist.  ?   Pharynx: Oropharynx is clear.  ?Eyes:  ?   Conjunctiva/sclera: Conjunctivae normal.  ?   Pupils: Pupils are equal, round, and reactive to light.  ?Cardiovascular:  ?   Rate and Rhythm: Normal rate and regular rhythm.  ?   Heart sounds: No murmur heard. ?Pulmonary:  ?   Effort: Pulmonary effort is normal. No respiratory distress.  ?Abdominal:  ?   General: There is no distension.  ?   Palpations: Abdomen is soft.  ?Musculoskeletal:     ?   General: No swelling.  ?   Cervical back: Neck supple.  ?Skin: ?   General: Skin is warm and dry.  ?Neurological:  ?   General: No focal deficit present.  ?   Mental Status: She is alert and oriented to person, place, and time.  ?   Cranial Nerves: No cranial nerve deficit.  ?   Motor: No weakness.  ?Psychiatric:     ?   Mood and Affect: Mood normal.     ?   Behavior: Behavior normal.  ? ? ?Procedures  ?Procedures ? ?ED Course / MDM  ?  ?Medical Decision Making ?Amount and/or Complexity of Data Reviewed ?Labs: ordered. ?Radiology: ordered. ? ?Risk ?OTC drugs. ?Prescription drug management. ? ? ?CT scan was negative for any acute findings.  Although patient continues to have continued pain, she is ambulatory.  Robaxin prescription was provided.  She was discharged in  stable condition. ? ? ? ? ?  ?Gloris Manchester, MD ?11/18/21 1306 ? ?

## 2021-11-17 NOTE — Discharge Instructions (Addendum)
Continue Tylenol for pain.  Avoid excessive use of Goody's powder as this can cause stomach bleeding.  There is a prescription sent for a muscle relaxer to take as needed to help with pain as well.  Follow-up with your primary care doctor. ?

## 2021-11-17 NOTE — ED Triage Notes (Signed)
Pt c/o right lower quadrant pain x 4 days ago; pt denies any n/v/d; pt states the pain is worse when she walks; pt has urgency ?

## 2021-11-17 NOTE — ED Provider Notes (Signed)
?[facility] EMERGENCY DEPARTMENT ?Provider Note ? ? ?CSN: [id number] ?Arrival date & time: 11/17/21  1157 ? ?  ? ?History ? ?Chief Complaint  ?Patient presents with  ? Abdominal Pain  ? ? ?Mary Jarvis is a 46 y.o. female. ? ?Pt is a 46 yo female with pmhx significant for htn, ulcers, hep c, hyperlipidemia, anxiety, kidney stones, chronic pain and seizures.  She said she has had some pain in her "groin fold" for 4 days.  Pt said it hurts mainly when she walks.  She denies any f/c.  She has some leg swelling, but no other sx. ? ? ?  ? ?Home Medications ?Prior to Admission medications   ?Medication Sig Start Date End Date Taking? Authorizing Provider  ?albuterol (PROVENTIL) (2.5 MG/3ML) 0.083% nebulizer solution Take 3 mLs (2.5 mg total) by nebulization every 6 (six) hours as needed for wheezing or shortness of breath. 02/28/19  Yes Maxwell Caul, PA-C  ?albuterol (VENTOLIN HFA) 108 (90 Base) MCG/ACT inhaler Inhale 1-2 puffs into the lungs every 6 (six) hours as needed for wheezing or shortness of breath. 02/28/19  Yes Maxwell Caul, PA-C  ?buprenorphine-naloxone (SUBOXONE) 8-2 mg SUBL SL tablet Place 1 tablet under the tongue 3 (three) times daily. 12/06/20  Yes [provider]  ?DULoxetine (CYMBALTA) 60 MG capsule Take 60 mg by mouth daily. 11/12/21  Yes [provider]  ?ferrous sulfate 325 (65 FE) MG tablet Take 1 tablet (325 mg total) by mouth daily. 11/17/21  Yes Jacalyn Lefevre, MD  ?gabapentin (NEURONTIN) 400 MG capsule Take 400 mg by mouth 4 (four) times daily. 11/07/21  Yes [provider]  ?hydrochlorothiazide (HYDRODIURIL) 25 MG tablet Take 1 tablet (25 mg total) by mouth daily. 11/17/21  Yes Jacalyn Lefevre, MD  ?levETIRAcetam (KEPPRA XR) 500 MG 24 hr tablet Take 500 mg by mouth 2 (two) times daily. 01/31/21  Yes [provider]  ?liraglutide (VICTOZA) 18 MG/3ML SOPN Inject into the skin as directed.   Yes [provider]  ?trolamine salicylate (ASPERCREME) 10 %  cream Apply 1 application topically as needed for muscle pain.   Yes [provider]  ?gabapentin (NEURONTIN) 100 MG capsule Take 2 caps every 8 hrs ?Patient not taking: Reported on 11/17/2021 05/01/21   Vickki Hearing, MD  ?pantoprazole (PROTONIX) 20 MG tablet Take 1 tablet (20 mg total) by mouth daily. ?Patient not taking: Reported on 11/17/2021 01/12/19   Loren Racer, MD  ?   ? ?Allergies    ?Aspirin, Ibuprofen, Tramadol, and Ancef [cefazolin]   ? ?Review of Systems   ?Review of Systems  ?Cardiovascular:  Positive for leg swelling.  ?Gastrointestinal:  Positive for abdominal pain.  ? ?Physical Exam ?Updated Vital Signs ?BP 134/77   Pulse 68   Temp 98.4 ?F (36.9 ?C) (Oral)   Resp 14   Ht 5' (1.524 m)   Wt 88.9 kg   SpO2 98%   BMI 38.28 kg/m?  ?Physical Exam ?Vitals and nursing note reviewed.  ?Constitutional:   ?   Appearance: She is well-developed. She is obese.  ?HENT:  ?   Head: Normocephalic and atraumatic.  ?   Mouth/Throat:  ?   Mouth: Mucous membranes are moist.  ?Eyes:  ?   Extraocular Movements: Extraocular movements intact.  ?   Pupils: Pupils are equal, round, and reactive to light.  ?Cardiovascular:  ?   Rate and Rhythm: Normal rate and regular rhythm.  ?   Heart sounds: Normal heart sounds.  ?Pulmonary:  ?  Effort: Pulmonary effort is normal.  ?   Breath sounds: Normal breath sounds.  ?Abdominal:  ?   General: Abdomen is flat. Bowel sounds are normal.  ?   Palpations: Abdomen is soft.  ?   Tenderness: There is abdominal tenderness in the right lower quadrant.  ?Musculoskeletal:  ?   Right lower leg: Edema present.  ?   Left lower leg: Edema present.  ?Skin: ?   General: Skin is warm.  ?   Capillary Refill: Capillary refill takes less than 2 seconds.  ?Neurological:  ?   General: No focal deficit present.  ?   Mental Status: She is alert and oriented to person, place, and time.  ?Psychiatric:     ?   Mood and Affect: Mood normal.     ?   Behavior: Behavior normal.  ? ? ?ED  Results / Procedures / Treatments   ?Labs ?(all labs ordered are listed, but only abnormal results are displayed) ?Labs Reviewed  ?CBC WITH DIFFERENTIAL/PLATELET - Abnormal; Notable for the following components:  ?    Result Value  ? Hemoglobin 9.4 (*)   ? HCT 32.1 (*)   ? MCV 74.0 (*)   ? MCH 21.7 (*)   ? MCHC 29.3 (*)   ? RDW 20.5 (*)   ? All other components within normal limits  ?COMPREHENSIVE METABOLIC PANEL - Abnormal; Notable for the following components:  ? Glucose, Bld 121 (*)   ? Calcium 8.7 (*)   ? Total Bilirubin 0.1 (*)   ? All other components within normal limits  ?URINALYSIS, ROUTINE W REFLEX MICROSCOPIC - Abnormal; Notable for the following components:  ? Color, Urine STRAW (*)   ? pH 9.0 (*)   ? All other components within normal limits  ?TSH  ?PREGNANCY, URINE  ? ? ?EKG ?None ? ?Radiology ?No results found. ? ?Procedures ?Procedures  ? ? ?Medications Ordered in ED ?Medications  ?dexamethasone (DECADRON) injection 10 mg (10 mg Intravenous Given 11/17/21 1513)  ?morphine (PF) 4 MG/ML injection 4 mg (4 mg Intravenous Given 11/17/21 1515)  ?ondansetron [facility]) injection 4 mg (4 mg Intravenous Given 11/17/21 1512)  ?sodium chloride 0.9 % bolus 1,000 mL (1,000 mLs Intravenous New Bag/Given 11/17/21 1518)  ? ? ?ED Course/ Medical Decision Making/ A&P ?  ?                        ?Medical Decision Making ?Amount and/or Complexity of Data Reviewed ?Labs: ordered. ?Radiology: ordered. ? ?Risk ?Prescription drug management. ? ? ?This patient presents to the ED for concern of abd pain, this involves an extensive number of treatment options, and is a complaint that carries with it a high risk of complications and morbidity.  The differential diagnosis includes cholecystitis, appendicitis, uti, kidney stone ? ? ?Co morbidities that complicate the patient evaluation ? ? htn, ulcers, hep c, hyperlipidemia, anxiety, kidney stones, chronic pain and seizures ? ? ?Additional history obtained: ? ?Additional history  obtained from epic chart review ? ? ? ?Lab Tests: ? ?I Ordered, and personally interpreted labs.  The pertinent results include:  cbc with anemia (hgb 9.4), cmp nl, ua nl ? ? ?Imaging Studies ordered: ? ?I ordered imaging studies including ct abd/pelvis which is pending at shift change ? ?Cardiac Monitoring: ? ?The patient was maintained on a cardiac monitor.  I personally viewed and interpreted the cardiac monitored which showed an underlying rhythm of: nsr ? ? ?Medicines ordered and prescription drug  management: ? ?I ordered medication including morphine, decadron, zofran  for pain and nausea  ?Reevaluation of the patient after these medicines showed that the patient improved ?I have reviewed the patients home medicines and have made adjustments as needed ? ? ?Test Considered: ? ?Ct abd/pelvis ? ? ?Critical Interventions: ? ?Pain control ? ? ?Problem List / ED Course: ? ?Abd pain:  labs nl.  Pt is afebrile.  CT is pending.  Pt signed out to Dr. Durwin Noraixon. ?Peripheral edema:  I will start pt on hctz ?Anemia:  I will start pt on iron.  She needs to f/u with gi. ? ? ? ? ? ? ? ?Final Clinical Impression(s) / ED Diagnoses ?Final diagnoses:  ?Right lower quadrant abdominal pain  ?Peripheral edema  ?Anemia, unspecified type  ? ? ?Rx / DC Orders ?ED Discharge Orders   ? ?      Ordered  ?  hydrochlorothiazide (HYDRODIURIL) 25 MG tablet  Daily       ? 11/17/21 1650  ?  ferrous sulfate 325 (65 FE) MG tablet  Daily       ? 11/17/21 1650  ? ?  ?  ? ?  ? ? ?  ?Jacalyn LefevreHaviland, Deniece Rankin, MD ?11/17/21 1650 ? ?

## 2022-01-05 ENCOUNTER — Emergency Department (HOSPITAL_COMMUNITY)
Admission: EM | Admit: 2022-01-05 | Discharge: 2022-01-05 | Discharge status: Against Medical Advice | Payer: Self-pay | Attending: Emergency Medicine | Admitting: Emergency Medicine

## 2022-01-05 ENCOUNTER — Emergency Department (HOSPITAL_COMMUNITY): Payer: Self-pay

## 2022-01-05 ENCOUNTER — Encounter (HOSPITAL_COMMUNITY): Payer: Self-pay | Admitting: Emergency Medicine

## 2022-01-05 ENCOUNTER — Other Ambulatory Visit: Payer: Self-pay

## 2022-01-05 DIAGNOSIS — I10 Essential (primary) hypertension: Secondary | ICD-10-CM | POA: Insufficient documentation

## 2022-01-05 DIAGNOSIS — Z79899 Other long term (current) drug therapy: Secondary | ICD-10-CM | POA: Insufficient documentation

## 2022-01-05 DIAGNOSIS — M25551 Pain in right hip: Secondary | ICD-10-CM | POA: Insufficient documentation

## 2022-01-05 DIAGNOSIS — M545 Low back pain, unspecified: Secondary | ICD-10-CM | POA: Insufficient documentation

## 2022-01-05 DIAGNOSIS — Y93D9 Activity, other involving arts and handcrafts: Secondary | ICD-10-CM | POA: Insufficient documentation

## 2022-01-05 DIAGNOSIS — W11XXXA Fall on and from ladder, initial encounter: Secondary | ICD-10-CM | POA: Insufficient documentation

## 2022-01-05 MED ORDER — ACETAMINOPHEN 500 MG PO TABS
1000.0000 mg | ORAL_TABLET | Freq: Once | ORAL | Status: AC
  Administered 2022-01-05: 1000 mg via ORAL
  Filled 2022-01-05: qty 2

## 2022-01-05 MED ORDER — METHOCARBAMOL 500 MG PO TABS
500.0000 mg | ORAL_TABLET | Freq: Once | ORAL | Status: AC
  Administered 2022-01-05: 500 mg via ORAL
  Filled 2022-01-05: qty 1

## 2022-01-05 MED ORDER — LIDOCAINE 5 % EX PTCH
2.0000 | MEDICATED_PATCH | Freq: Once | CUTANEOUS | Status: DC
  Administered 2022-01-05: 2 via TRANSDERMAL
  Filled 2022-01-05: qty 2

## 2022-01-05 MED ORDER — GABAPENTIN 100 MG PO CAPS
200.0000 mg | ORAL_CAPSULE | Freq: Once | ORAL | Status: AC
  Administered 2022-01-05: 200 mg via ORAL
  Filled 2022-01-05: qty 2

## 2022-01-05 MED ORDER — KETOROLAC TROMETHAMINE 15 MG/ML IJ SOLN
15.0000 mg | Freq: Once | INTRAMUSCULAR | Status: AC
  Administered 2022-01-05: 15 mg via INTRAMUSCULAR
  Filled 2022-01-05: qty 1

## 2022-01-05 NOTE — ED Provider Notes (Signed)
[facility] EMERGENCY DEPARTMENT Provider Note   CSN: [id number] Arrival date & time: 01/05/22  1132     History  Chief Complaint  Patient presents with   Hip Pain    Mary Jarvis is a 46 y.o. female.  Mary Jarvis is a 46 y.o. female with a history of hypertension, hyperlipidemia, peripheral neuropathy, chronic pain syndrome, who presents to the emergency department for evaluation of pain in her right hip and back after a fall.  She reports 8 days ago she fell onto her bottom from a stepladder.  She reports since then she has had worsening pain that goes from her low back into the front of her right hip.  Pain is significantly worse with weightbearing.  She reports intermittent numbness in the leg when walking.  No associated abdominal pain, no loss of bowel or bladder function.  No numbness or weakness in the left leg.  Reports she has tried Tylenol and Goody powders at home.  Is already prescribed Robaxin and gabapentin.  Is currently on Suboxone 3 times daily.  The history is provided by the patient and medical records.  Hip Pain Pertinent negatives include no chest pain, no abdominal pain and no shortness of breath.       Home Medications Prior to Admission medications   Medication Sig Start Date End Date Taking? Authorizing Provider  albuterol (PROVENTIL) (2.5 MG/3ML) 0.083% nebulizer solution Take 3 mLs (2.5 mg total) by nebulization every 6 (six) hours as needed for wheezing or shortness of breath. 02/28/19   Volanda Napoleon, PA-C  albuterol (VENTOLIN HFA) 108 (90 Base) MCG/ACT inhaler Inhale 1-2 puffs into the lungs every 6 (six) hours as needed for wheezing or shortness of breath. 02/28/19   Volanda Napoleon, PA-C  buprenorphine-naloxone (SUBOXONE) 8-2 mg SUBL SL tablet Place 1 tablet under the tongue 3 (three) times daily. 12/06/20   [provider]  DULoxetine (CYMBALTA) 60 MG capsule Take 60 mg by mouth daily. 11/12/21   [provider]  ferrous sulfate  325 (65 FE) MG tablet Take 1 tablet (325 mg total) by mouth daily. 11/17/21   Isla Pence, MD  gabapentin (NEURONTIN) 100 MG capsule Take 2 caps every 8 hrs Patient not taking: Reported on 11/17/2021 05/01/21   Carole Civil, MD  gabapentin (NEURONTIN) 400 MG capsule Take 400 mg by mouth 4 (four) times daily. 11/07/21   [provider]  hydrochlorothiazide (HYDRODIURIL) 25 MG tablet Take 1 tablet (25 mg total) by mouth daily. 11/17/21   Isla Pence, MD  levETIRAcetam (KEPPRA XR) 500 MG 24 hr tablet Take 500 mg by mouth 2 (two) times daily. 01/31/21   [provider]  liraglutide (VICTOZA) 18 MG/3ML SOPN Inject into the skin as directed.    [provider]  methocarbamol (ROBAXIN) 500 MG tablet Take 1 tablet (500 mg total) by mouth every 8 (eight) hours as needed for muscle spasms. 11/17/21   Godfrey Pick, MD  pantoprazole (PROTONIX) 20 MG tablet Take 1 tablet (20 mg total) by mouth daily. Patient not taking: Reported on 11/17/2021 01/12/19   Julianne Rice, MD  trolamine salicylate (ASPERCREME) 10 % cream Apply 1 application topically as needed for muscle pain.    [provider]      Allergies    Aspirin, Ibuprofen, Tramadol, and Ancef [cefazolin]    Review of Systems   Review of Systems  Constitutional:  Negative for chills and fever.  HENT: Negative.    Respiratory:  Negative for shortness  of breath.   Cardiovascular:  Negative for chest pain.  Gastrointestinal:  Negative for abdominal pain, constipation, diarrhea, nausea and vomiting.  Genitourinary:  Negative for dysuria, flank pain, frequency and hematuria.  Musculoskeletal:  Positive for arthralgias and back pain. Negative for gait problem, joint swelling, myalgias and neck pain.  Skin:  Negative for color change, rash and wound.  Neurological:  Positive for numbness. Negative for weakness.    Physical Exam Updated Vital Signs BP (!) 145/106   Pulse (!) 108   Temp 97.7 F (36.5 C)  (Oral)   Resp 20   Ht 5\' 2"  (1.575 m)   Wt 85.7 kg   LMP 12/07/2021 (Approximate)   SpO2 95%   BMI 34.57 kg/m  Physical Exam Vitals and nursing note reviewed.  Constitutional:      General: She is not in acute distress.    Appearance: She is well-developed. She is not diaphoretic.  HENT:     Head: Atraumatic.  Eyes:     General:        Right eye: No discharge.        Left eye: No discharge.  Cardiovascular:     Pulses:          Radial pulses are 2+ on the right side and 2+ on the left side.       Dorsalis pedis pulses are 2+ on the right side and 2+ on the left side.       Posterior tibial pulses are 2+ on the right side and 2+ on the left side.  Pulmonary:     Effort: Pulmonary effort is normal. No respiratory distress.  Abdominal:     General: Bowel sounds are normal. There is no distension.     Palpations: Abdomen is soft. There is no mass.     Tenderness: There is no abdominal tenderness. There is no guarding.     Comments: Abdomen soft, nondistended, nontender to palpation in all quadrants without guarding or peritoneal signs, no CVA tenderness bilaterally  Musculoskeletal:     Cervical back: Neck supple.     Comments: Tenderness to palpation over right low back and right anterior hip.  Pain made worse with range of motion of the right hip and with weightbearing, but range of motion of the right hip intact.  Negative straight leg raise.  Skin:    General: Skin is warm and dry.     Capillary Refill: Capillary refill takes less than 2 seconds.  Neurological:     Mental Status: She is alert and oriented to person, place, and time.     Comments: Alert, clear speech, following commands. Moving all extremities without difficulty. Bilateral lower extremities with 5/5 strength in proximal and distal muscle groups and with dorsi and plantar flexion. Sensation intact in bilateral lower extremities. 2+ patellar DTRs bilaterally. Ambulatory with steady gait  Psychiatric:         Behavior: Behavior normal.     ED Results / Procedures / Treatments   Labs (all labs ordered are listed, but only abnormal results are displayed) Labs Reviewed - No data to display  EKG None  Radiology DG HIP UNILAT WITH PELVIS 2-3 VIEWS RIGHT  Result Date: 01/05/2022 CLINICAL DATA:  Fall, right hip pain EXAM: DG HIP (WITH OR WITHOUT PELVIS) 2-3V RIGHT COMPARISON:  None Available. FINDINGS: There is no evidence of displaced hip fracture or dislocation. There is no evidence of arthropathy or other focal bone abnormality. Nonobstructive pattern of overlying bowel gas. IMPRESSION:  No displaced fracture or dislocation of the right hip or pelvis and proximal left femur seen in single frontal view. Electronically Signed   By: Delanna Ahmadi M.D.   On: 01/05/2022 12:35    Procedures Procedures    Medications Ordered in ED Medications  ketorolac (TORADOL) 15 MG/ML injection 15 mg (15 mg Intramuscular Given 01/05/22 1426)  methocarbamol (ROBAXIN) tablet 500 mg (500 mg Oral Given 01/05/22 1421)  gabapentin (NEURONTIN) capsule 200 mg (200 mg Oral Given 01/05/22 1436)  acetaminophen (TYLENOL) tablet 1,000 mg (1,000 mg Oral Given 01/05/22 1421)    ED Course/ Medical Decision Making/ A&P                           Medical Decision Making Amount and/or Complexity of Data Reviewed Radiology: ordered.  Risk OTC drugs. Prescription drug management.   Patient presents with pain in the low back and right hip after a fall 8 days ago.  On exam she appears very uncomfortable.  History of chronic pain and prior back issues.  Currently on Suboxone.  On arrival patient is tachycardic and hypertensive.  No fever or infectious symptoms.  Patient is neurovascularly intact on exam.  X-rays of the right hip reviewed and interpreted by me without evidence for fracture or dislocation, but patient with continued severe pain on weightbearing.  Pain treated in the ED with Tylenol, Toradol, gabapentin and Robaxin.   Plan to get CTs of the right hip and lumbar spine.  Notified by nursing staff that patient reported she had to leave to pick up her grandchildren and was not able to stay for further evaluation.  Patient eloped from the department before I could discuss this with her.        Final Clinical Impression(s) / ED Diagnoses Final diagnoses:  Right hip pain    Rx / DC Orders ED Discharge Orders     None         Janet Berlin 01/05/22 2351    Godfrey Pick, MD 01/07/22 [phone]

## 2022-01-05 NOTE — ED Notes (Signed)
CT came to get pt; pt refused; pt said she has to leave to pick up her grandchild or they will call DSS. Pt leaving AMA, signed

## 2022-01-05 NOTE — ED Triage Notes (Signed)
Pt fell from step ladder while painting approx 8 days ago, currently having right hip pain.

## 2022-03-05 DIAGNOSIS — Z1389 Encounter for screening for other disorder: Secondary | ICD-10-CM | POA: Diagnosis not present

## 2022-03-05 DIAGNOSIS — F411 Generalized anxiety disorder: Secondary | ICD-10-CM | POA: Diagnosis not present

## 2022-03-05 DIAGNOSIS — Z0000 Encounter for general adult medical examination without abnormal findings: Secondary | ICD-10-CM | POA: Diagnosis not present

## 2022-03-05 DIAGNOSIS — R69 Illness, unspecified: Secondary | ICD-10-CM | POA: Diagnosis not present

## 2022-04-02 DIAGNOSIS — R69 Illness, unspecified: Secondary | ICD-10-CM | POA: Diagnosis not present

## 2022-04-02 DIAGNOSIS — M5116 Intervertebral disc disorders with radiculopathy, lumbar region: Secondary | ICD-10-CM | POA: Diagnosis not present

## 2022-04-25 NOTE — Progress Notes (Signed)
No chief complaint on file.

## 2022-04-30 DIAGNOSIS — F411 Generalized anxiety disorder: Secondary | ICD-10-CM | POA: Diagnosis not present

## 2022-04-30 DIAGNOSIS — R69 Illness, unspecified: Secondary | ICD-10-CM | POA: Diagnosis not present

## 2022-05-04 ENCOUNTER — Encounter (HOSPITAL_COMMUNITY): Payer: Self-pay | Admitting: Emergency Medicine

## 2022-05-04 ENCOUNTER — Other Ambulatory Visit: Payer: Self-pay

## 2022-05-04 ENCOUNTER — Emergency Department (HOSPITAL_COMMUNITY): Payer: 59

## 2022-05-04 ENCOUNTER — Emergency Department (HOSPITAL_COMMUNITY)
Admission: EM | Admit: 2022-05-04 | Discharge: 2022-05-04 | Disposition: A | Discharge status: Home / Self Care | Payer: 59 | Attending: Emergency Medicine | Admitting: Emergency Medicine

## 2022-05-04 DIAGNOSIS — Z794 Long term (current) use of insulin: Secondary | ICD-10-CM | POA: Insufficient documentation

## 2022-05-04 DIAGNOSIS — R0602 Shortness of breath: Secondary | ICD-10-CM | POA: Diagnosis not present

## 2022-05-04 DIAGNOSIS — D72829 Elevated white blood cell count, unspecified: Secondary | ICD-10-CM | POA: Diagnosis not present

## 2022-05-04 DIAGNOSIS — R000 Tachycardia, unspecified: Secondary | ICD-10-CM | POA: Diagnosis not present

## 2022-05-04 DIAGNOSIS — R220 Localized swelling, mass and lump, head: Secondary | ICD-10-CM | POA: Diagnosis not present

## 2022-05-04 DIAGNOSIS — I10 Essential (primary) hypertension: Secondary | ICD-10-CM | POA: Insufficient documentation

## 2022-05-04 DIAGNOSIS — Z79899 Other long term (current) drug therapy: Secondary | ICD-10-CM | POA: Diagnosis not present

## 2022-05-04 DIAGNOSIS — L03211 Cellulitis of face: Secondary | ICD-10-CM | POA: Diagnosis not present

## 2022-05-04 LAB — BASIC METABOLIC PANEL
Anion gap: 10 (ref 5–15)
BUN: 11 mg/dL (ref 6–20)
CO2: 26 mmol/L (ref 22–32)
Calcium: 8.4 mg/dL — ABNORMAL LOW (ref 8.9–10.3)
Chloride: 100 mmol/L (ref 98–111)
Creatinine, Ser: 0.68 mg/dL (ref 0.44–1.00)
GFR, Estimated: >60 mL/min (ref >60)
Glucose, Bld: 106 mg/dL — ABNORMAL HIGH (ref 70–99)
Potassium: 3.2 mmol/L — ABNORMAL LOW (ref 3.5–5.1)
Sodium: 136 mmol/L (ref 135–145)

## 2022-05-04 LAB — CBC WITH DIFFERENTIAL/PLATELET
Abs Immature Granulocytes: 0.09 10*3/uL — ABNORMAL HIGH (ref 0.00–0.07)
Basophils Absolute: 0.1 10*3/uL (ref 0.0–0.1)
Basophils Relative: 0 %
Eosinophils Absolute: 0.1 10*3/uL (ref 0.0–0.5)
Eosinophils Relative: 1 %
HCT: 31.1 % — ABNORMAL LOW (ref 36.0–46.0)
Hemoglobin: 9.2 g/dL — ABNORMAL LOW (ref 12.0–15.0)
Immature Granulocytes: 1 %
Lymphocytes Relative: 26 %
Lymphs Abs: 3.7 10*3/uL (ref 0.7–4.0)
MCH: 22.3 pg — ABNORMAL LOW (ref 26.0–34.0)
MCHC: 29.6 g/dL — ABNORMAL LOW (ref 30.0–36.0)
MCV: 75.3 fL — ABNORMAL LOW (ref 80.0–100.0)
Monocytes Absolute: 0.9 10*3/uL (ref 0.1–1.0)
Monocytes Relative: 6 %
Neutro Abs: 9.5 10*3/uL — ABNORMAL HIGH (ref 1.7–7.7)
Neutrophils Relative %: 66 %
Platelets: 307 10*3/uL (ref 150–400)
RBC: 4.13 MIL/uL (ref 3.87–5.11)
RDW: 20.2 % — ABNORMAL HIGH (ref 11.5–15.5)
WBC: 14.3 10*3/uL — ABNORMAL HIGH (ref 4.0–10.5)
nRBC: 0 % (ref 0.0–0.2)

## 2022-05-04 LAB — HCG, QUANTITATIVE, PREGNANCY: hCG, Beta Chain, Quant, S: <1 m[IU]/mL (ref <5)

## 2022-05-04 MED ORDER — DOXYCYCLINE HYCLATE 100 MG PO CAPS: 100.0000 mg | ORAL_CAPSULE | Freq: Two times a day (BID) | ORAL | 0 refills | Status: DC

## 2022-05-04 MED ORDER — HYDROMORPHONE HCL 1 MG/ML IJ SOLN: 1.0000 mg | Freq: Once | INTRAMUSCULAR | Status: DC

## 2022-05-04 MED ORDER — DIPHENHYDRAMINE HCL 50 MG/ML IJ SOLN
25.0000 mg | Freq: Once | INTRAMUSCULAR | Status: AC
  Administered 2022-05-04: 25 mg via INTRAVENOUS
  Filled 2022-05-04: qty 1

## 2022-05-04 MED ORDER — IOHEXOL 300 MG/ML  SOLN
75.0000 mL | Freq: Once | INTRAMUSCULAR | Status: AC | PRN
  Administered 2022-05-04: 75 mL via INTRAVENOUS

## 2022-05-04 MED ORDER — HYDROCODONE-ACETAMINOPHEN 5-325 MG PO TABS: 1.0000 | ORAL_TABLET | ORAL | 0 refills | Status: DC | PRN

## 2022-05-04 MED ORDER — METRONIDAZOLE 500 MG/100ML IV SOLN
500.0000 mg | Freq: Two times a day (BID) | INTRAVENOUS | Status: DC
  Administered 2022-05-04: 500 mg via INTRAVENOUS
  Filled 2022-05-04: qty 100

## 2022-05-04 MED ORDER — ONDANSETRON HCL 4 MG/2ML IJ SOLN: 4.0000 mg | Freq: Once | INTRAMUSCULAR | Status: DC

## 2022-05-04 MED ORDER — LEVOFLOXACIN IN D5W 750 MG/150ML IV SOLN
750.0000 mg | Freq: Once | INTRAVENOUS | Status: AC
  Administered 2022-05-04: 750 mg via INTRAVENOUS
  Filled 2022-05-04: qty 150

## 2022-05-04 MED ORDER — ACETAMINOPHEN 325 MG PO TABS
650.0000 mg | ORAL_TABLET | Freq: Once | ORAL | Status: AC
  Administered 2022-05-04: 650 mg via ORAL
  Filled 2022-05-04: qty 2

## 2022-05-04 MED ORDER — ALBUTEROL SULFATE (2.5 MG/3ML) 0.083% IN NEBU
5.0000 mg | INHALATION_SOLUTION | Freq: Once | RESPIRATORY_TRACT | Status: AC
Start: 2022-05-04 — End: 2022-05-04
  Administered 2022-05-04: 5 mg via RESPIRATORY_TRACT
  Filled 2022-05-04: qty 6

## 2022-05-04 MED ORDER — EPINEPHRINE 0.3 MG/0.3ML IJ SOAJ
INTRAMUSCULAR | Status: AC
Start: 2022-05-04 — End: 2022-05-04
  Filled 2022-05-04: qty 0.3

## 2022-05-04 NOTE — ED Triage Notes (Addendum)
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

## 2022-05-04 NOTE — ED Provider Notes (Signed)
[facility] EMERGENCY DEPARTMENT Provider Note   CSN: [id number] Arrival date & time: 05/04/22  0532     History  Chief Complaint  Patient presents with   Allergic Reaction    Mary Jarvis is a 46 y.o. female.  The history is provided by the patient.  Patient with history of chronic pain, hypertension presents with facial swelling.  Patient is accompanied by the significant other.  It is reported that patient had a "back injection"on Thursday, October 5.  She was also started on prednisone for back pain. She reports on the following day she started having mild swelling noted to her lower lip. Earlier tonight she went to bed and it felt worse, and she later woke up with even worse swelling and difficulty breathing.  No rash, no vomiting or diarrhea.  No chest pain.  She has never had this type of reaction before.  She is not on an ACE inhibitor.  She reports environmental allergies, but no medication allergies.     Past Medical History:  Diagnosis Date   Chronic pain syndrome    Gastric ulcer    Generalized anxiety disorder    Hepatitis C    Hyperlipidemia    Hypertension    Kidney stones    Liver failure (HCC)    Peripheral neuropathy    Seizures (HCC)    Tachycardia     Home Medications Prior to Admission medications   Medication Sig Start Date End Date Taking? Authorizing Provider  albuterol (PROVENTIL) (2.5 MG/3ML) 0.083% nebulizer solution Take 3 mLs (2.5 mg total) by nebulization every 6 (six) hours as needed for wheezing or shortness of breath. 02/28/19   Maxwell Caul, PA-C  albuterol (VENTOLIN HFA) 108 (90 Base) MCG/ACT inhaler Inhale 1-2 puffs into the lungs every 6 (six) hours as needed for wheezing or shortness of breath. 02/28/19   Maxwell Caul, PA-C  buprenorphine-naloxone (SUBOXONE) 8-2 mg SUBL SL tablet Place 1 tablet under the tongue 3 (three) times daily. 12/06/20   [provider]  DULoxetine (CYMBALTA) 60 MG capsule Take 60 mg by mouth  daily. 11/12/21   [provider]  ferrous sulfate 325 (65 FE) MG tablet Take 1 tablet (325 mg total) by mouth daily. 11/17/21   Jacalyn Lefevre, MD  gabapentin (NEURONTIN) 100 MG capsule Take 2 caps every 8 hrs Patient not taking: Reported on 11/17/2021 05/01/21   Vickki Hearing, MD  gabapentin (NEURONTIN) 400 MG capsule Take 400 mg by mouth 4 (four) times daily. 11/07/21   [provider]  hydrochlorothiazide (HYDRODIURIL) 25 MG tablet Take 1 tablet (25 mg total) by mouth daily. 11/17/21   Jacalyn Lefevre, MD  levETIRAcetam (KEPPRA XR) 500 MG 24 hr tablet Take 500 mg by mouth 2 (two) times daily. 01/31/21   [provider]  liraglutide (VICTOZA) 18 MG/3ML SOPN Inject into the skin as directed.    [provider]  methocarbamol (ROBAXIN) 500 MG tablet Take 1 tablet (500 mg total) by mouth every 8 (eight) hours as needed for muscle spasms. 11/17/21   Gloris Manchester, MD  pantoprazole (PROTONIX) 20 MG tablet Take 1 tablet (20 mg total) by mouth daily. Patient not taking: Reported on 11/17/2021 01/12/19   Loren Racer, MD  trolamine salicylate (ASPERCREME) 10 % cream Apply 1 application topically as needed for muscle pain.    [provider]      Allergies    Aspirin, Ibuprofen, Tramadol, Ancef [cefazolin], and Bee venom    Review of Systems  Review of Systems  HENT:  Positive for facial swelling.   Respiratory:  Positive for shortness of breath.   Cardiovascular:  Negative for chest pain.  Gastrointestinal:  Negative for diarrhea and vomiting.    Physical Exam Updated Vital Signs BP 124/73   Pulse (!) 120   Temp 100 F (37.8 C) (Oral)   Resp (!) 25   Ht 1.575 m (5\' 2" )   Wt 75.8 kg   LMP  (LMP Unknown)   SpO2 95%   BMI 30.54 kg/m  Physical Exam CONSTITUTIONAL: Disheveled and anxious HEAD: Normocephalic/atraumatic EYES: EOMI/PERRL ENMT: Mucous membranes moist Right lower lip is edematous and tender to palpation.  There is erythema that  extends into her cheek.  She has poor dentition, but no dental pain.  No obvious gingival abscess.  Floor of mouth is soft.  The tongue is normal appearance.  Uvula is midline.  There is no stridor or drooling.  She has tenderness and swelling to the right submandibular region NECK: supple no meningeal signs CV: S1/S2 noted, tachycardic LUNGS: Lungs are clear to auscultation bilaterally, no apparent distress ABDOMEN: soft, nontender GU:no cva tenderness NEURO: Pt is awake/alert/appropriate, moves all extremitiesx4.  No facial droop.   EXTREMITIES: full ROM SKIN: warm, color normal PSYCH: Anxious    Patient gave verbal permission to utilize photo for medical documentation only The image was not stored on any personal device ED Results / Procedures / Treatments   Labs (all labs ordered are listed, but only abnormal results are displayed) Labs Reviewed  CBC WITH DIFFERENTIAL/PLATELET - Abnormal; Notable for the following components:      Result Value   WBC 14.3 (*)    Hemoglobin 9.2 (*)    HCT 31.1 (*)    MCV 75.3 (*)    MCH 22.3 (*)    MCHC 29.6 (*)    RDW 20.2 (*)    Neutro Abs 9.5 (*)    Abs Immature Granulocytes 0.09 (*)    All other components within normal limits  BASIC METABOLIC PANEL - Abnormal; Notable for the following components:   Potassium 3.2 (*)    Glucose, Bld 106 (*)    Calcium 8.4 (*)    All other components within normal limits  HCG, QUANTITATIVE, PREGNANCY    EKG ED ECG REPORT   Date: 05/04/2022 0542am  Rate: 113  Rhythm: sinus tachycardia  QRS Axis: normal  Intervals: normal  ST/T Wave abnormalities: normal  Conduction Disutrbances:none  I have personally reviewed the EKG tracing and agree with the computerized printout as noted.   Radiology No results found.  Procedures Procedures    Medications Ordered in ED Medications  EPINEPHrine (EPI-PEN) 0.3 mg/0.3 mL injection (  Given 05/04/22 0545)  diphenhydrAMINE (BENADRYL) injection 25 mg  (25 mg Intravenous Given 05/04/22 0551)  albuterol (PROVENTIL) (2.5 MG/3ML) 0.083% nebulizer solution 5 mg (5 mg Nebulization Given 05/04/22 0611)  acetaminophen (TYLENOL) tablet 650 mg (650 mg Oral Given 05/04/22 0652)  iohexol (OMNIPAQUE) 300 MG/ML solution 75 mL (75 mLs Intravenous Contrast Given 05/04/22 0701)    ED Course/ Medical Decision Making/ A&P Clinical Course as of 05/04/22 0714  Mon May 04, 2022  May 06, 2022 Patient presented for reportedly having an allergic reaction.  When I arrived to the room she was already receiving EpiPen per nursing.  However on further exploration, this may actually be an underlying infectious etiology causing the unilateral swelling and tenderness.  We will likely proceed with CT imaging to evaluate for any abscess.  Patient with significant anxiety.  However her uvula is midline without any edema, and she does not have any evidence of immediate airway compromise at this time [DW]  0602 WBC(!): 14.3 leukocytosis [DW]  0609 Hemoglobin(!): 9.2 Chronic anemia [DW]  0631 Patient reports her shortness of breath is improving.  She still has significant facial and lip swelling on the right side.  CT imaging is pending at this time [DW]  0711 Signed out to dr Rogene Houston at shift change to f/u on CT imaging [DW]    Clinical Course User Index [DW] Ripley Fraise, MD                           Medical Decision Making Amount and/or Complexity of Data Reviewed Labs: ordered. Decision-making details documented in ED Course. Radiology: ordered.  Risk OTC drugs. Prescription drug management.   This patient presents to the ED for concern of facial swelling, this involves an extensive number of treatment options, and is a complaint that carries with it a high risk of complications and morbidity.  The differential diagnosis includes but is not limited to anaphylaxis, ACE inhibitor induced angioedema, facial cellulitis, dental abscess  Comorbidities that complicate the  patient evaluation: Patient's presentation is complicated by their history of chronic pain and hypertension  Social Determinants of Health: Patient's obesity and chronic pain increases the complexity of managing their presentation  Additional history obtained: Additional history obtained from significant other  Lab Tests: I Ordered, and personally interpreted labs.  The pertinent results include:  leukocytosis   Cardiac Monitoring: The patient was maintained on a cardiac monitor.  I personally viewed and interpreted the cardiac monitor which showed an underlying rhythm of:  sinus tachycardia  Medicines ordered and prescription drug management: I ordered medication including EpiPen and Benadryl for possible allergic reaction Reevaluation of the patient after these medicines showed that the patient    improved   Reevaluation: After the interventions noted above, I reevaluated the patient and found that they have :improved  Complexity of problems addressed: Patient's presentation is most consistent with  acute presentation with potential threat to life or bodily function          Final Clinical Impression(s) / ED Diagnoses Final diagnoses:  Facial swelling    Rx / DC Orders ED Discharge Orders     None         Ripley Fraise, MD 05/04/22 [phone]

## 2022-05-04 NOTE — Discharge Instructions (Addendum)
Take extra strength Tylenol as needed for pain.  Continue your buprenorphine naloxone that you have at home. Would expect the cellulitis to improve with the doxycycline over the next few days.  Because of being on this medication cannot prescribe any narcotics.  CT scan showed no evidence of any deep space infection.  Seems to be skin cellulitis.  Make an appointment to follow-up with your doctor for later in the week.  Return for any new or worse symptoms.  Work note provided to be out of work this week.

## 2022-05-04 NOTE — ED Provider Notes (Signed)
Patient turned over to me.  Patient with a lesion at the crease of her mouth on the right side.  Clinically appears to be a cellulitis to the right side of the face.  CT scan was done to rule out deep space infection.  Does not show any abscesses does not show any dental or salivary gland problems.  Seems to be due to his skin cellulitis.  Nursing originally treated her as if it was an acute allergic reaction and patient got an EpiPen.  But this does not seem to be consistent with allergic reaction at all.  White blood cell count elevated a little bit of 14.3 potassium down slightly at 3.2 kidney function normal.  I have ordered Flagyl and Levaquin for her because she has cephalosporin allergy.  Patient also is on buprenorphine and naloxone for opiate problem.  The patient cannot have narcotics.  Patient states that she has ulcer bleeding problems so she cannot have nonsteroidals or Toradol.  We will treat with extra strength Tylenol we will discharge home with doxycycline.  Patient follow-up with her regular doctor.  Work note provided.  Would expect this to improve significantly over the next few days.  Will return for any new or worse symptoms.   Fredia Sorrow, MD 05/04/22 1037

## 2022-08-20 DIAGNOSIS — R030 Elevated blood-pressure reading, without diagnosis of hypertension: Secondary | ICD-10-CM | POA: Diagnosis not present

## 2022-09-24 ENCOUNTER — Encounter: Payer: Self-pay | Admitting: Radiology

## 2022-10-04 ENCOUNTER — Encounter (HOSPITAL_COMMUNITY): Payer: Self-pay

## 2022-10-04 ENCOUNTER — Emergency Department (HOSPITAL_COMMUNITY): Payer: 59

## 2022-10-04 ENCOUNTER — Emergency Department (HOSPITAL_COMMUNITY)
Admission: EM | Admit: 2022-10-04 | Discharge: 2022-10-04 | Disposition: A | Discharge status: Home / Self Care | Payer: 59 | Attending: Emergency Medicine | Admitting: Emergency Medicine

## 2022-10-04 ENCOUNTER — Other Ambulatory Visit: Payer: Self-pay

## 2022-10-04 DIAGNOSIS — W108XXA Fall (on) (from) other stairs and steps, initial encounter: Secondary | ICD-10-CM | POA: Insufficient documentation

## 2022-10-04 DIAGNOSIS — S99911A Unspecified injury of right ankle, initial encounter: Secondary | ICD-10-CM | POA: Diagnosis present

## 2022-10-04 DIAGNOSIS — S0993XA Unspecified injury of face, initial encounter: Secondary | ICD-10-CM | POA: Insufficient documentation

## 2022-10-04 DIAGNOSIS — Y9301 Activity, walking, marching and hiking: Secondary | ICD-10-CM | POA: Insufficient documentation

## 2022-10-04 DIAGNOSIS — I10 Essential (primary) hypertension: Secondary | ICD-10-CM | POA: Insufficient documentation

## 2022-10-04 DIAGNOSIS — W19XXXA Unspecified fall, initial encounter: Secondary | ICD-10-CM

## 2022-10-04 DIAGNOSIS — M25571 Pain in right ankle and joints of right foot: Secondary | ICD-10-CM

## 2022-10-04 NOTE — ED Triage Notes (Signed)
Pt injured ankle yesterday while stepping off of porch. Concerns for a broken ankle due to popping noise and severe pain. Pt has hx of surgery to same ankle.

## 2022-10-04 NOTE — ED Provider Notes (Signed)
[facility] Provider Note   CSN: [id number] Arrival date & time: 10/04/22  1354     History  Chief Complaint  Patient presents with   Mary Jarvis    Mary Jarvis is a 47 y.o. female.   Fall   47 year old female presents emergency department after a fall.  Patient states that she was walking out of her carport down steps when she rolled her right ankle and subsequently fell.  She reports hitting the front of her face on the ground and "possibly losing consciousness."  She states she has been able to walk on affected ankle but with pain.  Reports history of prior surgery when she was younger on right foot.  Denies weakness or sensory deficits in affected foot.  Currently denies visual disturbance, gait abnormality, slurred speech, facial droop.  Denies chest pain, shortness of breath, abdominal pain, nausea, vomiting, urinary symptoms, change in bowel habits.  Patient on no blood thinners.  Past medical history significant for chronic pain syndrome, generalized anxiety disorder, hyperlipidemia, hypertension, liver failure, peripheral neuropathy, tachycardia, seizure  Home Medications Prior to Admission medications   Medication Sig Start Date End Date Taking? Authorizing Provider  albuterol (PROVENTIL) (2.5 MG/3ML) 0.083% nebulizer solution Take 3 mLs (2.5 mg total) by nebulization every 6 (six) hours as needed for wheezing or shortness of breath. 02/28/19   Volanda Napoleon, PA-C  albuterol (VENTOLIN HFA) 108 (90 Base) MCG/ACT inhaler Inhale 1-2 puffs into the lungs every 6 (six) hours as needed for wheezing or shortness of breath. 02/28/19   Volanda Napoleon, PA-C  buprenorphine-naloxone (SUBOXONE) 8-2 mg SUBL SL tablet Place 1 tablet under the tongue 3 (three) times daily. 12/06/20   [provider]  doxycycline (VIBRAMYCIN) 100 MG capsule Take 1 capsule (100 mg total) by mouth 2 (two) times daily. 05/04/22   Fredia Sorrow, MD  DULoxetine  (CYMBALTA) 60 MG capsule Take 60 mg by mouth daily. 11/12/21   [provider]  ferrous sulfate 325 (65 FE) MG tablet Take 1 tablet (325 mg total) by mouth daily. 11/17/21   Isla Pence, MD  gabapentin (NEURONTIN) 100 MG capsule Take 2 caps every 8 hrs Patient not taking: Reported on 11/17/2021 05/01/21   Carole Civil, MD  gabapentin (NEURONTIN) 400 MG capsule Take 400 mg by mouth 4 (four) times daily. 11/07/21   [provider]  hydrochlorothiazide (HYDRODIURIL) 25 MG tablet Take 1 tablet (25 mg total) by mouth daily. 11/17/21   Isla Pence, MD  levETIRAcetam (KEPPRA XR) 500 MG 24 hr tablet Take 500 mg by mouth 2 (two) times daily. 01/31/21   [provider]  liraglutide (VICTOZA) 18 MG/3ML SOPN Inject into the skin as directed.    [provider]  methocarbamol (ROBAXIN) 500 MG tablet Take 1 tablet (500 mg total) by mouth every 8 (eight) hours as needed for muscle spasms. 11/17/21   Godfrey Pick, MD  pantoprazole (PROTONIX) 20 MG tablet Take 1 tablet (20 mg total) by mouth daily. Patient not taking: Reported on 11/17/2021 01/12/19   Julianne Rice, MD  trolamine salicylate (ASPERCREME) 10 % cream Apply 1 application topically as needed for muscle pain.    [provider]      Allergies    Aspirin, Ibuprofen, Tramadol, Ancef [cefazolin], and Bee venom    Review of Systems   Review of Systems  All other systems reviewed and are negative.   Physical Exam Updated Vital Signs BP (!) 142/86 (BP Location:  Right Arm)   Pulse 72   Temp 98.5 F (36.9 C) (Oral)   Resp 16   Ht 5' (1.524 m)   Wt 69.9 kg   SpO2 96%   BMI 30.08 kg/m  Physical Exam Vitals and nursing note reviewed.  Constitutional:      General: She is not in acute distress.    Appearance: She is well-developed.  HENT:     Head: Normocephalic.     Comments: Mild abrasion right lower lip. Eyes:     Conjunctiva/sclera: Conjunctivae normal.  Cardiovascular:     Rate and  Rhythm: Normal rate and regular rhythm.     Heart sounds: No murmur heard. Pulmonary:     Effort: Pulmonary effort is normal. No respiratory distress.     Breath sounds: Normal breath sounds.  Abdominal:     Palpations: Abdomen is soft.     Tenderness: There is no abdominal tenderness.  Musculoskeletal:        General: No swelling.     Cervical back: Neck supple.     Comments: No midline tenderness of cervical, thoracic, lumbar spine with no obvious step-off or deformity noted.  No anterior chest wall tenderness.  No tender palpation of upper or lower extremities besides right foot/ankle.  Patient mild swelling appreciated on right lateral ankle.  Patient with tender palpation over right ATFL region.  Radial pedal pulses 2+ bilaterally.  Muscular strength symmetric bilaterally with patient full active range of motion of right foot and ankle.  Skin:    General: Skin is warm and dry.     Capillary Refill: Capillary refill takes less than 2 seconds.  Neurological:     Mental Status: She is alert.     Comments: Alert and oriented to self, place, time and event.   Speech is fluent, clear without dysarthria or dysphasia.   Strength 5/5 in upper/lower extremities   Sensation intact in upper/lower extremities   Patient is gait not assessed at this time due to right ankle injury.  Pending medical x-ray. CN I not tested  CN II not tested CN III, IV, VI PERRLA and EOMs intact bilaterally  CN V Intact sensation to sharp and light touch to the face  CN VII facial movements symmetric  CN VIII not tested  CN IX, X no uvula deviation, symmetric rise of soft palate  CN XI 5/5 SCM and trapezius strength bilaterally  CN XII Midline tongue protrusion, symmetric L/R movements     Psychiatric:        Mood and Affect: Mood normal.     ED Results / Procedures / Treatments   Labs (all labs ordered are listed, but only abnormal results are displayed) Labs Reviewed - No data to  display  EKG None  Radiology CT Head Wo Contrast  Result Date: 10/04/2022 CLINICAL DATA:  Moderate to severe head trauma after a fall yesterday. EXAM: CT HEAD WITHOUT CONTRAST TECHNIQUE: Contiguous axial images were obtained from the base of the skull through the vertex without intravenous contrast. RADIATION DOSE REDUCTION: This exam was performed according to the departmental dose-optimization program which includes automated exposure control, adjustment of the mA and/or kV according to patient size and/or use of iterative reconstruction technique. COMPARISON:  12/09/2020 FINDINGS: Brain: There is no evidence for acute hemorrhage, hydrocephalus, mass lesion, or abnormal extra-axial fluid collection. No definite CT evidence for acute infarction. Vascular: No hyperdense vessel or unexpected calcification. Skull: No evidence for fracture. No worrisome lytic or sclerotic lesion. Sinuses/Orbits: The  visualized paranasal sinuses and mastoid air cells are clear. Visualized portions of the globes and intraorbital fat are unremarkable. Other: None. IMPRESSION: No acute intracranial abnormality. Electronically Signed   By: Misty Stanley M.D.   On: 10/04/2022 14:37   DG Ankle Complete Right  Result Date: 10/04/2022 CLINICAL DATA:  Injury to the ankle with popping noise and severe pain EXAM: RIGHT ANKLE - COMPLETE 3 VIEW COMPARISON:  Right ankle radiographs dated 07/08/2016 FINDINGS: There is no evidence of fracture, dislocation, or joint effusion. Old healed fracture of the distal fibula. Mild soft tissue swelling over the lateral malleolus. Partially imaged surgical hardware projecting over the distal metatarsals on lateral view. IMPRESSION: No acute fracture or dislocation. Mild soft tissue swelling over the lateral malleolus. Electronically Signed   By: Darrin Nipper M.D.   On: 10/04/2022 14:35    Procedures Procedures    Medications Ordered in ED Medications - No data to display  ED Course/ Medical  Decision Making/ A&P                             Medical Decision Making Amount and/or Complexity of Data Reviewed Radiology: ordered.   This patient presents to the ED for concern of fall, this involves an extensive number of treatment options, and is a complaint that carries with it a high risk of complications and morbidity.  The differential diagnosis includes CVA, fracture, stricture sprain, dislocation, ligamentous/tendinous injury, acute ischemic limb, compartment syndrome   Co morbidities that complicate the patient evaluation  See HPI   Additional history obtained:  Additional history obtained from EMR External records from outside source obtained and reviewed including hospital records   Lab Tests:  N/a   Imaging Studies ordered:  I ordered imaging studies including CT head/right ankle x-ray I independently visualized and interpreted imaging which showed   CT head: No acute intracranial abnormality Right ankle x-ray: No acute osseous abnormality I agree with the radiologist interpretation   Cardiac Monitoring: / EKG:  The patient was maintained on a cardiac monitor.  I personally viewed and interpreted the cardiac monitored which showed an underlying rhythm of: Sinus rhythm   Consultations Obtained:  N/a   Problem List / ED Course / Critical interventions / Medication management  Fall/right ankle pain Reevaluation of the patient showed that the patient stayed the same I have reviewed the patients home medicines and have made adjustments as needed   Social Determinants of Health:  Chronic cigarette use.  Denies illicit drug use.   Test / Admission - Considered:  Fall/right ankle pain Vitals signs significant for mild hypertension blood pressure of 142/86. Otherwise within normal range and stable throughout visit. Laboratory/imaging studies significant for: See above Patient with evidence of mechanical fall with trauma to right ankle and head.   Patient neurologically intact without acute deficit.  CT head without any acute intracranial abnormality.  Patient's right ankle with tenderness to palpation mainly over right ATFL region.  Patient's right foot neurovascular intact with no evidence of compartment syndrome.  X-ray negative for acute fracture or dislocation.  Symptoms most likely secondary to acute sprain.  Recommend treatment with ASO lace up brace and follow-up with orthopedics outpatient.  Symptomatic therapy recommended with rest, ice, elevation of the extremity as well as Tylenol/Motrin as needed for pain/palpation.  Recommend follow-up with orthopedics for reevaluation of symptoms.  Treatment plan discussed at length with patient and she acknowledged understanding was agreeable to said  plan. Worrisome signs and symptoms were discussed with the patient, and the patient acknowledged understanding to return to the ED if noticed. Patient was stable upon discharge.          Final Clinical Impression(s) / ED Diagnoses Final diagnoses:  Fall, initial encounter  Acute right ankle pain    Rx / DC Orders ED Discharge Orders     None         Wilnette Kales, [place] 10/04/22 1510    Noemi Chapel, MD 10/04/22 1733

## 2022-10-04 NOTE — Discharge Instructions (Addendum)
Note the workup today was reassuring.  As discussed, recommend rest, ice, elevation of affected ankle as well as treatment of pain with tylenol. Recommend follow-up with orthopedics regarding right ankle pain.  CT imaging of the head was without abnormality.  X-ray of your ankle was without acute fracture or dislocation.  Wear ankle brace as needed fracture support.  Please do not hesitate to return to emergency department if the worrisome signs and symptoms we discussed become apparent.

## 2023-06-09 DIAGNOSIS — E611 Iron deficiency: Secondary | ICD-10-CM | POA: Diagnosis not present

## 2023-06-09 DIAGNOSIS — R030 Elevated blood-pressure reading, without diagnosis of hypertension: Secondary | ICD-10-CM | POA: Diagnosis not present

## 2023-06-09 DIAGNOSIS — Z131 Encounter for screening for diabetes mellitus: Secondary | ICD-10-CM | POA: Diagnosis not present

## 2023-06-10 ENCOUNTER — Encounter: Payer: Self-pay | Admitting: *Deleted

## 2023-06-21 ENCOUNTER — Encounter: Payer: Self-pay | Admitting: *Deleted

## 2023-07-06 ENCOUNTER — Emergency Department (HOSPITAL_COMMUNITY)
Admission: EM | Admit: 2023-07-06 | Discharge: 2023-07-06 | Disposition: A | Discharge status: Home / Self Care | Payer: 59 | Attending: Emergency Medicine | Admitting: Emergency Medicine

## 2023-07-06 ENCOUNTER — Encounter (HOSPITAL_COMMUNITY): Payer: Self-pay | Admitting: *Deleted

## 2023-07-06 ENCOUNTER — Emergency Department (HOSPITAL_COMMUNITY): Payer: 59

## 2023-07-06 ENCOUNTER — Other Ambulatory Visit: Payer: Self-pay

## 2023-07-06 DIAGNOSIS — R062 Wheezing: Secondary | ICD-10-CM | POA: Diagnosis not present

## 2023-07-06 DIAGNOSIS — I10 Essential (primary) hypertension: Secondary | ICD-10-CM | POA: Diagnosis not present

## 2023-07-06 DIAGNOSIS — R600 Localized edema: Secondary | ICD-10-CM | POA: Insufficient documentation

## 2023-07-06 DIAGNOSIS — Z79899 Other long term (current) drug therapy: Secondary | ICD-10-CM | POA: Diagnosis not present

## 2023-07-06 DIAGNOSIS — R040 Epistaxis: Secondary | ICD-10-CM | POA: Insufficient documentation

## 2023-07-06 DIAGNOSIS — R21 Rash and other nonspecific skin eruption: Secondary | ICD-10-CM | POA: Insufficient documentation

## 2023-07-06 LAB — CBC
HCT: 29.9 % — ABNORMAL LOW (ref 36.0–46.0)
Hemoglobin: 8.3 g/dL — ABNORMAL LOW (ref 12.0–15.0)
MCH: 18.9 pg — ABNORMAL LOW (ref 26.0–34.0)
MCHC: 27.8 g/dL — ABNORMAL LOW (ref 30.0–36.0)
MCV: 68.1 fL — ABNORMAL LOW (ref 80.0–100.0)
Platelets: 284 10*3/uL (ref 150–400)
RBC: 4.39 MIL/uL (ref 3.87–5.11)
RDW: 19.4 % — ABNORMAL HIGH (ref 11.5–15.5)
WBC: 5.6 10*3/uL (ref 4.0–10.5)
nRBC: 0 % (ref 0.0–0.2)

## 2023-07-06 LAB — COMPREHENSIVE METABOLIC PANEL
ALT: 16 U/L (ref 0–44)
AST: 23 U/L (ref 15–41)
Albumin: 4.1 g/dL (ref 3.5–5.0)
Alkaline Phosphatase: 60 U/L (ref 38–126)
Anion gap: 10 (ref 5–15)
BUN: 8 mg/dL (ref 6–20)
CO2: 25 mmol/L (ref 22–32)
Calcium: 9 mg/dL (ref 8.9–10.3)
Chloride: 101 mmol/L (ref 98–111)
Creatinine, Ser: 0.52 mg/dL (ref 0.44–1.00)
GFR, Estimated: >60 mL/min (ref >60)
Glucose, Bld: 101 mg/dL — ABNORMAL HIGH (ref 70–99)
Potassium: 3.8 mmol/L (ref 3.5–5.1)
Sodium: 136 mmol/L (ref 135–145)
Total Bilirubin: 0.9 mg/dL (ref <1.2)
Total Protein: 7.7 g/dL (ref 6.5–8.1)

## 2023-07-06 LAB — BRAIN NATRIURETIC PEPTIDE: B Natriuretic Peptide: 45 pg/mL (ref 0.0–100.0)

## 2023-07-06 MED ORDER — METHYLPREDNISOLONE SODIUM SUCC 125 MG IJ SOLR
125.0000 mg | Freq: Once | INTRAMUSCULAR | Status: AC
  Administered 2023-07-06: 125 mg via INTRAVENOUS
  Filled 2023-07-06: qty 2

## 2023-07-06 MED ORDER — ONDANSETRON HCL 4 MG/2ML IJ SOLN
4.0000 mg | Freq: Once | INTRAMUSCULAR | Status: AC
  Administered 2023-07-06: 4 mg via INTRAVENOUS
  Filled 2023-07-06: qty 2

## 2023-07-06 MED ORDER — OXYMETAZOLINE HCL 0.05 % NA SOLN: 1.0000 | Freq: Two times a day (BID) | NASAL | 0 refills | Status: DC

## 2023-07-06 MED ORDER — FAMOTIDINE IN NACL 20-0.9 MG/50ML-% IV SOLN
20.0000 mg | Freq: Once | INTRAVENOUS | Status: AC
Start: 2023-07-06 — End: 2023-07-06
  Administered 2023-07-06: 20 mg via INTRAVENOUS
  Filled 2023-07-06: qty 50

## 2023-07-06 MED ORDER — PREDNISONE 20 MG PO TABS: 40.0000 mg | ORAL_TABLET | Freq: Every day | ORAL | 0 refills | Status: DC

## 2023-07-06 MED ORDER — DIPHENHYDRAMINE HCL 50 MG/ML IJ SOLN
25.0000 mg | Freq: Once | INTRAMUSCULAR | Status: AC
  Administered 2023-07-06: 25 mg via INTRAVENOUS
  Filled 2023-07-06: qty 1

## 2023-07-06 NOTE — ED Triage Notes (Signed)
Pt states she was at work when her nose started bleeding and states he has been vomiting blood x 2 hours  Pt states has hives all over legs and states her chest was very red  Pt states the only change is her BP med has been changed by her doctor

## 2023-07-06 NOTE — ED Notes (Signed)
PA at bedside.

## 2023-07-06 NOTE — Discharge Instructions (Addendum)
Regarding nosebleed, your episodes were slightly low compared to last time they were drawn.  I have a low suspicion this is from your nosebleed.  Recommend repeat blood work by your primary care.  Will send home with Afrin to use as needed if nosebleed returns.  Will send you home with prednisone to take for the next several days, concerning for allergic reaction.  You may continue to take allergy medicine such as Zyrtec/Claritin/Allegra for treatment of your symptoms as well.  Please not hesitate to return if the worrisome signs and symptoms we discussed become apparent.

## 2023-07-06 NOTE — ED Provider Notes (Signed)
[place] EMERGENCY DEPARTMENT AT [facility] Provider Note   CSN: [id number] Arrival date & time: 07/06/23  1050     History  Chief Complaint  Patient presents with   Epistaxis    Mary Jarvis is a 47 y.o. female.   Epistaxis   47 year old female presents emergency department with a few different complaints.  Reports waking up this morning and noting swelling to both of her lower extremities.  States she has had this in the past and has follow-up with cardiology.  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  Denies any shortness of breath, chest pain.  States that 2 hours prior to arrival, developed a right-sided nosebleed.  States that she also developed hives on her chest as well as her legs described as burning.  States she has a history of allergic reaction states this feels somewhat similar.  Denies difficulty breathing/throat closing on her.  Denies any known allergen exposure or changes in daily hygiene products.  Past medical history significant for chronic pain syndrome, hypertension, hyperlipidemia, peripheral neuropathy, seizure  Home Medications Prior to Admission medications   Medication Sig Start Date End Date Taking? Authorizing Provider  oxymetazoline (AFRIN NASAL SPRAY) 0.05 % nasal spray Place 1 spray into both nostrils 2 (two) times daily. 07/06/23  Yes Peter Garter, PA  predniSONE (DELTASONE) 20 MG tablet Take 2 tablets (40 mg total) by mouth daily with breakfast. 07/07/23  Yes Sherian Maroon A, PA  albuterol (PROVENTIL) (2.5 MG/3ML) 0.083% nebulizer solution Take 3 mLs (2.5 mg total) by nebulization every 6 (six) hours as needed for wheezing or shortness of breath. 02/28/19   Maxwell Caul, PA-C  albuterol (VENTOLIN HFA) 108 (90 Base) MCG/ACT inhaler Inhale 1-2 puffs into the lungs every 6 (six) hours as needed for wheezing or shortness of breath. 02/28/19   Maxwell Caul, PA-C  buprenorphine-naloxone (SUBOXONE) 8-2 mg  SUBL SL tablet Place 1 tablet under the tongue 3 (three) times daily. 12/06/20   [provider]  doxycycline (VIBRAMYCIN) 100 MG capsule Take 1 capsule (100 mg total) by mouth 2 (two) times daily. 05/04/22   Vanetta Mulders, MD  DULoxetine (CYMBALTA) 60 MG capsule Take 60 mg by mouth daily. 11/12/21   [provider]  ferrous sulfate 325 (65 FE) MG tablet Take 1 tablet (325 mg total) by mouth daily. 11/17/21   Jacalyn Lefevre, MD  gabapentin (NEURONTIN) 100 MG capsule Take 2 caps every 8 hrs Patient not taking: Reported on 11/17/2021 05/01/21   Vickki Hearing, MD  gabapentin (NEURONTIN) 400 MG capsule Take 400 mg by mouth 4 (four) times daily. 11/07/21   [provider]  hydrochlorothiazide (HYDRODIURIL) 25 MG tablet Take 1 tablet (25 mg total) by mouth daily. 11/17/21   Jacalyn Lefevre, MD  levETIRAcetam (KEPPRA XR) 500 MG 24 hr tablet Take 500 mg by mouth 2 (two) times daily. 01/31/21   [provider]  liraglutide (VICTOZA) 18 MG/3ML SOPN Inject into the skin as directed.    [provider]  methocarbamol (ROBAXIN) 500 MG tablet Take 1 tablet (500 mg total) by mouth every 8 (eight) hours as needed for muscle spasms. 11/17/21   Gloris Manchester, MD  pantoprazole (PROTONIX) 20 MG tablet Take 1 tablet (20 mg total) by mouth daily. Patient not taking: Reported on 11/17/2021 01/12/19   Loren Racer, MD  trolamine salicylate (ASPERCREME) 10 % cream Apply 1 application topically as needed for muscle pain.    [provider]  Allergies    Aspirin, Ibuprofen, Tramadol, Ancef [cefazolin], and Bee venom    Review of Systems   Review of Systems  HENT:  Positive for nosebleeds.   All other systems reviewed and are negative.   Physical Exam Updated Vital Signs BP 118/69 (BP Location: Left Arm)   Pulse 70   Temp 98.8 F (37.1 C)   Resp 20   Ht 5' (1.524 m)   Wt 82.6 kg   LMP 06/29/2023 (Approximate)   SpO2 97%   BMI 35.54 kg/m  Physical  Exam Vitals and nursing note reviewed.  Constitutional:      General: She is not in acute distress.    Appearance: She is well-developed.  HENT:     Head: Normocephalic and atraumatic.     Nose:     Comments: Dried blood appreciated right anterior nare.  No active bleeding. Eyes:     Conjunctiva/sclera: Conjunctivae normal.  Cardiovascular:     Rate and Rhythm: Normal rate and regular rhythm.     Heart sounds: No murmur heard. Pulmonary:     Effort: Pulmonary effort is normal. No respiratory distress.     Breath sounds: No rhonchi or rales.     Comments: Faint wheeze auscultated bilaterally. Abdominal:     Palpations: Abdomen is soft.     Tenderness: There is no abdominal tenderness. There is no guarding.  Musculoskeletal:        General: No swelling.     Cervical back: Neck supple.     Right lower leg: Edema present.     Left lower leg: Edema present.     Comments: 1-2+ bilateral lower extremity pitting edema.  Skin:    General: Skin is warm and dry.     Capillary Refill: Capillary refill takes less than 2 seconds.     Comments: Patient with raised wheal-like rash appreciated top of chest as well as around mid tibia/fibula on the left side.  Neurological:     Mental Status: She is alert.  Psychiatric:        Mood and Affect: Mood normal.     ED Results / Procedures / Treatments   Labs (all labs ordered are listed, but only abnormal results are displayed) Labs Reviewed  COMPREHENSIVE METABOLIC PANEL - Abnormal; Notable for the following components:      Result Value   Glucose, Bld 101 (*)    All other components within normal limits  CBC - Abnormal; Notable for the following components:   Hemoglobin 8.3 (*)    HCT 29.9 (*)    MCV 68.1 (*)    MCH 18.9 (*)    MCHC 27.8 (*)    RDW 19.4 (*)    All other components within normal limits  BRAIN NATRIURETIC PEPTIDE    EKG None  Radiology US Venous Img Lower Bilateral  Result Date: 07/06/2023 CLINICAL DATA:   swelling EXAM: BILATERAL LOWER EXTREMITY VENOUS DOPPLER ULTRASOUND TECHNIQUE: Gray-scale sonography with graded compression, as well as color Doppler and duplex ultrasound were performed to evaluate the lower extremity deep venous systems from the level of the common femoral vein and including the common femoral, femoral, profunda femoral, popliteal and calf veins including the posterior tibial, peroneal and gastrocnemius veins when visible. The superficial great saphenous vein was also interrogated. Spectral Doppler was utilized to evaluate flow at rest and with distal augmentation maneuvers in the common femoral, femoral and popliteal veins. COMPARISON:  RIGHT ankle XRs, 10/04/2022.  CT AP, 11/17/2021 FINDINGS: RIGHT LOWER  EXTREMITY VENOUS Normal compressibility of the RIGHT common femoral, superficial femoral, and popliteal veins, as well as the visualized calf veins. Visualized portions of profunda femoral vein and great saphenous vein unremarkable. No filling defects to suggest DVT on grayscale or color Doppler imaging. Doppler waveforms show normal direction of venous flow, normal respiratory plasticity and response to augmentation. OTHER No evidence of superficial thrombophlebitis or abnormal fluid collection. Limitations: none LEFT LOWER EXTREMITY VENOUS Normal compressibility of the LEFT common femoral, superficial femoral, and popliteal veins, as well as the visualized calf veins. Visualized portions of profunda femoral vein and great saphenous vein unremarkable. No filling defects to suggest DVT on grayscale or color Doppler imaging. Doppler waveforms show normal direction of venous flow, normal respiratory plasticity and response to augmentation. OTHER No evidence of superficial thrombophlebitis or abnormal fluid collection. Limitations: none IMPRESSION: No evidence of femoropopliteal DVT or superficial thrombophlebitis within either lower extremity. Roanna Banning, MD Vascular and Interventional Radiology  [facility] Radiology Electronically Signed   By: Roanna Banning M.D.   On: 07/06/2023 13:20    Procedures Procedures    Medications Ordered in ED Medications  famotidine (PEPCID) IVPB 20 mg premix (0 mg Intravenous Stopped 07/06/23 1254)  methylPREDNISolone sodium succinate (SOLU-MEDROL) 125 mg/2 mL injection 125 mg (125 mg Intravenous Given 07/06/23 1203)  diphenhydrAMINE (BENADRYL) injection 25 mg (25 mg Intravenous Given 07/06/23 1202)  ondansetron (ZOFRAN) injection 4 mg (4 mg Intravenous Given 07/06/23 1202)    ED Course/ Medical Decision Making/ A&P                                 Medical Decision Making Amount and/or Complexity of Data Reviewed Labs: ordered.  Risk OTC drugs. Prescription drug management.   This patient presents to the ED for concern of nosebleed, allergic reaction, lower extremity swelling, this involves an extensive number of treatment options, and is a complaint that carries with it a high risk of complications and morbidity.  The differential diagnosis includes DVT, PE, dependent edema, CHF, allergic reaction, angioedema, anaphylaxis, epistaxis, coagulopathy, other   Co morbidities that complicate the patient evaluation  See HPI   Additional history obtained:  Additional history obtained from EMR External records from outside source obtained and reviewed including hospital records   Lab Tests:  I Ordered, and personally interpreted labs.  The pertinent results include: No Electra abnormalities.  No transaminitis.  No renal dysfunction.  No leukocytosis.  Evidence of anemia with a hemoglobin 8.3 which is microcytic in nature.  Platelets within range.  BNP normal   Imaging Studies ordered:  I ordered imaging studies including extremity ultrasound I independently visualized and interpreted imaging which showed negative for DVT I agree with the radiologist interpretation   Cardiac Monitoring: / EKG:  The patient was maintained  on a cardiac monitor.  I personally viewed and interpreted the cardiac monitored which showed an underlying rhythm of: Sinus   Consultations Obtained:  N/a   Problem List / ED Course / Critical interventions / Medication management  Epistaxis, rash, lower extremity swelling I ordered medication including Pepcid, Solu-Medrol, Benadryl, Zofran   Reevaluation of the patient after these medicines showed that the patient improved I have reviewed the patients home medicines and have made adjustments as needed   Social Determinants of Health:  Chronic cigarette use.  Denies illicit drug use.   Test / Admission - Considered:  Epistaxis, rash, lower extremity swelling Vitals signs  within normal range and stable throughout visit. Laboratory/imaging studies significant for: See above 47 year old female presents emergency department with chief significant plaints.  Has been with lower extremity swelling since this morning.  On exam, patient with pitting bilateral extremity swelling without obvious rales on exam.  Ultrasound lower extremities negative for DVT.  BNP normal.  Total protein, albumin normal.  Suspect the patient's symptoms are likely secondary to be dependent edema.  Will recommend compression and elevation of lower extremities.  Regarding nosebleed, was reported to have small amount of right nosebleed.  No bleeding throughout patient's 3+ hour stay while in the ED.  Labs concerning for anemia with a hemoglobin of 8.3; given patient's history of minimal bleeding, suspect that this is due to patient's heavy menstruation as she has history of chronic anemia.  Will recommend follow-up with PCP in the outpatient setting for reevaluation of hemoglobin as well as getting back on her iron supplementation in the outpatient setting.  Regarding rash, concern for urticarial rash.  Unknown exact etiology.  Treated with an histamine as well as corticosteroid noted resolution while in the ED with  subsequent observation for 3+ hours.  Would recommend detailed history at home regarding potential change in allergen exposure.  Will place patient on short course of corticosteroids and recommend continued use of antihistamines as needed.  Close follow-up with primary care recommended for reevaluation.  Treatment plan discussed at length with patient and she acknowledged understand was agreeable to said plan.  Patient overall well-appearing, afebrile in no acute distress. Worrisome signs and symptoms were discussed with the patient, and the patient acknowledged understanding to return to the ED if noticed. Patient was stable upon discharge.          Final Clinical Impression(s) / ED Diagnoses Final diagnoses:  Epistaxis  Rash    Rx / DC Orders ED Discharge Orders          Ordered    predniSONE (DELTASONE) 20 MG tablet  Daily with breakfast        07/06/23 1334    oxymetazoline (AFRIN NASAL SPRAY) 0.05 % nasal spray  2 times daily        07/06/23 1334              Peter Garter, [place] 07/06/23 1453    Loetta Rough, MD 07/06/23 1500

## 2023-07-06 NOTE — ED Notes (Signed)
US completed

## 2023-11-30 DIAGNOSIS — M5416 Radiculopathy, lumbar region: Secondary | ICD-10-CM | POA: Diagnosis not present

## 2023-11-30 DIAGNOSIS — M5412 Radiculopathy, cervical region: Secondary | ICD-10-CM | POA: Diagnosis not present

## 2023-12-16 DIAGNOSIS — M5412 Radiculopathy, cervical region: Secondary | ICD-10-CM | POA: Diagnosis not present

## 2023-12-16 DIAGNOSIS — M5416 Radiculopathy, lumbar region: Secondary | ICD-10-CM | POA: Diagnosis not present

## 2023-12-23 DIAGNOSIS — M5412 Radiculopathy, cervical region: Secondary | ICD-10-CM | POA: Diagnosis not present

## 2023-12-23 DIAGNOSIS — M5416 Radiculopathy, lumbar region: Secondary | ICD-10-CM | POA: Diagnosis not present

## 2023-12-23 DIAGNOSIS — Z6832 Body mass index (BMI) 32.0-32.9, adult: Secondary | ICD-10-CM | POA: Diagnosis not present

## 2024-01-14 ENCOUNTER — Emergency Department (HOSPITAL_COMMUNITY)

## 2024-01-14 ENCOUNTER — Emergency Department (HOSPITAL_COMMUNITY)
Admission: EM | Admit: 2024-01-14 | Discharge: 2024-01-14 | Disposition: A | Discharge status: Home / Self Care | Source: Home / Self Care | Attending: Student | Admitting: Student

## 2024-01-14 ENCOUNTER — Encounter (HOSPITAL_COMMUNITY): Payer: Self-pay

## 2024-01-14 ENCOUNTER — Other Ambulatory Visit: Payer: Self-pay

## 2024-01-14 DIAGNOSIS — F17210 Nicotine dependence, cigarettes, uncomplicated: Secondary | ICD-10-CM | POA: Diagnosis not present

## 2024-01-14 DIAGNOSIS — I10 Essential (primary) hypertension: Secondary | ICD-10-CM | POA: Diagnosis not present

## 2024-01-14 DIAGNOSIS — G629 Polyneuropathy, unspecified: Secondary | ICD-10-CM | POA: Diagnosis not present

## 2024-01-14 DIAGNOSIS — R229 Localized swelling, mass and lump, unspecified: Secondary | ICD-10-CM | POA: Diagnosis present

## 2024-01-14 DIAGNOSIS — Z79899 Other long term (current) drug therapy: Secondary | ICD-10-CM | POA: Insufficient documentation

## 2024-01-14 DIAGNOSIS — K219 Gastro-esophageal reflux disease without esophagitis: Secondary | ICD-10-CM | POA: Diagnosis not present

## 2024-01-14 DIAGNOSIS — L03213 Periorbital cellulitis: Secondary | ICD-10-CM | POA: Diagnosis not present

## 2024-01-14 DIAGNOSIS — G8929 Other chronic pain: Secondary | ICD-10-CM | POA: Diagnosis not present

## 2024-01-14 DIAGNOSIS — H05011 Cellulitis of right orbit: Secondary | ICD-10-CM | POA: Diagnosis not present

## 2024-01-14 DIAGNOSIS — E785 Hyperlipidemia, unspecified: Secondary | ICD-10-CM | POA: Diagnosis not present

## 2024-01-14 LAB — CBC WITH DIFFERENTIAL/PLATELET
Abs Immature Granulocytes: 0.03 10*3/uL (ref 0.00–0.07)
Basophils Absolute: 0.1 10*3/uL (ref 0.0–0.1)
Basophils Relative: 1 %
Eosinophils Absolute: 0.1 10*3/uL (ref 0.0–0.5)
Eosinophils Relative: 1 %
HCT: 33.2 % — ABNORMAL LOW (ref 36.0–46.0)
Hemoglobin: 9.4 g/dL — ABNORMAL LOW (ref 12.0–15.0)
Immature Granulocytes: 0 %
Lymphocytes Relative: 22 %
Lymphs Abs: 1.5 10*3/uL (ref 0.7–4.0)
MCH: 19.8 pg — ABNORMAL LOW (ref 26.0–34.0)
MCHC: 28.3 g/dL — ABNORMAL LOW (ref 30.0–36.0)
MCV: 70 fL — ABNORMAL LOW (ref 80.0–100.0)
Monocytes Absolute: 0.4 10*3/uL (ref 0.1–1.0)
Monocytes Relative: 6 %
Neutro Abs: 4.9 10*3/uL (ref 1.7–7.7)
Neutrophils Relative %: 70 %
Platelets: 302 10*3/uL (ref 150–400)
RBC: 4.74 MIL/uL (ref 3.87–5.11)
RDW: 21.6 % — ABNORMAL HIGH (ref 11.5–15.5)
WBC: 7 10*3/uL (ref 4.0–10.5)
nRBC: 0 % (ref 0.0–0.2)

## 2024-01-14 LAB — BASIC METABOLIC PANEL WITH GFR
Anion gap: 10 (ref 5–15)
BUN: 7 mg/dL (ref 6–20)
CO2: 27 mmol/L (ref 22–32)
Calcium: 9 mg/dL (ref 8.9–10.3)
Chloride: 101 mmol/L (ref 98–111)
Creatinine, Ser: 0.72 mg/dL (ref 0.44–1.00)
GFR, Estimated: >60 mL/min (ref >60)
Glucose, Bld: 88 mg/dL (ref 70–99)
Potassium: 4.4 mmol/L (ref 3.5–5.1)
Sodium: 138 mmol/L (ref 135–145)

## 2024-01-14 MED ORDER — IOHEXOL 350 MG/ML SOLN
60.0000 mL | Freq: Once | INTRAVENOUS | Status: AC | PRN
  Administered 2024-01-14: 60 mL via INTRAVENOUS

## 2024-01-14 MED ORDER — SULFAMETHOXAZOLE-TRIMETHOPRIM 800-160 MG PO TABS: 1.0000 | ORAL_TABLET | Freq: Two times a day (BID) | ORAL | 0 refills | Status: AC

## 2024-01-14 MED ORDER — SULFAMETHOXAZOLE-TRIMETHOPRIM 800-160 MG PO TABS
1.0000 | ORAL_TABLET | Freq: Once | ORAL | Status: AC
  Administered 2024-01-14: 1 via ORAL
  Filled 2024-01-14: qty 1

## 2024-01-14 MED ORDER — KETOROLAC TROMETHAMINE 15 MG/ML IJ SOLN
15.0000 mg | Freq: Once | INTRAMUSCULAR | Status: AC
  Administered 2024-01-14: 15 mg via INTRAVENOUS
  Filled 2024-01-14: qty 1

## 2024-01-14 MED ORDER — AMOXICILLIN-POT CLAVULANATE 875-125 MG PO TABS
1.0000 | ORAL_TABLET | Freq: Once | ORAL | Status: AC
  Administered 2024-01-14: 1 via ORAL
  Filled 2024-01-14: qty 1

## 2024-01-14 MED ORDER — AMOXICILLIN-POT CLAVULANATE 875-125 MG PO TABS: 1.0000 | ORAL_TABLET | Freq: Two times a day (BID) | ORAL | 0 refills | Status: DC

## 2024-01-14 MED ORDER — ONDANSETRON HCL 4 MG/2ML IJ SOLN
4.0000 mg | Freq: Once | INTRAMUSCULAR | Status: AC
  Administered 2024-01-14: 4 mg via INTRAVENOUS
  Filled 2024-01-14: qty 2

## 2024-01-14 NOTE — Discharge Instructions (Addendum)
 It was a pleasure taking care of you today.  You were evaluated for swelling to the right eye.  You have a skin infection.  Your CT scan did not show any deeper infection.  Take the full course of antibiotics.  Come back to the ER if you have new or worsening symptoms.  Follow-up closely with your primary care doctor.  You can continue doing cool compresses .

## 2024-01-14 NOTE — ED Triage Notes (Signed)
 Pt c/o right facial swelling beginning around the eye with pressure extending to right sided headache. Noticeable swelling in triage. Pt states she mowed the yard 4 days ago and then 3 days ago noticed the swelling. Hx of HTN, no blood thinners.

## 2024-01-14 NOTE — ED Notes (Signed)
 Visual acuity was completed on both eyes    20/20 L eye   20/20 R eye

## 2024-01-14 NOTE — ED Notes (Addendum)
 Visual acuity was done. Pt was 20/20 on both Left and Right eye

## 2024-01-14 NOTE — ED Provider Notes (Addendum)
 [place] EMERGENCY DEPARTMENT AT [facility] Provider Note   CSN: [id number] Arrival date & time: 01/14/24  9087     Patient presents with: Facial Swelling   Mary Jarvis is a 48 y.o. female.  She has PMH of hypertension, gastric ulcer, hepatitis C, seizures, kidney stones. Patient presents the ER today complaining of swelling around her right eye for the past 3 days.  States she had a small scrape on her nose before this started.  She was outside mowing her lawn but does not notice that she was bitten by anything and did not get anything in her eye.  She has had some drainage and crusting from her right eye and notes blurry vision in the right eye.  She complains of pressure behind the right eye and feels like her eyes going to pop out.  This morning her eye was entirely swollen shut and it took several hours of using a cool compress to reduce the swelling enough to be able to open her eye normally.  She states she has some redness and warmth of the skin around her eye but denies any fevers or chills.  She was having nausea 2 days ago and did vomit once.  She took Dramamine with relief of the nausea.   HPI     Prior to Admission medications   Medication Sig Start Date End Date Taking? Authorizing Provider  amoxicillin -clavulanate (AUGMENTIN ) 875-125 MG tablet Take 1 tablet by mouth every 12 (twelve) hours. 01/14/24  Yes Westyn Keatley A, PA-C  sulfamethoxazole -trimethoprim  (BACTRIM  DS) 800-160 MG tablet Take 1 tablet by mouth 2 (two) times daily for 7 days. 01/14/24 01/21/24 Yes Johncarlo Maalouf A, PA-C  albuterol  (PROVENTIL ) (2.5 MG/3ML) 0.083% nebulizer solution Take 3 mLs (2.5 mg total) by nebulization every 6 (six) hours as needed for wheezing or shortness of breath. 02/28/19   Delorise Morna LABOR, PA-C  albuterol  (VENTOLIN  HFA) 108 (90 Base) MCG/ACT inhaler Inhale 1-2 puffs into the lungs every 6 (six) hours as needed for wheezing or shortness of breath. 02/28/19   Layden,  Lindsey A, PA-C  buprenorphine -naloxone  (SUBOXONE ) 8-2 mg SUBL SL tablet Place 1 tablet under the tongue 3 (three) times daily. 12/06/20   [provider]  doxycycline  (VIBRAMYCIN ) 100 MG capsule Take 1 capsule (100 mg total) by mouth 2 (two) times daily. 05/04/22   Zackowski, Scott, MD  DULoxetine (CYMBALTA) 60 MG capsule Take 60 mg by mouth daily. 11/12/21   [provider]  ferrous sulfate  325 (65 FE) MG tablet Take 1 tablet (325 mg total) by mouth daily. 11/17/21   Dean Clarity, MD  gabapentin  (NEURONTIN ) 100 MG capsule Take 2 caps every 8 hrs Patient not taking: Reported on 11/17/2021 05/01/21   Margrette Taft BRAVO, MD  gabapentin  (NEURONTIN ) 400 MG capsule Take 400 mg by mouth 4 (four) times daily. 11/07/21   [provider]  hydrochlorothiazide  (HYDRODIURIL ) 25 MG tablet Take 1 tablet (25 mg total) by mouth daily. 11/17/21   Dean Clarity, MD  levETIRAcetam  (KEPPRA  XR) 500 MG 24 hr tablet Take 500 mg by mouth 2 (two) times daily. 01/31/21   [provider]  liraglutide (VICTOZA) 18 MG/3ML SOPN Inject into the skin as directed.    [provider]  methocarbamol  (ROBAXIN ) 500 MG tablet Take 1 tablet (500 mg total) by mouth every 8 (eight) hours as needed for muscle spasms. 11/17/21   Melvenia Motto, MD  oxymetazoline  (AFRIN NASAL SPRAY) 0.05 % nasal spray Place 1 spray into both  nostrils 2 (two) times daily. 07/06/23   Silver Wonda LABOR, PA  pantoprazole  (PROTONIX ) 20 MG tablet Take 1 tablet (20 mg total) by mouth daily. Patient not taking: Reported on 11/17/2021 01/12/19   Carlyle Lenis, MD  predniSONE  (DELTASONE ) 20 MG tablet Take 2 tablets (40 mg total) by mouth daily with breakfast. 07/07/23   Silver Wonda LABOR, PA  trolamine salicylate (ASPERCREME) 10 % cream Apply 1 application topically as needed for muscle pain.    [provider]    Allergies: Aspirin, Ibuprofen , Tramadol , Ancef [cefazolin], and Bee venom    Review of Systems  Updated  Vital Signs BP 130/77   Pulse 90   Temp 97.9 F (36.6 C) (Oral)   Resp 20   Ht 5' (1.524 m)   Wt 71.2 kg   LMP 09/25/2023 (Exact Date)   SpO2 95%   BMI 30.66 kg/m   Physical Exam Vitals and nursing note reviewed.  Constitutional:      General: She is not in acute distress.    Appearance: She is well-developed.  HENT:     Head: Normocephalic and atraumatic.     Mouth/Throat:     Mouth: Mucous membranes are moist.   Eyes:     Extraocular Movements: Extraocular movements intact.     Conjunctiva/sclera: Conjunctivae normal.     Pupils: Pupils are equal, round, and reactive to light.    Cardiovascular:     Rate and Rhythm: Normal rate and regular rhythm.     Heart sounds: No murmur heard. Pulmonary:     Effort: Pulmonary effort is normal. No respiratory distress.     Breath sounds: Normal breath sounds.  Abdominal:     Palpations: Abdomen is soft.     Tenderness: There is no abdominal tenderness.   Musculoskeletal:        General: No swelling.     Cervical back: Neck supple.   Skin:    General: Skin is warm and dry.     Capillary Refill: Capillary refill takes less than 2 seconds.   Neurological:     General: No focal deficit present.     Mental Status: She is alert and oriented to person, place, and time.   Psychiatric:        Mood and Affect: Mood normal.    (all labs ordered are listed, but only abnormal results are displayed) Labs Reviewed  CBC WITH DIFFERENTIAL/PLATELET - Abnormal; Notable for the following components:      Result Value   Hemoglobin 9.4 (*)    HCT 33.2 (*)    MCV 70.0 (*)    MCH 19.8 (*)    MCHC 28.3 (*)    RDW 21.6 (*)    All other components within normal limits  BASIC METABOLIC PANEL WITH GFR    EKG: None  Radiology: CT Orbits W Contrast Result Date: 01/14/2024 CLINICAL DATA:  Right eye pain and redness and periorbital cellulitis. EXAM: CT ORBITS WITH CONTRAST TECHNIQUE: Multidetector CT images was performed according to  the standard protocol following intravenous contrast administration. RADIATION DOSE REDUCTION: This exam was performed according to the departmental dose-optimization program which includes automated exposure control, adjustment of the mA and/or kV according to patient size and/or use of iterative reconstruction technique. CONTRAST:  60mL OMNIPAQUE  IOHEXOL  350 MG/ML SOLN COMPARISON:  CT of the head dated October 04, 2022. FINDINGS: Orbits: Mild inflammatory stranding in the right periorbital subcutaneous fat. No postseptal inflammatory changes present. The globes and orbits are unremarkable. Visible paranasal  sinuses: Minimal mucoperiosteal thickening within the floor of the left maxillary sinus. Paranasal sinuses are clear otherwise. The mastoid air cells are clear. Soft tissues: Mild right periorbital subcutaneous soft tissue edema. Osseous: Intact and unremarkable.  No osseous lesions present. Limited intracranial: Normal appearance of the visualized brain. IMPRESSION: 1. Mild right periorbital soft tissue stranding compatible with periorbital cellulitis. Electronically Signed   By: Evalene Coho M.D.   On: 01/14/2024 11:32     Procedures   Medications Ordered in the ED  ondansetron  (ZOFRAN ) injection 4 mg (4 mg Intravenous Given 01/14/24 1039)  ketorolac  (TORADOL ) 15 MG/ML injection 15 mg (15 mg Intravenous Given 01/14/24 1039)  iohexol  (OMNIPAQUE ) 350 MG/ML injection 60 mL (60 mLs Intravenous Contrast Given 01/14/24 1058)  sulfamethoxazole -trimethoprim  (BACTRIM  DS) 800-160 MG per tablet 1 tablet (1 tablet Oral Given 01/14/24 1203)  amoxicillin -clavulanate (AUGMENTIN ) 875-125 MG per tablet 1 tablet (1 tablet Oral Given 01/14/24 1203)                                    Medical Decision Making This patient presents to the ED for concern of right periorbital swelling and redness this involves an extensive number of treatment options, and is a complaint that carries with it a high risk of  complications and morbidity.  The differential diagnosis includes periorbital cellulitis, orbital cellulitis, angioedema, contact dermatitis, other   Co morbidities that complicate the patient evaluation  Hepatitis C, hypertension, gastric ulcers   Additional history obtained:  Additional history obtained from EMR External records from outside source obtained and reviewed including prior notes and labs   Lab Tests:  I Ordered, and personally interpreted labs.  The pertinent results include: CBC-no leukocytosis, hemoglobin 9.9 which is patient's baseline,   Imaging Studies ordered:  I ordered imaging studies including CT orbits I independently visualized and interpreted imaging which showed soft tissue stranding and right periorbital soft tissues.  No signs of orbital cellulitis I agree with the radiologist interpretation     Problem List / ED Course / Critical interventions / Medication management  Swelling and redness and the right periorbital soft tissues-clinically consistent with likely periorbital cellulitis.  She is small abrasion on her nose which could be the infection originated.  She is complaining of feeling her eye was bulging out and having pressure behind the eye, remainder of her exam was reassuring but given this complaint ordered a CT to rule out postseptal cellulitis.  This was negative, her vision is 20/20.  She feels better after single dose of Toradol .  Given history of GI bleeding and ulcers will avoid long-term use of NSAIDs.  She is being treated with Bactrim  and Augmentin  for the infection advised on close follow-up and strict return precautions. I ordered medication including Toradol  for pain Reevaluation of the patient after these medicines showed that the patient improved I have reviewed the patients home medicines and have made adjustments as needed     Amount and/or Complexity of Data Reviewed Labs: ordered. Radiology:  ordered.  Risk Prescription drug management.        Final diagnoses:  Periorbital cellulitis of right eye    ED Discharge Orders          Ordered    amoxicillin -clavulanate (AUGMENTIN ) 875-125 MG tablet  Every 12 hours        01/14/24 1159    sulfamethoxazole -trimethoprim  (BACTRIM  DS) 800-160 MG tablet  2 times daily  01/14/24 [address] 01/14/24 1211    Albertina Dixon, MD 01/14/24 [address] PA-C 01/15/24 [address] 01/15/24 1545    Albertina Dixon, MD 01/15/24 2122

## 2024-01-15 ENCOUNTER — Encounter (HOSPITAL_COMMUNITY): Payer: Self-pay | Admitting: Emergency Medicine

## 2024-01-15 ENCOUNTER — Observation Stay (HOSPITAL_COMMUNITY)
Admission: EM | Admit: 2024-01-15 | Discharge: 2024-01-16 | Discharge status: Against Medical Advice | Attending: Internal Medicine | Admitting: Internal Medicine

## 2024-01-15 ENCOUNTER — Other Ambulatory Visit: Payer: Self-pay

## 2024-01-15 DIAGNOSIS — K219 Gastro-esophageal reflux disease without esophagitis: Secondary | ICD-10-CM | POA: Insufficient documentation

## 2024-01-15 DIAGNOSIS — F17210 Nicotine dependence, cigarettes, uncomplicated: Secondary | ICD-10-CM | POA: Insufficient documentation

## 2024-01-15 DIAGNOSIS — I10 Essential (primary) hypertension: Secondary | ICD-10-CM | POA: Insufficient documentation

## 2024-01-15 DIAGNOSIS — H05011 Cellulitis of right orbit: Principal | ICD-10-CM | POA: Insufficient documentation

## 2024-01-15 DIAGNOSIS — Z79899 Other long term (current) drug therapy: Secondary | ICD-10-CM | POA: Insufficient documentation

## 2024-01-15 DIAGNOSIS — L03213 Periorbital cellulitis: Principal | ICD-10-CM

## 2024-01-15 DIAGNOSIS — G629 Polyneuropathy, unspecified: Secondary | ICD-10-CM | POA: Insufficient documentation

## 2024-01-15 DIAGNOSIS — E785 Hyperlipidemia, unspecified: Secondary | ICD-10-CM | POA: Insufficient documentation

## 2024-01-15 DIAGNOSIS — G8929 Other chronic pain: Secondary | ICD-10-CM | POA: Insufficient documentation

## 2024-01-15 LAB — CBC WITH DIFFERENTIAL/PLATELET
Abs Immature Granulocytes: 0.02 10*3/uL (ref 0.00–0.07)
Basophils Absolute: 0.1 10*3/uL (ref 0.0–0.1)
Basophils Relative: 1 %
Eosinophils Absolute: 0.1 10*3/uL (ref 0.0–0.5)
Eosinophils Relative: 1 %
HCT: 32.4 % — ABNORMAL LOW (ref 36.0–46.0)
Hemoglobin: 9.1 g/dL — ABNORMAL LOW (ref 12.0–15.0)
Immature Granulocytes: 0 %
Lymphocytes Relative: 22 %
Lymphs Abs: 1.8 10*3/uL (ref 0.7–4.0)
MCH: 19.6 pg — ABNORMAL LOW (ref 26.0–34.0)
MCHC: 28.1 g/dL — ABNORMAL LOW (ref 30.0–36.0)
MCV: 69.7 fL — ABNORMAL LOW (ref 80.0–100.0)
Monocytes Absolute: 0.5 10*3/uL (ref 0.1–1.0)
Monocytes Relative: 6 %
Neutro Abs: 6.1 10*3/uL (ref 1.7–7.7)
Neutrophils Relative %: 70 %
Platelets: 300 10*3/uL (ref 150–400)
RBC: 4.65 MIL/uL (ref 3.87–5.11)
RDW: 21.5 % — ABNORMAL HIGH (ref 11.5–15.5)
WBC: 8.5 10*3/uL (ref 4.0–10.5)
nRBC: 0 % (ref 0.0–0.2)

## 2024-01-15 LAB — BASIC METABOLIC PANEL WITH GFR
Anion gap: 9 (ref 5–15)
BUN: 8 mg/dL (ref 6–20)
CO2: 26 mmol/L (ref 22–32)
Calcium: 8.9 mg/dL (ref 8.9–10.3)
Chloride: 101 mmol/L (ref 98–111)
Creatinine, Ser: 0.81 mg/dL (ref 0.44–1.00)
GFR, Estimated: >60 mL/min (ref >60)
Glucose, Bld: 136 mg/dL — ABNORMAL HIGH (ref 70–99)
Potassium: 4.5 mmol/L (ref 3.5–5.1)
Sodium: 136 mmol/L (ref 135–145)

## 2024-01-15 LAB — CBC
HCT: 30.7 % — ABNORMAL LOW (ref 36.0–46.0)
Hemoglobin: 8.7 g/dL — ABNORMAL LOW (ref 12.0–15.0)
MCH: 19.7 pg — ABNORMAL LOW (ref 26.0–34.0)
MCHC: 28.3 g/dL — ABNORMAL LOW (ref 30.0–36.0)
MCV: 69.6 fL — ABNORMAL LOW (ref 80.0–100.0)
Platelets: 299 10*3/uL (ref 150–400)
RBC: 4.41 MIL/uL (ref 3.87–5.11)
RDW: 21.2 % — ABNORMAL HIGH (ref 11.5–15.5)
WBC: 8.5 10*3/uL (ref 4.0–10.5)
nRBC: 0 % (ref 0.0–0.2)

## 2024-01-15 LAB — GLUCOSE, CAPILLARY
Glucose-Capillary: 135 mg/dL — ABNORMAL HIGH (ref 70–99)
Glucose-Capillary: 116 mg/dL — ABNORMAL HIGH (ref 70–99)

## 2024-01-15 LAB — HIV ANTIBODY (ROUTINE TESTING W REFLEX): HIV Screen 4th Generation wRfx: NONREACTIVE

## 2024-01-15 LAB — CREATININE, SERUM
Creatinine, Ser: 0.74 mg/dL (ref 0.44–1.00)
GFR, Estimated: >60 mL/min (ref >60)

## 2024-01-15 LAB — HEMOGLOBIN A1C
Hgb A1c MFr Bld: 5.3 % (ref 4.8–5.6)
Mean Plasma Glucose: 105.41 mg/dL

## 2024-01-15 LAB — LACTIC ACID, PLASMA
Lactic Acid, Venous: 2 mmol/L (ref 0.5–1.9)
Lactic Acid, Venous: 1.2 mmol/L (ref 0.5–1.9)

## 2024-01-15 MED ORDER — FERROUS SULFATE 325 (65 FE) MG PO TABS: 325.0000 mg | ORAL_TABLET | Freq: Every day | ORAL | Status: DC

## 2024-01-15 MED ORDER — HYDROCHLOROTHIAZIDE 25 MG PO TABS: 25.0000 mg | ORAL_TABLET | Freq: Every day | ORAL | Status: DC

## 2024-01-15 MED ORDER — NICOTINE 14 MG/24HR TD PT24
14.0000 mg | MEDICATED_PATCH | Freq: Once | TRANSDERMAL | Status: AC
  Administered 2024-01-15: 14 mg via TRANSDERMAL
  Filled 2024-01-15: qty 1

## 2024-01-15 MED ORDER — ONDANSETRON HCL 4 MG PO TABS: 4.0000 mg | ORAL_TABLET | Freq: Four times a day (QID) | ORAL | Status: DC | PRN

## 2024-01-15 MED ORDER — ACETAMINOPHEN 650 MG RE SUPP: 650.0000 mg | Freq: Four times a day (QID) | RECTAL | Status: DC | PRN

## 2024-01-15 MED ORDER — MORPHINE SULFATE (PF) 4 MG/ML IV SOLN
3.0000 mg | INTRAVENOUS | Status: DC | PRN
  Administered 2024-01-15: 3 mg via INTRAVENOUS
  Filled 2024-01-15: qty 1

## 2024-01-15 MED ORDER — PANTOPRAZOLE SODIUM 40 MG PO TBEC: 40.0000 mg | DELAYED_RELEASE_TABLET | Freq: Every day | ORAL | Status: DC

## 2024-01-15 MED ORDER — VANCOMYCIN HCL 1500 MG/300ML IV SOLN
1500.0000 mg | INTRAVENOUS | Status: DC
  Administered 2024-01-15: 1500 mg via INTRAVENOUS
  Filled 2024-01-15: qty 300

## 2024-01-15 MED ORDER — ACETAMINOPHEN 325 MG PO TABS: 650.0000 mg | ORAL_TABLET | Freq: Four times a day (QID) | ORAL | Status: DC | PRN

## 2024-01-15 MED ORDER — INSULIN ASPART 100 UNIT/ML IJ SOLN: 0.0000 [IU] | Freq: Every day | INTRAMUSCULAR | Status: DC

## 2024-01-15 MED ORDER — HEPARIN SODIUM (PORCINE) 5000 UNIT/ML IJ SOLN
5000.0000 [IU] | Freq: Three times a day (TID) | INTRAMUSCULAR | Status: DC
  Administered 2024-01-15 – 2024-01-16 (×2): 5000 [IU] via SUBCUTANEOUS
  Filled 2024-01-15 (×2): qty 1

## 2024-01-15 MED ORDER — GABAPENTIN 400 MG PO CAPS: 400.0000 mg | ORAL_CAPSULE | Freq: Three times a day (TID) | ORAL | Status: DC

## 2024-01-15 MED ORDER — HYDROCODONE-ACETAMINOPHEN 5-325 MG PO TABS
1.0000 | ORAL_TABLET | Freq: Once | ORAL | Status: DC
  Filled 2024-01-15: qty 1

## 2024-01-15 MED ORDER — LISINOPRIL 10 MG PO TABS
10.0000 mg | ORAL_TABLET | Freq: Every day | ORAL | Status: DC
  Administered 2024-01-15 – 2024-01-16 (×2): 10 mg via ORAL
  Filled 2024-01-15 (×2): qty 1

## 2024-01-15 MED ORDER — ALBUTEROL SULFATE (2.5 MG/3ML) 0.083% IN NEBU: 2.5000 mg | INHALATION_SOLUTION | Freq: Four times a day (QID) | RESPIRATORY_TRACT | Status: DC | PRN

## 2024-01-15 MED ORDER — ONDANSETRON HCL 4 MG/2ML IJ SOLN: 4.0000 mg | Freq: Four times a day (QID) | INTRAMUSCULAR | Status: DC | PRN

## 2024-01-15 MED ORDER — FLUTICASONE FUROATE-VILANTEROL 100-25 MCG/ACT IN AEPB
1.0000 | INHALATION_SPRAY | Freq: Every day | RESPIRATORY_TRACT | Status: DC
  Administered 2024-01-16: 1 via RESPIRATORY_TRACT
  Filled 2024-01-15: qty 28

## 2024-01-15 MED ORDER — SODIUM CHLORIDE 0.9 % IV SOLN: INTRAVENOUS | Status: AC

## 2024-01-15 MED ORDER — MORPHINE SULFATE (PF) 4 MG/ML IV SOLN
4.0000 mg | Freq: Once | INTRAVENOUS | Status: AC
  Administered 2024-01-15: 4 mg via INTRAVENOUS
  Filled 2024-01-15: qty 1

## 2024-01-15 MED ORDER — INSULIN ASPART 100 UNIT/ML IJ SOLN: 0.0000 [IU] | Freq: Three times a day (TID) | INTRAMUSCULAR | Status: DC

## 2024-01-15 MED ORDER — BUPRENORPHINE HCL-NALOXONE HCL 8-2 MG SL SUBL
1.0000 | SUBLINGUAL_TABLET | Freq: Three times a day (TID) | SUBLINGUAL | Status: DC
  Administered 2024-01-15 – 2024-01-16 (×3): 1 via SUBLINGUAL
  Filled 2024-01-15 (×3): qty 1

## 2024-01-15 MED ORDER — IBUPROFEN 400 MG PO TABS
400.0000 mg | ORAL_TABLET | Freq: Three times a day (TID) | ORAL | Status: DC
  Administered 2024-01-15 – 2024-01-16 (×2): 400 mg via ORAL
  Filled 2024-01-15 (×3): qty 1

## 2024-01-15 MED ORDER — METHOCARBAMOL 500 MG PO TABS: 500.0000 mg | ORAL_TABLET | Freq: Three times a day (TID) | ORAL | Status: DC | PRN

## 2024-01-15 MED ORDER — GABAPENTIN 300 MG PO CAPS
300.0000 mg | ORAL_CAPSULE | Freq: Three times a day (TID) | ORAL | Status: DC
  Administered 2024-01-15 – 2024-01-16 (×3): 300 mg via ORAL
  Filled 2024-01-15 (×3): qty 1

## 2024-01-15 MED ORDER — ONDANSETRON HCL 4 MG/2ML IJ SOLN
4.0000 mg | Freq: Once | INTRAMUSCULAR | Status: AC
  Administered 2024-01-15: 4 mg via INTRAVENOUS
  Filled 2024-01-15: qty 2

## 2024-01-15 MED ORDER — LEVETIRACETAM 500 MG PO TABS: 500.0000 mg | ORAL_TABLET | Freq: Two times a day (BID) | ORAL | Status: DC

## 2024-01-15 MED ORDER — LISDEXAMFETAMINE DIMESYLATE 20 MG PO CAPS
40.0000 mg | ORAL_CAPSULE | Freq: Every morning | ORAL | Status: DC
  Administered 2024-01-15 – 2024-01-16 (×2): 40 mg via ORAL
  Filled 2024-01-15 (×2): qty 2

## 2024-01-15 MED ORDER — SODIUM CHLORIDE 0.9 % IV SOLN
3.0000 g | Freq: Once | INTRAVENOUS | Status: AC
  Administered 2024-01-15: 3 g via INTRAVENOUS
  Filled 2024-01-15: qty 8

## 2024-01-15 MED ORDER — DULOXETINE HCL 30 MG PO CPEP: 60.0000 mg | ORAL_CAPSULE | Freq: Every day | ORAL | Status: DC

## 2024-01-15 MED ORDER — PANTOPRAZOLE SODIUM 40 MG PO TBEC
40.0000 mg | DELAYED_RELEASE_TABLET | Freq: Two times a day (BID) | ORAL | Status: DC
  Administered 2024-01-15 – 2024-01-16 (×3): 40 mg via ORAL
  Filled 2024-01-15 (×3): qty 1

## 2024-01-15 NOTE — Progress Notes (Signed)
 Pharmacy Antibiotic Note  Mary Jarvis is a 48 y.o. female admitted on 01/15/2024 with periorbital cellulitis.  Pharmacy has been consulted for Vancomycin  dosing.  Plan: Vancomycin  1500 mg IV Q 24 hrs. Goal AUC 400-550. Expected AUC: 478 SCr used: 0.8(actual 0.72) Also ordered unasyn  3gm IV x 1 given in ED F/u cxs and clinical progress Monitor V/S, labs and levels as indicated    Temp (24hrs), Avg:98 F (36.7 C), Min:98 F (36.7 C), Max:98 F (36.7 C)  Recent Labs  Lab 01/14/24 1012  WBC 7.0  CREATININE 0.72    Estimated Creatinine Clearance: 77.4 mL/min (by C-G formula based on SCr of 0.72 mg/dL).    Allergies  Allergen Reactions   Aspirin Other (See Comments)    Cause bleeding have ulcers.   Ibuprofen  Other (See Comments)    Cause bleeding have ulcers   Tramadol  Nausea Only, Palpitations and Other (See Comments)    Cause bleeding have ulcers   Ancef [Cefazolin] Hives   Bee Venom     Antimicrobials this admission: Vancomycin  6/21 >>  unasyn  6/21    Microbiology results: 6/21 BCx: pending  MRSA PCR:   Thank you for allowing pharmacy to be a part of this patient's care.  Dierdra Salameh, BS Pharm D, BCPS Clinical Pharmacist 01/15/2024 10:43 AM

## 2024-01-15 NOTE — Plan of Care (Signed)

## 2024-01-15 NOTE — H&P (Signed)
 History and Physical    Patient: Mary Jarvis FMW:[id number] DOB: 01/11/1976 DOA: 01/15/2024 DOS: the patient was seen and examined on 01/15/2024 PCP: Osa Geralds, NP   Patient coming from: Home  Chief Complaint:  Chief Complaint  Patient presents with   Facial Swelling   HPI: Mary Jarvis is a 48 y.o. female with medical history significant of class I obesity, hyperlipidemia, hypertension, history of seizures, tobacco abuse, chronic pain, GERD/peptic ulcer disease and prediabetes; presented to the hospital secondary to right facial periorbital cellulitis. Patient seen for the same problem 01/14/2024 instructed to take antibiotics orally as prescribed and to use cold compresses; symptoms worsen throughout the night and following recommendations and red flags return to the hospital for further evaluation and management.  Worsening swelling appreciated along with generalized redness and 10 sensation.  Patient reports episodes of nausea and vomiting at home with associated chills.  Patient denies hematemesis, melena, hematochezia, dysuria, hematuria, abdominal pain, chest pain, shortness of breath, productive coughing spells, focal weaknesses or any other complaints.  Cultures taken, IV antibiotics and IV fluids has been started; TRH contacted to place in the hospital for further evaluation and management.  Review of Systems: As mentioned in the history of present illness. All other systems reviewed and are negative. Past Medical History:  Diagnosis Date   Chronic pain syndrome    Gastric ulcer    Generalized anxiety disorder    Hepatitis C    Hyperlipidemia    Hypertension    Kidney stones    Liver failure (HCC)    Peripheral neuropathy    Seizures (HCC)    Tachycardia    Past Surgical History:  Procedure Laterality Date   FOOT SURGERY     rt foot surgery     TUBAL LIGATION     Social History:  reports that she has been smoking cigarettes. She has never used smokeless  tobacco. She reports that she does not currently use alcohol. She reports that she does not currently use drugs after having used the following drugs: Marijuana.  Allergies  Allergen Reactions   Aspirin Other (See Comments)    Cause bleeding have ulcers.   Ibuprofen  Other (See Comments)    Cause bleeding have ulcers   Tramadol  Nausea Only, Palpitations and Other (See Comments)    Cause bleeding have ulcers   Ancef [Cefazolin] Hives   Bee Venom     Family History  Problem Relation Age of Onset   Heart disease Mother    Diabetes Mother    Peripheral Artery Disease Father     Prior to Admission medications   Medication Sig Start Date End Date Taking? Authorizing Provider  albuterol  (PROVENTIL ) (2.5 MG/3ML) 0.083% nebulizer solution Take 3 mLs (2.5 mg total) by nebulization every 6 (six) hours as needed for wheezing or shortness of breath. 02/28/19   Delorise Morna LABOR, PA-C  albuterol  (VENTOLIN  HFA) 108 (90 Base) MCG/ACT inhaler Inhale 1-2 puffs into the lungs every 6 (six) hours as needed for wheezing or shortness of breath. 02/28/19   Delorise Morna LABOR, PA-C  amoxicillin -clavulanate (AUGMENTIN ) 875-125 MG tablet Take 1 tablet by mouth every 12 (twelve) hours. 01/14/24   Suellen Cantor A, PA-C  buprenorphine -naloxone  (SUBOXONE ) 8-2 mg SUBL SL tablet Place 1 tablet under the tongue 3 (three) times daily. 12/06/20   [provider]  doxycycline  (VIBRAMYCIN ) 100 MG capsule Take 1 capsule (100 mg total) by mouth 2 (two) times daily. 05/04/22   Zackowski, Scott, MD  DULoxetine (CYMBALTA) 60 MG  capsule Take 60 mg by mouth daily. 11/12/21   [provider]  ferrous sulfate  325 (65 FE) MG tablet Take 1 tablet (325 mg total) by mouth daily. 11/17/21   Dean Clarity, MD  gabapentin  (NEURONTIN ) 100 MG capsule Take 2 caps every 8 hrs Patient not taking: Reported on 11/17/2021 05/01/21   Margrette Taft BRAVO, MD  gabapentin  (NEURONTIN ) 400 MG capsule Take 400 mg by mouth 4 (four) times  daily. 11/07/21   [provider]  hydrochlorothiazide  (HYDRODIURIL ) 25 MG tablet Take 1 tablet (25 mg total) by mouth daily. 11/17/21   Haviland, Julie, MD  levETIRAcetam  (KEPPRA  XR) 500 MG 24 hr tablet Take 500 mg by mouth 2 (two) times daily. 01/31/21   [provider]  liraglutide (VICTOZA) 18 MG/3ML SOPN Inject into the skin as directed.    [provider]  methocarbamol  (ROBAXIN ) 500 MG tablet Take 1 tablet (500 mg total) by mouth every 8 (eight) hours as needed for muscle spasms. 11/17/21   Melvenia Motto, MD  oxymetazoline  (AFRIN NASAL SPRAY) 0.05 % nasal spray Place 1 spray into both nostrils 2 (two) times daily. 07/06/23   Silver Wonda LABOR, PA  pantoprazole  (PROTONIX ) 20 MG tablet Take 1 tablet (20 mg total) by mouth daily. Patient not taking: Reported on 11/17/2021 01/12/19   Carlyle Lenis, MD  predniSONE  (DELTASONE ) 20 MG tablet Take 2 tablets (40 mg total) by mouth daily with breakfast. 07/07/23   Silver Wonda LABOR, PA  sulfamethoxazole -trimethoprim  (BACTRIM  DS) 800-160 MG tablet Take 1 tablet by mouth 2 (two) times daily for 7 days. 01/14/24 01/21/24  Suellen Cantor A, PA-C  trolamine salicylate (ASPERCREME) 10 % cream Apply 1 application topically as needed for muscle pain.    [provider]    Physical Exam: Vitals:   01/15/24 9166 01/15/24 0850 01/15/24 0851  BP: (!) 130/94    Pulse: 89 (!) 101   Resp:   18  Temp:   98 F (36.7 C)  TempSrc:   Oral  SpO2: 98% 99%    General exam: Alert, awake, oriented x 3; afebrile and complaining of significant pressure discomfort on the right side of her face. Respiratory system: No wheezing or crackles; good saturation on room air. Cardiovascular system: Sinus tachycardia, no rubs, no gallops, no JVD. Gastrointestinal system: Abdomen is nondistended, soft and nontender. No organomegaly or masses felt. Normal bowel sounds heard. Central nervous system:  No focal neurological deficits. Extremities: No  cyanosis or clubbing. Skin: No periorbital swelling and erythematous changes appreciated; no fluctuation or acute drainage appreciated on exam. Psychiatry: Judgement and insight appear normal. Mood & affect appropriate.    Data Reviewed: Lactic acid: 2.0 >> 1.2 CBC: WBCs 8.5, hemoglobin 9.1 and platelet count 300 K Basic metabolic panel: Sodium 136, potassium 4.5, chloride 101, bicarb 26, BUN 8, glucose 136, creatinine 0.81 and GFR >60  Assessment and Plan: 1-periorbital cellulitis - Continue analgesics and anti-inflammatory - Follow culture results - Continue IV antibiotics - Fluid resuscitation and supportive care will be provided; follow clinical response. - No blood abrasion or visual losses reported.  It was no erythema, conjunctivitis of active eye drainage.  2-hypertension -Continue lisinopril  -Heart healthy diet low-sodium diet discussed with patient. - Follow vital signs.  3-hyperlipidemia - Continue statin.  4-GERD/peptic ulcer disease - Continue PPI.  5-tobacco abuse - Cessation counseling provided - Nicotine  patch has been prescribed.  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  7-asthma - No acute exacerbation appreciated - Continue as needed albuterol  and maintenance therapy with Breo Ellipta.   Advance Care Planning:   Code Status: Full Code   Consults: None  Family Communication: Daughter at bedside  Severity of Illness: The appropriate patient status for this patient is INPATIENT. Inpatient status is judged to be reasonable and necessary in order to provide the required intensity of service to ensure the patient's safety. The patient's presenting symptoms, physical exam findings, and initial radiographic and laboratory data in the context of their chronic  comorbidities is felt to place them at high risk for further clinical deterioration. Furthermore, it is not anticipated that the patient will be medically stable for discharge from the hospital within 2 midnights of admission.   * I certify that at the point of admission it is my clinical judgment that the patient will require inpatient hospital care spanning beyond 2 midnights from the point of admission due to high intensity of service, high risk for further deterioration and high frequency of surveillance required.*  Author: Eric Nunnery, MD 01/15/2024 12:27 PM  For on call review [email or web address].

## 2024-01-15 NOTE — Progress Notes (Signed)
   01/15/24 1751  TOC Brief Assessment  Insurance and Status Reviewed  Patient has primary care physician Yes  Home environment has been reviewed From home  Prior level of function: Independent  Prior/Current Home Services No current home services  Social Drivers of Health Review SDOH reviewed interventions complete (smoking cessation added to AVS)  Readmission risk has been reviewed Yes  Transition of care needs no transition of care needs at this time   Transition of Care Department [facility]) has reviewed patient and no TOC needs have been identified at this time. We will continue to monitor patient advancement through interdisciplinary progression rounds. If new patient transition needs arise, please place a TOC consult.

## 2024-01-15 NOTE — ED Provider Notes (Signed)
 [place] EMERGENCY DEPARTMENT AT [facility] Provider Note   CSN: [id number] Arrival date & time: 01/15/24  9184     Patient presents with: Facial Swelling   Mary Jarvis is a 47 y.o. female. She has history of hypertension, hepatitis C, chronic pain, seizures, and gastric ulcers.  Presents the ER complaining of increased swelling and redness to the right orbital area.  Seen yesterday and had labs and CT at that time that showed periorbital cellulitis.   She was started on antibiotics yesterday and has been compliant with this but woke up at 2 AM with worsening pain and swelling, feeling like she had a fever and with nausea and vomiting.  She denies blurry vision but states is hard to see out of the eye because of the eyelid swelling blocking part of her vision.   HPI     Prior to Admission medications   Medication Sig Start Date End Date Taking? Authorizing Provider  albuterol  (PROVENTIL ) (2.5 MG/3ML) 0.083% nebulizer solution Take 3 mLs (2.5 mg total) by nebulization every 6 (six) hours as needed for wheezing or shortness of breath. 02/28/19   Delorise Morna LABOR, PA-C  albuterol  (VENTOLIN  HFA) 108 (90 Base) MCG/ACT inhaler Inhale 1-2 puffs into the lungs every 6 (six) hours as needed for wheezing or shortness of breath. 02/28/19   Delorise Morna LABOR, PA-C  amoxicillin -clavulanate (AUGMENTIN ) 875-125 MG tablet Take 1 tablet by mouth every 12 (twelve) hours. 01/14/24   Suellen Cantor A, PA-C  buprenorphine -naloxone  (SUBOXONE ) 8-2 mg SUBL SL tablet Place 1 tablet under the tongue 3 (three) times daily. 12/06/20   [provider]  doxycycline  (VIBRAMYCIN ) 100 MG capsule Take 1 capsule (100 mg total) by mouth 2 (two) times daily. 05/04/22   Zackowski, Scott, MD  DULoxetine (CYMBALTA) 60 MG capsule Take 60 mg by mouth daily. 11/12/21   [provider]  ferrous sulfate  325 (65 FE) MG tablet Take 1 tablet (325 mg total) by mouth daily. 11/17/21   Dean Clarity, MD   gabapentin  (NEURONTIN ) 100 MG capsule Take 2 caps every 8 hrs Patient not taking: Reported on 11/17/2021 05/01/21   Margrette Taft BRAVO, MD  gabapentin  (NEURONTIN ) 400 MG capsule Take 400 mg by mouth 4 (four) times daily. 11/07/21   [provider]  hydrochlorothiazide  (HYDRODIURIL ) 25 MG tablet Take 1 tablet (25 mg total) by mouth daily. 11/17/21   Dean Clarity, MD  levETIRAcetam  (KEPPRA  XR) 500 MG 24 hr tablet Take 500 mg by mouth 2 (two) times daily. 01/31/21   [provider]  liraglutide (VICTOZA) 18 MG/3ML SOPN Inject into the skin as directed.    [provider]  methocarbamol  (ROBAXIN ) 500 MG tablet Take 1 tablet (500 mg total) by mouth every 8 (eight) hours as needed for muscle spasms. 11/17/21   Melvenia Motto, MD  oxymetazoline  (AFRIN NASAL SPRAY) 0.05 % nasal spray Place 1 spray into both nostrils 2 (two) times daily. 07/06/23   Silver Wonda LABOR, PA  pantoprazole  (PROTONIX ) 20 MG tablet Take 1 tablet (20 mg total) by mouth daily. Patient not taking: Reported on 11/17/2021 01/12/19   Carlyle Lenis, MD  predniSONE  (DELTASONE ) 20 MG tablet Take 2 tablets (40 mg total) by mouth daily with breakfast. 07/07/23   Silver Wonda LABOR, PA  sulfamethoxazole -trimethoprim  (BACTRIM  DS) 800-160 MG tablet Take 1 tablet by mouth 2 (two) times daily for 7 days. 01/14/24 01/21/24  Suellen Cantor A, PA-C  trolamine salicylate (ASPERCREME) 10 % cream Apply 1 application topically as needed  for muscle pain.    [provider]    Allergies: Aspirin, Ibuprofen , Tramadol , Ancef [cefazolin], and Bee venom    Review of Systems  Updated Vital Signs BP (!) 130/94   Pulse (!) 101   Temp 98 F (36.7 C) (Oral)   Resp 18   LMP 09/25/2023 (Exact Date)   SpO2 99%   Physical Exam Vitals and nursing note reviewed.  Constitutional:      General: She is not in acute distress.    Appearance: She is well-developed.  HENT:     Head: Normocephalic and atraumatic.     Mouth/Throat:      Mouth: Mucous membranes are moist.   Eyes:     Extraocular Movements: Extraocular movements intact.     Right eye: Normal extraocular motion.     Left eye: Normal extraocular motion.     Conjunctiva/sclera: Conjunctivae normal.     Pupils: Pupils are equal, round, and reactive to light.     Comments: Right periorbital swelling and redness. There is induration without fluctuance   Cardiovascular:     Rate and Rhythm: Normal rate and regular rhythm.     Heart sounds: No murmur heard. Pulmonary:     Effort: Pulmonary effort is normal. No respiratory distress.     Breath sounds: Normal breath sounds.  Abdominal:     Palpations: Abdomen is soft.     Tenderness: There is no abdominal tenderness.   Musculoskeletal:        General: No swelling.     Cervical back: Neck supple.   Skin:    General: Skin is warm and dry.     Capillary Refill: Capillary refill takes less than 2 seconds.   Neurological:     General: No focal deficit present.     Mental Status: She is alert and oriented to person, place, and time.   Psychiatric:        Mood and Affect: Mood normal.     (all labs ordered are listed, but only abnormal results are displayed) Labs Reviewed  CULTURE, BLOOD (ROUTINE X 2)  CULTURE, BLOOD (ROUTINE X 2)  CBC WITH DIFFERENTIAL/PLATELET  BASIC METABOLIC PANEL WITH GFR  LACTIC ACID, PLASMA  LACTIC ACID, PLASMA    EKG: None  Radiology: CT Orbits W Contrast Result Date: 01/14/2024 CLINICAL DATA:  Right eye pain and redness and periorbital cellulitis. EXAM: CT ORBITS WITH CONTRAST TECHNIQUE: Multidetector CT images was performed according to the standard protocol following intravenous contrast administration. RADIATION DOSE REDUCTION: This exam was performed according to the departmental dose-optimization program which includes automated exposure control, adjustment of the mA and/or kV according to patient size and/or use of iterative reconstruction technique. CONTRAST:   60mL OMNIPAQUE  IOHEXOL  350 MG/ML SOLN COMPARISON:  CT of the head dated October 04, 2022. FINDINGS: Orbits: Mild inflammatory stranding in the right periorbital subcutaneous fat. No postseptal inflammatory changes present. The globes and orbits are unremarkable. Visible paranasal sinuses: Minimal mucoperiosteal thickening within the floor of the left maxillary sinus. Paranasal sinuses are clear otherwise. The mastoid air cells are clear. Soft tissues: Mild right periorbital subcutaneous soft tissue edema. Osseous: Intact and unremarkable.  No osseous lesions present. Limited intracranial: Normal appearance of the visualized brain. IMPRESSION: 1. Mild right periorbital soft tissue stranding compatible with periorbital cellulitis. Electronically Signed   By: Evalene Coho M.D.   On: 01/14/2024 11:32     Procedures   Medications Ordered in the ED  morphine  (PF) 4 MG/ML injection 4 mg (  has no administration in time range)  Ampicillin -Sulbactam (UNASYN ) 3 g in sodium chloride  0.9 % 100 mL IVPB (has no administration in time range)  ondansetron  (ZOFRAN ) injection 4 mg (has no administration in time range)                                    Medical Decision Making Differential diagnosis includes but not limited to periorbital cellulitis, orbital cellulitis, sepsis, contact dermatitis, other  Patient presents for worsening right periorbital swelling and pain, started antibiotics yesterday but is worsening.  She was noting nausea vomiting and subjective fever at home, though is afebrile here,.  Her labs show new leukocytosis, baseline microcytic anemia, normal BMP.  Though she did have initial mild increase in lactic acid is was normal on recheck.  Plan on admission for IV antibiotics for periorbital cellulitis.  I consider need for repeat CT, patient had a CT of orbits yesterday, she has normal extraocular movements, I do not suspect orbital cellulitis but feel she needs admission for IV  antibiotics.  I discussed with Dr. Ricky who admit for IV antibiotics.    Amount and/or Complexity of Data Reviewed External Data Reviewed: labs, radiology and notes. Labs: ordered. Decision-making details documented in ED Course. Discussion of management or test interpretation with external provider(s): Patient was given morphine  for pain with improvement in her symptoms.  Risk OTC drugs. Prescription drug management. Decision regarding hospitalization.        Final diagnoses:  None    ED Discharge Orders     None          Suellen Sherran DELENA DEVONNA 01/15/24 1314    Yolande Lamar BROCKS, MD 01/16/24 [phone]

## 2024-01-15 NOTE — ED Triage Notes (Signed)
 Pt states she was seen yesterday for cellulitis of her R side of face/eye and was told to come back if Sx had progressed. States she now has chills and N/V. States she was put on ABX but states it feels like it made it worse and now she cannot see out of her R eye due to the swelling. Rates pain 7/10

## 2024-01-16 DIAGNOSIS — L03213 Periorbital cellulitis: Secondary | ICD-10-CM | POA: Diagnosis not present

## 2024-01-16 DIAGNOSIS — H05011 Cellulitis of right orbit: Secondary | ICD-10-CM | POA: Diagnosis not present

## 2024-01-16 LAB — GLUCOSE, CAPILLARY: Glucose-Capillary: 109 mg/dL — ABNORMAL HIGH (ref 70–99)

## 2024-01-16 LAB — BASIC METABOLIC PANEL WITH GFR
Anion gap: 7 (ref 5–15)
BUN: 7 mg/dL (ref 6–20)
CO2: 24 mmol/L (ref 22–32)
Calcium: 8.5 mg/dL — ABNORMAL LOW (ref 8.9–10.3)
Chloride: 103 mmol/L (ref 98–111)
Creatinine, Ser: 0.63 mg/dL (ref 0.44–1.00)
GFR, Estimated: >60 mL/min (ref >60)
Glucose, Bld: 98 mg/dL (ref 70–99)
Potassium: 4 mmol/L (ref 3.5–5.1)
Sodium: 134 mmol/L — ABNORMAL LOW (ref 135–145)

## 2024-01-16 LAB — CBC
HCT: 26.9 % — ABNORMAL LOW (ref 36.0–46.0)
Hemoglobin: 7.5 g/dL — ABNORMAL LOW (ref 12.0–15.0)
MCH: 19.2 pg — ABNORMAL LOW (ref 26.0–34.0)
MCHC: 27.9 g/dL — ABNORMAL LOW (ref 30.0–36.0)
MCV: 69 fL — ABNORMAL LOW (ref 80.0–100.0)
Platelets: 238 10*3/uL (ref 150–400)
RBC: 3.9 MIL/uL (ref 3.87–5.11)
RDW: 20.8 % — ABNORMAL HIGH (ref 11.5–15.5)
WBC: 6.4 10*3/uL (ref 4.0–10.5)
nRBC: 0 % (ref 0.0–0.2)

## 2024-01-16 NOTE — Discharge Summary (Signed)
 Physician Discharge Summary   Patient: Mary Jarvis MRN: [id number] DOB: [birth year]  Admit date:     01/15/2024  Discharge date: 01/16/24  Discharge Physician: Eric Nunnery   PCP: Osa Geralds, NP   Recommendations at discharge:  Patient left AGAINST MEDICAL ADVICE.  Discharge Diagnoses: Principal Problem:   Orbital cellulitis on right Tobacco abuse Prediabetes Class I obesity GERD Chronic pain/neuropathy Hypertension History of asthma  Hospital Course: Mary Jarvis is a 48 y.o. female with medical history significant of class I obesity, hyperlipidemia, hypertension, history of seizures, tobacco abuse, chronic pain, GERD/peptic ulcer disease and prediabetes; presented to the hospital secondary to right facial periorbital cellulitis. Patient seen for the same problem 01/14/2024 instructed to take antibiotics orally as prescribed and to use cold compresses; symptoms worsen throughout the night and following recommendations and red flags return to the hospital for further evaluation and management.   Worsening swelling appreciated along with generalized redness and 10 sensation.  Patient reports episodes of nausea and vomiting at home with associated chills.  Patient denies hematemesis, melena, hematochezia, dysuria, hematuria, abdominal pain, chest pain, shortness of breath, productive coughing spells, focal weaknesses or any other complaints.   Cultures taken, IV antibiotics and IV fluids has been started; TR[facility] contacted to place in the hospital for further evaluation and management.  Assessment and Plan: 1-periorbital cellulitis - Patient was adequately responding to treatment with IV antibiotics -Afebrile and demonstrating normal WBCs. - She got frustrated of not being able to smoke or have the type of diet that she would like and left AGAINST MEDICAL ADVICE. -On recently previous visit to ED she was started on adequate oral antibiotics; hoping that she will resume and less  complete that treatment for the close follow-up with PCP and if needed ophthalmologist/ENT. - No new prescriptions for analgesics or antibiotics were able to be provided a patient left AGAINST MEDICAL ADVICE.  Consultants: None Procedures performed: See below for x-ray reports. Disposition: Home Diet recommendation: Patient left AGAINST MEDICAL ADVICE. (Heart healthy/low-sodium diet and modified carbohydrate discussed at time of admission).  DISCHARGE MEDICATION: Allergies as of 01/16/2024       Reactions   Aspirin Other (See Comments)   Cause bleeding have ulcers.   Ibuprofen  Other (See Comments)   Cause bleeding have ulcers   Tramadol  Nausea Only, Palpitations, Other (See Comments)   Cause bleeding have ulcers   Ancef [cefazolin] Hives   Bee Venom         Medication List     ASK your doctor about these medications    albuterol  108 (90 Base) MCG/ACT inhaler Commonly known as: VENTOLIN  HFA Inhale 1-2 puffs into the lungs every 6 (six) hours as needed for wheezing or shortness of breath. Ask about: Which instructions should I use?   amoxicillin -clavulanate 875-125 MG tablet Commonly known as: AUGMENTIN  Take 1 tablet by mouth every 12 (twelve) hours.   buprenorphine -naloxone  8-2 mg Subl SL tablet Commonly known as: SUBOXONE  Place 1 tablet under the tongue 3 (three) times daily.   gabapentin  300 MG capsule Commonly known as: NEURONTIN  Take 300 mg by mouth 3 (three) times daily. Ask about: Which instructions should I use?   lisinopril  10 MG tablet Commonly known as: ZESTRIL  Take 10 mg by mouth daily.   nicotine  21 mg/24hr patch Commonly known as: NICODERM CQ  - dosed in mg/24 hours Place 21 mg onto the skin daily.   omeprazole 40 MG capsule Commonly known as: PRILOSEC Take 40 mg by mouth daily.   sulfamethoxazole -trimethoprim  800-160  MG tablet Commonly known as: BACTRIM  DS Take 1 tablet by mouth 2 (two) times daily for 7 days.   Symbicort 80-4.5 MCG/ACT  inhaler Generic drug: budesonide-formoterol Inhale 2 puffs into the lungs daily.   trolamine salicylate 10 % cream Commonly known as: ASPERCREME Apply 1 application topically as needed for muscle pain.   Vyvanse  40 MG capsule Generic drug: lisdexamfetamine Take 40 mg by mouth every morning.   Wegovy 2.4 MG/0.75ML Soaj Generic drug: Semaglutide-Weight Management Inject 2.4 mg into the skin every Monday.        Discharge Exam: Filed Weights   01/15/24 1354  Weight: 71.1 kg   Patient left AGAINST MEDICAL ADVICE.  Condition at discharge: good  The results of significant diagnostics from this hospitalization (including imaging, microbiology, ancillary and laboratory) are listed below for reference.   Imaging Studies: CT Orbits W Contrast Result Date: 01/14/2024 CLINICAL DATA:  Right eye pain and redness and periorbital cellulitis. EXAM: CT ORBITS WITH CONTRAST TECHNIQUE: Multidetector CT images was performed according to the standard protocol following intravenous contrast administration. RADIATION DOSE REDUCTION: This exam was performed according to the departmental dose-optimization program which includes automated exposure control, adjustment of the mA and/or kV according to patient size and/or use of iterative reconstruction technique. CONTRAST:  60mL OMNIPAQUE  IOHEXOL  350 MG/ML SOLN COMPARISON:  CT of the head dated October 04, 2022. FINDINGS: Orbits: Mild inflammatory stranding in the right periorbital subcutaneous fat. No postseptal inflammatory changes present. The globes and orbits are unremarkable. Visible paranasal sinuses: Minimal mucoperiosteal thickening within the floor of the left maxillary sinus. Paranasal sinuses are clear otherwise. The mastoid air cells are clear. Soft tissues: Mild right periorbital subcutaneous soft tissue edema. Osseous: Intact and unremarkable.  No osseous lesions present. Limited intracranial: Normal appearance of the visualized brain. IMPRESSION:  1. Mild right periorbital soft tissue stranding compatible with periorbital cellulitis. Electronically Signed   By: Evalene Coho M.D.   On: 01/14/2024 11:32    Microbiology: Results for orders placed or performed during the hospital encounter of 01/15/24  Blood culture (routine x 2)     Status: None (Preliminary result)   Collection Time: 01/15/24 10:21 AM   Specimen: Right Antecubital; Blood  Result Value Ref Range Status   Specimen Description   Final    RIGHT ANTECUBITAL BOTTLES DRAWN AEROBIC AND ANAEROBIC   Special Requests Blood Culture adequate volume  Final   Culture   Final    NO GROWTH < 24 HOURS Performed at [facility], [address] [place], [place] [address]    Report Status PENDING  Incomplete  Blood culture (routine x 2)     Status: None (Preliminary result)   Collection Time: 01/15/24 10:49 AM   Specimen: Left Antecubital; Blood  Result Value Ref Range Status   Specimen Description   Final    LEFT ANTECUBITAL BOTTLES DRAWN AEROBIC AND ANAEROBIC   Special Requests Blood Culture adequate volume  Final   Culture   Final    NO GROWTH < 24 HOURS Performed at [facility], [address] [place], [place] [address]    Report Status PENDING  Incomplete    Labs: CBC: Recent Labs  Lab 01/14/24 1012 01/15/24 1008 01/15/24 1319 01/16/24 0331  WBC 7.0 8.5 8.5 6.4  NEUTROABS 4.9 6.1  --   --   HGB 9.4* 9.1* 8.7* 7.5*  HCT 33.2* 32.4* 30.7* 26.9*  MCV 70.0* 69.7* 69.6* 69.0*  PLT 302 300 299 238   Basic Metabolic Panel: Recent  Labs  Lab 01/14/24 1012 01/15/24 1008 01/15/24 1319 01/16/24 0331  NA 138 136  --  134*  K 4.4 4.5  --  4.0  CL 101 101  --  103  CO2 27 26  --  24  GLUCOSE 88 136*  --  98  BUN 7 8  --  7  CREATININE 0.72 0.81 0.74 0.63  CALCIUM 9.0 8.9  --  8.5*   CBG: Recent Labs  Lab 01/15/24 1634 01/15/24 2028 01/16/24 0734  GLUCAP 135* 116* 109*    Discharge time spent: less than 30 minutes.  Signed: Eric Nunnery,  MD [facility] 01/16/2024

## 2024-01-16 NOTE — Plan of Care (Signed)
  Problem: Pain Managment: Goal: General experience of comfort will improve and/or be controlled Outcome: Progressing   Problem: Safety: Goal: Ability to remain free from injury will improve Outcome: Progressing   Problem: Education: Goal: Ability to describe self-care measures that may prevent or decrease complications (Diabetes Survival Skills Education) will improve Outcome: Progressing

## 2024-01-18 DIAGNOSIS — M5412 Radiculopathy, cervical region: Secondary | ICD-10-CM | POA: Diagnosis not present

## 2024-01-18 DIAGNOSIS — Z6830 Body mass index (BMI) 30.0-30.9, adult: Secondary | ICD-10-CM | POA: Diagnosis not present

## 2024-01-21 LAB — CULTURE, BLOOD (ROUTINE X 2)
Culture: NO GROWTH
Culture: NO GROWTH
Special Requests: ADEQUATE
Special Requests: ADEQUATE

## 2024-02-22 DIAGNOSIS — Z6831 Body mass index (BMI) 31.0-31.9, adult: Secondary | ICD-10-CM | POA: Diagnosis not present

## 2024-02-22 DIAGNOSIS — M5412 Radiculopathy, cervical region: Secondary | ICD-10-CM | POA: Diagnosis not present

## 2024-02-29 DIAGNOSIS — B86 Scabies: Secondary | ICD-10-CM | POA: Diagnosis not present

## 2024-02-29 DIAGNOSIS — B882 Other arthropod infestations: Secondary | ICD-10-CM | POA: Diagnosis not present

## 2024-03-01 DIAGNOSIS — B86 Scabies: Secondary | ICD-10-CM | POA: Diagnosis not present

## 2024-03-01 DIAGNOSIS — L299 Pruritus, unspecified: Secondary | ICD-10-CM | POA: Diagnosis not present

## 2024-04-12 ENCOUNTER — Other Ambulatory Visit: Payer: Self-pay | Admitting: Neurosurgery

## 2024-04-14 ENCOUNTER — Encounter (HOSPITAL_COMMUNITY): Payer: Self-pay | Admitting: Neurosurgery

## 2024-04-14 NOTE — Progress Notes (Signed)
 Instructions only  Patient verbally denies any shortness of breath, fever, cough and chest pain during phone call   -------------  SDW INSTRUCTIONS given:  Your procedure is scheduled on Monday, Sept 22nd.  Report to [facility] Main Entrance A at 0830 A.M., and check in at the Admitting office.  Call this number if you have problems the morning of surgery:  [phone]   Remember:  Do not eat after midnight the night before your surgery  You may drink clear liquids until 0800 the morning of your surgery.   Clear liquids allowed are: Water, Non-Citrus Juices (without pulp), Carbonated Beverages, Clear Tea, Black Coffee Only, and Gatorade    Take these medicines the morning of surgery with A SIP OF WATER  buprenorphine -naloxone  (SUBOXONE )  pregabalin (LYRICA)  SYMBICORT  albuterol  (VENTOLIN  HFA)-if needed (please bring you on the day of surgery)  As of today, STOP taking any Aspirin (unless otherwise instructed by your surgeon) Aleve , Naproxen , Ibuprofen , Motrin , Advil , Goody's, BC's, all herbal medications, fish oil, and all vitamins.                      Do not wear jewelry, make up, or nail polish            Do not wear lotions, powders, perfumes/colognes, or deodorant.            Do not shave 48 hours prior to surgery.  Men may shave face and neck.            Do not bring valuables to the hospital.            [facility] is not responsible for any belongings or valuables.  Do NOT Smoke (Tobacco/Vaping) 24 hours prior to your procedure If you use a CPAP at night, you may bring all equipment for your overnight stay.   Contacts, glasses, dentures or bridgework may not be worn into surgery.      For patients admitted to the hospital, discharge time will be determined by your treatment team.   Patients discharged the day of surgery will not be allowed to drive home, and someone needs to stay with them for 24 hours.    Special instructions:   [place]- Preparing For  Surgery  Before surgery, you can play an important role. Because skin is not sterile, your skin needs to be as free of germs as possible. You can reduce the number of germs on your skin by washing with CHG (chlorahexidine gluconate) Soap before surgery.  CHG is an antiseptic cleaner which kills germs and bonds with the skin to continue killing germs even after washing.    Oral Hygiene is also important to reduce your risk of infection.  Remember - BRUSH YOUR TEETH THE MORNING OF SURGERY WITH YOUR REGULAR TOOTHPASTE  Please do not use if you have an allergy to CHG or antibacterial soaps. If your skin becomes reddened/irritated stop using the CHG.  Do not shave (including legs and underarms) for at least 48 hours prior to first CHG shower. It is OK to shave your face.  Please follow these instructions carefully.   Shower the NIGHT BEFORE SURGERY and the MORNING OF SURGERY with DIAL Soap.   Pat yourself dry with a CLEAN TOWEL.  Wear CLEAN PAJAMAS to bed the night before surgery  Place CLEAN SHEETS on your bed the night of your first shower and DO NOT SLEEP WITH PETS.   Day of Surgery: Please shower morning of  surgery  Wear Clean/Comfortable clothing the morning of surgery Do not apply any deodorants/lotions.   Remember to brush your teeth WITH YOUR REGULAR TOOTHPASTE.   Questions were answered. Patient verbalized understanding of instructions.

## 2024-04-17 ENCOUNTER — Inpatient Hospital Stay (HOSPITAL_COMMUNITY)
Admission: RE | Disposition: A | Discharge status: Home / Self Care | Payer: Self-pay | Source: Home / Self Care | Attending: Neurosurgery

## 2024-04-17 ENCOUNTER — Inpatient Hospital Stay (HOSPITAL_COMMUNITY)
Admission: RE | Admit: 2024-04-17 | Discharge: 2024-04-18 | DRG: 472 | Disposition: A | Discharge status: Home / Self Care | Attending: Neurosurgery | Admitting: Neurosurgery

## 2024-04-17 ENCOUNTER — Encounter (HOSPITAL_COMMUNITY): Payer: Self-pay | Admitting: Neurosurgery

## 2024-04-17 ENCOUNTER — Other Ambulatory Visit: Payer: Self-pay

## 2024-04-17 ENCOUNTER — Inpatient Hospital Stay (HOSPITAL_COMMUNITY): Payer: Self-pay | Admitting: Vascular Surgery

## 2024-04-17 ENCOUNTER — Inpatient Hospital Stay (HOSPITAL_COMMUNITY)

## 2024-04-17 DIAGNOSIS — Z79899 Other long term (current) drug therapy: Secondary | ICD-10-CM

## 2024-04-17 DIAGNOSIS — Z91030 Bee allergy status: Secondary | ICD-10-CM | POA: Diagnosis not present

## 2024-04-17 DIAGNOSIS — R569 Unspecified convulsions: Secondary | ICD-10-CM | POA: Diagnosis not present

## 2024-04-17 DIAGNOSIS — Z833 Family history of diabetes mellitus: Secondary | ICD-10-CM

## 2024-04-17 DIAGNOSIS — Z885 Allergy status to narcotic agent status: Secondary | ICD-10-CM | POA: Diagnosis not present

## 2024-04-17 DIAGNOSIS — M5412 Radiculopathy, cervical region: Secondary | ICD-10-CM | POA: Diagnosis not present

## 2024-04-17 DIAGNOSIS — F17210 Nicotine dependence, cigarettes, uncomplicated: Secondary | ICD-10-CM | POA: Diagnosis present

## 2024-04-17 DIAGNOSIS — G629 Polyneuropathy, unspecified: Secondary | ICD-10-CM | POA: Diagnosis present

## 2024-04-17 DIAGNOSIS — E785 Hyperlipidemia, unspecified: Secondary | ICD-10-CM | POA: Diagnosis present

## 2024-04-17 DIAGNOSIS — Z881 Allergy status to other antibiotic agents status: Secondary | ICD-10-CM | POA: Diagnosis not present

## 2024-04-17 DIAGNOSIS — I10 Essential (primary) hypertension: Secondary | ICD-10-CM | POA: Diagnosis present

## 2024-04-17 DIAGNOSIS — M4722 Other spondylosis with radiculopathy, cervical region: Principal | ICD-10-CM | POA: Diagnosis present

## 2024-04-17 DIAGNOSIS — Z7982 Long term (current) use of aspirin: Secondary | ICD-10-CM

## 2024-04-17 DIAGNOSIS — Z8249 Family history of ischemic heart disease and other diseases of the circulatory system: Secondary | ICD-10-CM

## 2024-04-17 DIAGNOSIS — Z79891 Long term (current) use of opiate analgesic: Secondary | ICD-10-CM

## 2024-04-17 DIAGNOSIS — Z01818 Encounter for other preprocedural examination: Principal | ICD-10-CM

## 2024-04-17 DIAGNOSIS — G894 Chronic pain syndrome: Secondary | ICD-10-CM | POA: Diagnosis present

## 2024-04-17 DIAGNOSIS — M4322 Fusion of spine, cervical region: Secondary | ICD-10-CM | POA: Diagnosis not present

## 2024-04-17 DIAGNOSIS — M2578 Osteophyte, vertebrae: Secondary | ICD-10-CM | POA: Diagnosis present

## 2024-04-17 DIAGNOSIS — Z886 Allergy status to analgesic agent status: Secondary | ICD-10-CM

## 2024-04-17 DIAGNOSIS — Z981 Arthrodesis status: Secondary | ICD-10-CM | POA: Diagnosis not present

## 2024-04-17 DIAGNOSIS — M4712 Other spondylosis with myelopathy, cervical region: Secondary | ICD-10-CM | POA: Diagnosis present

## 2024-04-17 DIAGNOSIS — Z7951 Long term (current) use of inhaled steroids: Secondary | ICD-10-CM | POA: Diagnosis not present

## 2024-04-17 DIAGNOSIS — M4802 Spinal stenosis, cervical region: Secondary | ICD-10-CM | POA: Diagnosis not present

## 2024-04-17 DIAGNOSIS — K219 Gastro-esophageal reflux disease without esophagitis: Secondary | ICD-10-CM | POA: Diagnosis present

## 2024-04-17 HISTORY — DX: Gastro-esophageal reflux disease without esophagitis: K21.9

## 2024-04-17 HISTORY — DX: Other psychoactive substance abuse, uncomplicated: F19.10

## 2024-04-17 HISTORY — PX: ANTERIOR CERVICAL DECOMP/DISCECTOMY FUSION: SHX1161

## 2024-04-17 HISTORY — DX: Prediabetes: R73.03

## 2024-04-17 LAB — COMPREHENSIVE METABOLIC PANEL WITH GFR
ALT: 17 U/L (ref 0–44)
AST: 32 U/L (ref 15–41)
Albumin: 3.6 g/dL (ref 3.5–5.0)
Alkaline Phosphatase: 91 U/L (ref 38–126)
Anion gap: 10 (ref 5–15)
BUN: 6 mg/dL (ref 6–20)
CO2: 25 mmol/L (ref 22–32)
Calcium: 8.8 mg/dL — ABNORMAL LOW (ref 8.9–10.3)
Chloride: 102 mmol/L (ref 98–111)
Creatinine, Ser: 0.66 mg/dL (ref 0.44–1.00)
GFR, Estimated: >60 mL/min (ref >60)
Glucose, Bld: 93 mg/dL (ref 70–99)
Potassium: 3.7 mmol/L (ref 3.5–5.1)
Sodium: 137 mmol/L (ref 135–145)
Total Bilirubin: 0.9 mg/dL (ref 0.0–1.2)
Total Protein: 6.8 g/dL (ref 6.5–8.1)

## 2024-04-17 LAB — SURGICAL PCR SCREEN
MRSA, PCR: POSITIVE — AB
Staphylococcus aureus: POSITIVE — AB

## 2024-04-17 LAB — TYPE AND SCREEN
ABO/RH(D): A NEG
Antibody Screen: NEGATIVE
Unit division: 0
Unit division: 0

## 2024-04-17 LAB — RAPID URINE DRUG SCREEN, HOSP PERFORMED
Amphetamines: POSITIVE — AB
Barbiturates: NOT DETECTED
Benzodiazepines: NOT DETECTED
Cocaine: NOT DETECTED
Opiates: NOT DETECTED
Tetrahydrocannabinol: POSITIVE — AB

## 2024-04-17 LAB — CBC
HCT: 28.1 % — ABNORMAL LOW (ref 36.0–46.0)
Hemoglobin: 7.9 g/dL — ABNORMAL LOW (ref 12.0–15.0)
MCH: 18.8 pg — ABNORMAL LOW (ref 26.0–34.0)
MCHC: 28.1 g/dL — ABNORMAL LOW (ref 30.0–36.0)
MCV: 66.9 fL — ABNORMAL LOW (ref 80.0–100.0)
Platelets: 208 K/uL (ref 150–400)
RBC: 4.2 MIL/uL (ref 3.87–5.11)
RDW: 20 % — ABNORMAL HIGH (ref 11.5–15.5)
WBC: 6.6 K/uL (ref 4.0–10.5)
nRBC: 0 % (ref 0.0–0.2)

## 2024-04-17 LAB — GLUCOSE, CAPILLARY
Glucose-Capillary: 148 mg/dL — ABNORMAL HIGH (ref 70–99)
Glucose-Capillary: 166 mg/dL — ABNORMAL HIGH (ref 70–99)

## 2024-04-17 LAB — BPAM RBC
Blood Product Expiration Date: 202510082359
Blood Product Expiration Date: 202510092359
ISSUE DATE / TIME: 202509221142
ISSUE DATE / TIME: 202509221142
Unit Type and Rh: 600
Unit Type and Rh: 600

## 2024-04-17 LAB — POCT PREGNANCY, URINE: Preg Test, Ur: NEGATIVE

## 2024-04-17 LAB — ABO/RH: ABO/RH(D): A NEG

## 2024-04-17 LAB — PREPARE RBC (CROSSMATCH)

## 2024-04-17 SURGERY — ANTERIOR CERVICAL DECOMPRESSION/DISCECTOMY FUSION 3 LEVELS
Anesthesia: General

## 2024-04-17 MED ORDER — SUGAMMADEX SODIUM 200 MG/2ML IV SOLN
INTRAVENOUS | Status: DC | PRN
  Administered 2024-04-17 (×2): 200 mg via INTRAVENOUS

## 2024-04-17 MED ORDER — PHENYLEPHRINE HCL-NACL 20-0.9 MG/250ML-% IV SOLN: INTRAVENOUS | Status: DC | PRN

## 2024-04-17 MED ORDER — SODIUM CHLORIDE 0.9 % IV SOLN
0.1500 ug/kg/min | INTRAVENOUS | Status: DC
  Filled 2024-04-17: qty 2000

## 2024-04-17 MED ORDER — FLUTICASONE FUROATE-VILANTEROL 100-25 MCG/ACT IN AEPB
1.0000 | INHALATION_SPRAY | Freq: Every day | RESPIRATORY_TRACT | Status: DC
  Filled 2024-04-17: qty 28

## 2024-04-17 MED ORDER — ALBUTEROL SULFATE (2.5 MG/3ML) 0.083% IN NEBU: 3.0000 mL | INHALATION_SOLUTION | Freq: Four times a day (QID) | RESPIRATORY_TRACT | Status: DC | PRN

## 2024-04-17 MED ORDER — FENTANYL CITRATE (PF) 250 MCG/5ML IJ SOLN
INTRAMUSCULAR | Status: AC
  Filled 2024-04-17: qty 5

## 2024-04-17 MED ORDER — ROCURONIUM BROMIDE 10 MG/ML (PF) SYRINGE
PREFILLED_SYRINGE | INTRAVENOUS | Status: DC | PRN
  Administered 2024-04-17: 50 mg via INTRAVENOUS
  Administered 2024-04-17: 30 mg via INTRAVENOUS
  Administered 2024-04-17: 20 mg via INTRAVENOUS

## 2024-04-17 MED ORDER — ACETAMINOPHEN 325 MG PO TABS: 650.0000 mg | ORAL_TABLET | ORAL | Status: DC | PRN

## 2024-04-17 MED ORDER — FENTANYL CITRATE (PF) 250 MCG/5ML IJ SOLN
INTRAMUSCULAR | Status: DC | PRN
  Administered 2024-04-17: 50 ug via INTRAVENOUS
  Administered 2024-04-17 (×2): 100 ug via INTRAVENOUS

## 2024-04-17 MED ORDER — SODIUM CHLORIDE 0.9 % IV SOLN
0.1500 ug/kg/min | INTRAVENOUS | Status: AC
  Administered 2024-04-17: .05 ug/kg/min via INTRAVENOUS
  Administered 2024-04-17: .1 ug/kg/min via INTRAVENOUS
  Filled 2024-04-17: qty 2000

## 2024-04-17 MED ORDER — ROCURONIUM BROMIDE 10 MG/ML (PF) SYRINGE
PREFILLED_SYRINGE | INTRAVENOUS | Status: AC
  Filled 2024-04-17: qty 10

## 2024-04-17 MED ORDER — PROPOFOL 10 MG/ML IV BOLUS
INTRAVENOUS | Status: AC
  Filled 2024-04-17: qty 20

## 2024-04-17 MED ORDER — PREGABALIN 75 MG PO CAPS
150.0000 mg | ORAL_CAPSULE | Freq: Two times a day (BID) | ORAL | Status: DC
Start: 2024-04-17 — End: 2024-04-18
  Administered 2024-04-17 – 2024-04-18 (×2): 150 mg via ORAL
  Filled 2024-04-17 (×2): qty 2

## 2024-04-17 MED ORDER — MENTHOL 3 MG MT LOZG: 1.0000 | LOZENGE | OROMUCOSAL | Status: DC | PRN

## 2024-04-17 MED ORDER — OXYCODONE HCL 5 MG PO TABS: 5.0000 mg | ORAL_TABLET | Freq: Once | ORAL | Status: DC | PRN

## 2024-04-17 MED ORDER — CHLORHEXIDINE GLUCONATE 4 % EX SOLN: 1.0000 | CUTANEOUS | 1 refills | Status: AC

## 2024-04-17 MED ORDER — PANTOPRAZOLE SODIUM 40 MG PO TBEC: 40.0000 mg | DELAYED_RELEASE_TABLET | Freq: Every day | ORAL | Status: DC

## 2024-04-17 MED ORDER — KETAMINE HCL 50 MG/5ML IJ SOSY
PREFILLED_SYRINGE | INTRAMUSCULAR | Status: AC
  Filled 2024-04-17: qty 5

## 2024-04-17 MED ORDER — ONDANSETRON HCL 4 MG PO TABS: 4.0000 mg | ORAL_TABLET | Freq: Four times a day (QID) | ORAL | Status: DC | PRN

## 2024-04-17 MED ORDER — LACTATED RINGERS IV SOLN
INTRAVENOUS | Status: DC | PRN
Start: 2024-04-17 — End: 2024-04-17

## 2024-04-17 MED ORDER — SODIUM CHLORIDE 0.9% FLUSH: 3.0000 mL | INTRAVENOUS | Status: DC | PRN

## 2024-04-17 MED ORDER — LIDOCAINE 2% (20 MG/ML) 5 ML SYRINGE
INTRAMUSCULAR | Status: AC
  Filled 2024-04-17: qty 5

## 2024-04-17 MED ORDER — LACTATED RINGERS IV SOLN: INTRAVENOUS | Status: DC | PRN

## 2024-04-17 MED ORDER — CLINDAMYCIN PHOSPHATE 900 MG/50ML IV SOLN
900.0000 mg | Freq: Once | INTRAVENOUS | Status: AC
  Administered 2024-04-17: 900 mg via INTRAVENOUS
  Filled 2024-04-17: qty 50

## 2024-04-17 MED ORDER — CHLORHEXIDINE GLUCONATE CLOTH 2 % EX PADS: 6.0000 | MEDICATED_PAD | Freq: Once | CUTANEOUS | Status: DC

## 2024-04-17 MED ORDER — ONDANSETRON HCL 4 MG/2ML IJ SOLN: 4.0000 mg | Freq: Four times a day (QID) | INTRAMUSCULAR | Status: DC | PRN

## 2024-04-17 MED ORDER — CHLORHEXIDINE GLUCONATE 0.12 % MT SOLN
OROMUCOSAL | Status: AC
  Filled 2024-04-17: qty 15

## 2024-04-17 MED ORDER — PROPOFOL 500 MG/50ML IV EMUL
INTRAVENOUS | Status: DC | PRN
  Administered 2024-04-17: 150 ug/kg/min via INTRAVENOUS

## 2024-04-17 MED ORDER — ASPIRIN 81 MG PO TBEC
81.0000 mg | DELAYED_RELEASE_TABLET | Freq: Every day | ORAL | Status: DC
  Administered 2024-04-17 – 2024-04-18 (×2): 81 mg via ORAL
  Filled 2024-04-17 (×2): qty 1

## 2024-04-17 MED ORDER — FIXODENT COMPLETE CREA: 1.0000 | TOPICAL_CREAM | Freq: Every day | Status: DC

## 2024-04-17 MED ORDER — ONDANSETRON HCL 4 MG/2ML IJ SOLN
INTRAMUSCULAR | Status: DC | PRN
  Administered 2024-04-17: 4 mg via INTRAVENOUS

## 2024-04-17 MED ORDER — ACETAMINOPHEN 500 MG PO TABS
1000.0000 mg | ORAL_TABLET | Freq: Once | ORAL | Status: AC
Start: 2024-04-17 — End: 2024-04-17
  Administered 2024-04-17: 1000 mg via ORAL
  Filled 2024-04-17: qty 2

## 2024-04-17 MED ORDER — PANTOPRAZOLE SODIUM 40 MG PO TBEC
40.0000 mg | DELAYED_RELEASE_TABLET | Freq: Every day | ORAL | Status: DC
  Administered 2024-04-17: 40 mg via ORAL
  Filled 2024-04-17: qty 1

## 2024-04-17 MED ORDER — VANCOMYCIN HCL 750 MG/150ML IV SOLN
750.0000 mg | Freq: Once | INTRAVENOUS | Status: AC
  Administered 2024-04-17: 750 mg via INTRAVENOUS
  Filled 2024-04-17: qty 150

## 2024-04-17 MED ORDER — ALUM & MAG HYDROXIDE-SIMETH 200-200-20 MG/5ML PO SUSP: 30.0000 mL | Freq: Four times a day (QID) | ORAL | Status: DC | PRN

## 2024-04-17 MED ORDER — HYDROMORPHONE HCL 1 MG/ML IJ SOLN: 0.5000 mg | INTRAMUSCULAR | Status: DC | PRN

## 2024-04-17 MED ORDER — DEXMEDETOMIDINE HCL IN NACL 80 MCG/20ML IV SOLN
INTRAVENOUS | Status: DC | PRN
  Administered 2024-04-17: 12 ug via INTRAVENOUS
  Administered 2024-04-17: 8 ug via INTRAVENOUS

## 2024-04-17 MED ORDER — THROMBIN 20000 UNITS EX KIT
PACK | CUTANEOUS | Status: AC
  Filled 2024-04-17: qty 1

## 2024-04-17 MED ORDER — LORATADINE 10 MG PO TABS
10.0000 mg | ORAL_TABLET | Freq: Every day | ORAL | Status: DC
  Administered 2024-04-17: 10 mg via ORAL
  Filled 2024-04-17: qty 1

## 2024-04-17 MED ORDER — OXYCODONE HCL 5 MG PO TABS
10.0000 mg | ORAL_TABLET | ORAL | Status: DC | PRN
  Administered 2024-04-17 – 2024-04-18 (×4): 10 mg via ORAL
  Filled 2024-04-17 (×4): qty 2

## 2024-04-17 MED ORDER — KETAMINE HCL 50 MG/5ML IJ SOSY
PREFILLED_SYRINGE | INTRAMUSCULAR | Status: DC | PRN
  Administered 2024-04-17: 15 mg via INTRAVENOUS
  Administered 2024-04-17: 35 mg via INTRAVENOUS

## 2024-04-17 MED ORDER — THROMBIN 5000 UNITS EX KIT
PACK | CUTANEOUS | Status: AC
  Filled 2024-04-17: qty 1

## 2024-04-17 MED ORDER — VANCOMYCIN HCL IN DEXTROSE 1-5 GM/200ML-% IV SOLN
INTRAVENOUS | Status: AC
  Filled 2024-04-17: qty 200

## 2024-04-17 MED ORDER — HYDROMORPHONE HCL 1 MG/ML IJ SOLN
0.2500 mg | INTRAMUSCULAR | Status: DC | PRN
  Administered 2024-04-17: 0.5 mg via INTRAVENOUS

## 2024-04-17 MED ORDER — THROMBIN 5000 UNITS EX SOLR
OROMUCOSAL | Status: DC | PRN
  Administered 2024-04-17 (×2): 5 mL via TOPICAL

## 2024-04-17 MED ORDER — HYDROCHLOROTHIAZIDE 12.5 MG PO TABS
12.5000 mg | ORAL_TABLET | Freq: Every evening | ORAL | Status: DC
  Administered 2024-04-17: 12.5 mg via ORAL
  Filled 2024-04-17: qty 1

## 2024-04-17 MED ORDER — LISINOPRIL 10 MG PO TABS
10.0000 mg | ORAL_TABLET | Freq: Every morning | ORAL | Status: DC
  Administered 2024-04-18: 10 mg via ORAL
  Filled 2024-04-17: qty 1

## 2024-04-17 MED ORDER — MIDAZOLAM HCL 2 MG/2ML IJ SOLN
INTRAMUSCULAR | Status: DC | PRN
  Administered 2024-04-17: 2 mg via INTRAVENOUS

## 2024-04-17 MED ORDER — SODIUM CHLORIDE 0.9 % IV SOLN
250.0000 mL | INTRAVENOUS | Status: DC
Start: 2024-04-17 — End: 2024-04-18
  Administered 2024-04-17: 250 mL via INTRAVENOUS

## 2024-04-17 MED ORDER — LISDEXAMFETAMINE DIMESYLATE 20 MG PO CAPS
40.0000 mg | ORAL_CAPSULE | Freq: Every morning | ORAL | Status: DC
  Administered 2024-04-18: 40 mg via ORAL
  Filled 2024-04-17: qty 2

## 2024-04-17 MED ORDER — PANTOPRAZOLE SODIUM 40 MG IV SOLR: 40.0000 mg | Freq: Every day | INTRAVENOUS | Status: DC

## 2024-04-17 MED ORDER — DEXAMETHASONE SODIUM PHOSPHATE 10 MG/ML IJ SOLN
INTRAMUSCULAR | Status: DC | PRN
  Administered 2024-04-17: 10 mg via INTRAVENOUS

## 2024-04-17 MED ORDER — AMISULPRIDE (ANTIEMETIC) 5 MG/2ML IV SOLN: 10.0000 mg | Freq: Once | INTRAVENOUS | Status: DC | PRN

## 2024-04-17 MED ORDER — CEFAZOLIN SODIUM-DEXTROSE 2-4 GM/100ML-% IV SOLN: 2.0000 g | INTRAVENOUS | Status: DC

## 2024-04-17 MED ORDER — CYCLOBENZAPRINE HCL 10 MG PO TABS
10.0000 mg | ORAL_TABLET | Freq: Three times a day (TID) | ORAL | Status: DC | PRN
  Administered 2024-04-17: 10 mg via ORAL
  Filled 2024-04-17: qty 1

## 2024-04-17 MED ORDER — PHENYLEPHRINE HCL-NACL 20-0.9 MG/250ML-% IV SOLN
INTRAVENOUS | Status: DC | PRN
  Administered 2024-04-17: 30 ug/min via INTRAVENOUS

## 2024-04-17 MED ORDER — OXYCODONE HCL 5 MG/5ML PO SOLN: 5.0000 mg | Freq: Once | ORAL | Status: DC | PRN

## 2024-04-17 MED ORDER — ACETAMINOPHEN 650 MG RE SUPP: 650.0000 mg | RECTAL | Status: DC | PRN

## 2024-04-17 MED ORDER — VANCOMYCIN HCL IN DEXTROSE 1-5 GM/200ML-% IV SOLN
1000.0000 mg | Freq: Once | INTRAVENOUS | Status: AC
  Administered 2024-04-17: 1000 mg via INTRAVENOUS

## 2024-04-17 MED ORDER — MUPIROCIN 2 % EX OINT: 1.0000 | TOPICAL_OINTMENT | Freq: Two times a day (BID) | CUTANEOUS | 0 refills | Status: AC

## 2024-04-17 MED ORDER — THROMBIN 20000 UNITS EX SOLR
CUTANEOUS | Status: AC
  Filled 2024-04-17: qty 20000

## 2024-04-17 MED ORDER — 0.9 % SODIUM CHLORIDE (POUR BTL) OPTIME
TOPICAL | Status: DC | PRN
  Administered 2024-04-17: 1000 mL

## 2024-04-17 MED ORDER — ADULT MULTIVITAMIN W/MINERALS CH
1.0000 | ORAL_TABLET | Freq: Every morning | ORAL | Status: DC
Start: 2024-04-18 — End: 2024-04-18
  Administered 2024-04-18: 1 via ORAL
  Filled 2024-04-17: qty 1

## 2024-04-17 MED ORDER — COLLAGEN-VITAMIN C-BIOTIN 500-50-0.8 MG PO CAPS: ORAL_CAPSULE | Freq: Every morning | ORAL | Status: DC

## 2024-04-17 MED ORDER — ONDANSETRON HCL 4 MG/2ML IJ SOLN
INTRAMUSCULAR | Status: AC
  Filled 2024-04-17: qty 2

## 2024-04-17 MED ORDER — PHENOL 1.4 % MT LIQD: 1.0000 | OROMUCOSAL | Status: DC | PRN

## 2024-04-17 MED ORDER — BUPRENORPHINE HCL-NALOXONE HCL 8-2 MG SL SUBL
1.0000 | SUBLINGUAL_TABLET | Freq: Three times a day (TID) | SUBLINGUAL | Status: DC
  Administered 2024-04-17 – 2024-04-18 (×3): 1 via SUBLINGUAL
  Filled 2024-04-17 (×3): qty 1

## 2024-04-17 MED ORDER — SODIUM CHLORIDE 0.9% FLUSH
3.0000 mL | Freq: Two times a day (BID) | INTRAVENOUS | Status: DC
  Administered 2024-04-17: 3 mL via INTRAVENOUS

## 2024-04-17 MED ORDER — DEXAMETHASONE SODIUM PHOSPHATE 10 MG/ML IJ SOLN
INTRAMUSCULAR | Status: AC
  Filled 2024-04-17: qty 1

## 2024-04-17 MED ORDER — PROPOFOL 10 MG/ML IV BOLUS
INTRAVENOUS | Status: DC | PRN
Start: 2024-04-17 — End: 2024-04-17
  Administered 2024-04-17: 150 ug via INTRAVENOUS

## 2024-04-17 MED ORDER — MIDAZOLAM HCL 2 MG/2ML IJ SOLN
INTRAMUSCULAR | Status: AC
  Filled 2024-04-17: qty 2

## 2024-04-17 MED ORDER — HYDROMORPHONE HCL 1 MG/ML IJ SOLN
INTRAMUSCULAR | Status: AC
  Filled 2024-04-17: qty 1

## 2024-04-17 MED ORDER — NICOTINE 21 MG/24HR TD PT24
21.0000 mg | MEDICATED_PATCH | Freq: Every morning | TRANSDERMAL | Status: DC
  Administered 2024-04-18: 21 mg via TRANSDERMAL
  Filled 2024-04-17: qty 1

## 2024-04-17 MED ORDER — CEFAZOLIN SODIUM-DEXTROSE 2-4 GM/100ML-% IV SOLN: 2.0000 g | Freq: Three times a day (TID) | INTRAVENOUS | Status: DC

## 2024-04-17 MED ORDER — THROMBIN 20000 UNITS EX SOLR
CUTANEOUS | Status: DC | PRN
  Administered 2024-04-17: 20 mL via TOPICAL

## 2024-04-17 MED ORDER — AM/PM PERIMENOPAUSE FORMULA PO TABS: ORAL_TABLET | Freq: Every morning | ORAL | Status: DC

## 2024-04-17 MED ORDER — THROMBIN (RECOMBINANT) 20000 UNITS EX SOLR
CUTANEOUS | Status: AC
  Filled 2024-04-17: qty 20000

## 2024-04-17 MED ORDER — LIDOCAINE 2% (20 MG/ML) 5 ML SYRINGE
INTRAMUSCULAR | Status: DC | PRN
  Administered 2024-04-17: 100 mg via INTRAVENOUS

## 2024-04-17 SURGICAL SUPPLY — 52 items
BAG COUNTER SPONGE SURGICOUNT (BAG) ×1 IMPLANT
BAND RUBBER #18 3X1/16 STRL (MISCELLANEOUS) ×2 IMPLANT
BASKET BONE COLLECTION (BASKET) ×1 IMPLANT
BENZOIN TINCTURE PRP APPL 2/3 (GAUZE/BANDAGES/DRESSINGS) ×1 IMPLANT
BIT DRILL 13 (BIT) IMPLANT
BIT DRILL NEURO 2X3.1 SFT TUCH (MISCELLANEOUS) ×1 IMPLANT
BUR MATCHSTICK NEURO 3.0 LAGG (BURR) ×1 IMPLANT
CANISTER SUCTION 3000ML PPV (SUCTIONS) ×1 IMPLANT
DERMABOND ADVANCED .7 DNX12 (GAUZE/BANDAGES/DRESSINGS) ×1 IMPLANT
DRAIN 7X20 FLAT PERF LF SIL ST (DRAIN) IMPLANT
DRAPE C-ARM 42X72 X-RAY (DRAPES) ×2 IMPLANT
DRAPE LAPAROTOMY 100X72 PEDS (DRAPES) ×1 IMPLANT
DRAPE MICROSCOPE SLANT 54X150 (MISCELLANEOUS) ×1 IMPLANT
DRSG OPSITE POSTOP 4X6 (GAUZE/BANDAGES/DRESSINGS) IMPLANT
DURAPREP 6ML APPLICATOR 50/CS (WOUND CARE) ×1 IMPLANT
ELECT COATED BLADE 2.86 ST (ELECTRODE) ×1 IMPLANT
ELECTRODE REM PT RTRN 9FT ADLT (ELECTROSURGICAL) ×1 IMPLANT
EVACUATOR SILICONE 100CC (DRAIN) IMPLANT
GAUZE 4X4 16PLY ~~LOC~~+RFID DBL (SPONGE) IMPLANT
GAUZE SPONGE 4X4 12PLY STRL (GAUZE/BANDAGES/DRESSINGS) ×1 IMPLANT
GLOVE BIO SURGEON STRL SZ7 (GLOVE) IMPLANT
GLOVE BIO SURGEON STRL SZ8 (GLOVE) ×1 IMPLANT
GLOVE BIOGEL PI IND STRL 7.0 (GLOVE) IMPLANT
GLOVE EXAM NITRILE XL STR (GLOVE) IMPLANT
GLOVE INDICATOR 8.5 STRL (GLOVE) ×1 IMPLANT
GOWN STRL REUS W/ TWL LRG LVL3 (GOWN DISPOSABLE) ×1 IMPLANT
GOWN STRL REUS W/ TWL XL LVL3 (GOWN DISPOSABLE) ×1 IMPLANT
GOWN STRL REUS W/TWL 2XL LVL3 (GOWN DISPOSABLE) IMPLANT
GRAFT BNE MATRIX VG FRMBL SM 1 (Bone Implant) IMPLANT
HALTER HD/CHIN CERV TRACTION D (MISCELLANEOUS) ×1 IMPLANT
HEMOSTAT POWDER KIT SURGIFOAM (HEMOSTASIS) ×1 IMPLANT
KIT BASIN OR (CUSTOM PROCEDURE TRAY) ×1 IMPLANT
KIT TURNOVER KIT B (KITS) ×1 IMPLANT
NDL SPNL 20GX3.5 QUINCKE YW (NEEDLE) ×1 IMPLANT
NEEDLE SPNL 20GX3.5 QUINCKE YW (NEEDLE) ×1 IMPLANT
NS IRRIG 1000ML POUR BTL (IV SOLUTION) ×1 IMPLANT
PACK LAMINECTOMY NEURO (CUSTOM PROCEDURE TRAY) ×1 IMPLANT
PIN DISTRACTION 14MM (PIN) IMPLANT
PLATE 3 57.5XLCK NS SPNE CVD (Plate) IMPLANT
SCREW BN 13X4XSLF DRL FXANG (Screw) IMPLANT
SCREW ST FIX 4 ATL 3120213 (Screw) IMPLANT
SPACER HEDRON C 12X14X6 0D (Spacer) IMPLANT
SPIKE FLUID TRANSFER (MISCELLANEOUS) ×1 IMPLANT
SPONGE INTESTINAL PEANUT (DISPOSABLE) ×1 IMPLANT
SPONGE SURGIFOAM ABS GEL 100 (HEMOSTASIS) ×1 IMPLANT
STRIP CLOSURE SKIN 1/2X4 (GAUZE/BANDAGES/DRESSINGS) ×1 IMPLANT
SUT VIC AB 3-0 SH 8-18 (SUTURE) ×1 IMPLANT
SUT VIC AB 4-0 PS2 27 (SUTURE) ×1 IMPLANT
TAPE CLOTH 4X10 WHT NS (GAUZE/BANDAGES/DRESSINGS) IMPLANT
TOWEL GREEN STERILE (TOWEL DISPOSABLE) ×1 IMPLANT
TOWEL GREEN STERILE FF (TOWEL DISPOSABLE) ×1 IMPLANT
WATER STERILE IRR 1000ML POUR (IV SOLUTION) ×1 IMPLANT

## 2024-04-17 NOTE — Transfer of Care (Cosign Needed)
 Immediate Anesthesia Transfer of Care Note  Patient: Mary Jarvis  Procedure(s) Performed: ANTERIOR CERVICAL DISCECTOMY AND FUSION CERVICAL FOUR-FIVE/CERVICAL FIVE-SIX/CERVICAL SIX-SEVEN  Patient Location: PACU  Anesthesia Type:General  Level of Consciousness: awake and drowsy  Airway & Oxygen  Therapy: Patient Spontanous Breathing and Patient connected to face mask oxygen   Post-op Assessment: Report given to RN, Post -op Vital signs reviewed and stable, and Patient moving all extremities X 4  Post vital signs: Reviewed and stable  Last Vitals:  Vitals Value Taken Time  BP 135/82 04/17/24 14:45  Temp    Pulse 86 04/17/24 14:53  Resp 11 04/17/24 14:53  SpO2 100 % 04/17/24 14:53  Vitals shown include unfiled device data.  Last Pain:  Vitals:   04/17/24 0945  TempSrc:   PainSc: 5          Complications: No notable events documented.

## 2024-04-17 NOTE — Progress Notes (Signed)
 Orthopedic Tech Progress Note Patient Details:  Mary Jarvis 11-Feb-1976 [id number]  Ortho Devices Type of Ortho Device: Soft collar Ortho Device/Splint Location: NECK Ortho Device/Splint Interventions: Ordered, Application, Adjustment   Post Interventions Patient Tolerated: Well Instructions Provided: Care of device  [name] Pac 04/17/2024, 5:32 PM

## 2024-04-17 NOTE — Progress Notes (Signed)
 Pharmacy Antibiotic Note  Mary Jarvis is a 48 y.o. female admitted on 04/17/2024 with surgical prophylaxis.  Pharmacy has been consulted for Vancomycin  dosing. Pt has JP drain but consult specifically states x 1 post-op dose.   Plan: Vancomycin  750mg  IV x 1 Pharmacy will sign off - please reconsult if needed   Height: 5' (152.4 cm) Weight: 71.7 kg (158 lb) IBW/kg (Calculated) : 45.5  Temp (24hrs), Avg:98.4 F (36.9 C), Min:97.7 F (36.5 C), Max:99.5 F (37.5 C)  Recent Labs  Lab 04/17/24 1007  WBC 6.6  CREATININE 0.66    Estimated Creatinine Clearance: 77.7 mL/min (by C-G formula based on SCr of 0.66 mg/dL).    Allergies  Allergen Reactions   Aspirin  Other (See Comments)    Cause bleeding have ulcers.   Ibuprofen  Other (See Comments)    Cause bleeding have ulcers   Tramadol  Nausea Only, Palpitations and Other (See Comments)    Cause bleeding have ulcers   Ancef  [Cefazolin ] Hives and Other (See Comments)    Felt like my skin was on fire   Bee Venom      Thank you for allowing pharmacy to be a part of this patient's care.  Vito Ralph, PharmD, BCPS Please see amion for complete clinical pharmacist phone list 04/17/2024 4:48 PM

## 2024-04-17 NOTE — Anesthesia Postprocedure Evaluation (Signed)
 Anesthesia Post Note  Patient: Mary Jarvis  Procedure(s) Performed: ANTERIOR CERVICAL DISCECTOMY AND FUSION CERVICAL FOUR-FIVE/CERVICAL FIVE-SIX/CERVICAL SIX-SEVEN     Patient location during evaluation: PACU Anesthesia Type: General Level of consciousness: awake Pain management: pain level controlled Vital Signs Assessment: post-procedure vital signs reviewed and stable Respiratory status: spontaneous breathing, nonlabored ventilation and respiratory function stable Cardiovascular status: blood pressure returned to baseline and stable Postop Assessment: no apparent nausea or vomiting Anesthetic complications: no   No notable events documented.  Last Vitals:  Vitals:   04/17/24 1545 04/17/24 1600  BP: 137/84 133/83  Pulse: 90 81  Resp: (!) 23 15  Temp:  36.7 C  SpO2: 97% 94%    Last Pain:  Vitals:   04/17/24 1546  TempSrc:   PainSc: 7                  Delon Aisha Arch

## 2024-04-17 NOTE — H&P (Signed)
 Mary Jarvis is an 48 y.o. female.   Chief Complaint: Neck bilateral shoulder and arm pain HPI: 48 year old female progressive worsening neck and bilateral shoulder and arm pain workup revealed severe cervical spondylosis over multiple level C4-5 C5-6 C6-7.  Due to patient's progression of clinical syndrome imaging findings failed conservative treatment I recommended anterior cervical discectomies and fusion at those 3 levels.  I extensively reviewed the risks and benefits of the operation with her as well as perioperative course expectations of outcome and alternatives to surgery and she understood and agreed to proceed forward.  Past Medical History:  Diagnosis Date   Chronic pain syndrome    Gastric ulcer    Generalized anxiety disorder    GERD (gastroesophageal reflux disease)    Hepatitis C    Hyperlipidemia    Hypertension    Kidney stones    Liver failure (HCC)    Peripheral neuropathy    Pre-diabetes    Seizures (HCC)    Tachycardia     Past Surgical History:  Procedure Laterality Date   FOOT SURGERY     rt foot surgery     TUBAL LIGATION      Family History  Problem Relation Age of Onset   Heart disease Mother    Diabetes Mother    Peripheral Artery Disease Father    Social History:  reports that she has been smoking cigarettes. She has never used smokeless tobacco. She reports that she does not currently use alcohol. She reports that she does not currently use drugs after having used the following drugs: Marijuana.  Allergies:  Allergies  Allergen Reactions   Aspirin  Other (See Comments)    Cause bleeding have ulcers.   Ibuprofen  Other (See Comments)    Cause bleeding have ulcers   Tramadol  Nausea Only, Palpitations and Other (See Comments)    Cause bleeding have ulcers   Ancef  [Cefazolin ] Hives and Other (See Comments)    Felt like my skin was on fire   Bee Venom     Medications Prior to Admission  Medication Sig Dispense Refill   albuterol  (VENTOLIN   HFA) 108 (90 Base) MCG/ACT inhaler Inhale 1-2 puffs into the lungs every 6 (six) hours as needed for wheezing or shortness of breath. 6.7 g 2   buprenorphine -naloxone  (SUBOXONE ) 8-2 mg SUBL SL tablet Place 1 tablet under the tongue 3 (three) times daily.     Collagen-Vitamin C -Biotin  (COLLAGEN PO) Take 1 capsule by mouth in the morning. Obvi Collagenic Burn Elite     Denture Care Products (FIXODENT COMPLETE) CREA Place 1 Application onto teeth daily. Applied to bottom row     hydrochlorothiazide  (HYDRODIURIL ) 12.5 MG tablet Take 12.5 mg by mouth every evening.     lisinopril  (ZESTRIL ) 10 MG tablet Take 10 mg by mouth in the morning.     loratadine  (CLARITIN ) 10 MG tablet Take 10 mg by mouth at bedtime.     Misc Natural Products (AM/PM PERIMENOPAUSE FORMULA PO) Take 1 capsule by mouth in the morning. Olly Balanced Perimeno     Multiple Vitamin (MULTIVITAMIN WITH MINERALS) TABS tablet Take 1 tablet by mouth in the morning. Women's Multivitamin     nicotine  (NICODERM CQ  - DOSED IN MG/24 HOURS) 21 mg/24hr patch Place 21 mg onto the skin in the morning.     pregabalin  (LYRICA ) 150 MG capsule Take 150 mg by mouth 2 (two) times daily.     SYMBICORT 80-4.5 MCG/ACT inhaler Inhale 1 puff into the lungs in the morning and  at bedtime.     VYVANSE  40 MG capsule Take 40 mg by mouth every morning.     aspirin  EC 81 MG tablet Take 81 mg by mouth daily. Swallow whole.     omeprazole (PRILOSEC) 40 MG capsule Take 40 mg by mouth every evening.      No results found for this or any previous visit (from the past 48 hours). No results found.  Review of Systems  Musculoskeletal:  Positive for neck pain.  Neurological:  Positive for numbness.    Blood pressure (!) 157/98, pulse (!) 117, temperature 98.4 F (36.9 C), temperature source Oral, resp. rate 18, height 5' (1.524 m), weight 71.7 kg, SpO2 94%. Physical Exam HENT:     Head: Normocephalic.     Right Ear: Tympanic membrane normal.     Nose: Nose normal.   Eyes:     Pupils: Pupils are equal, round, and reactive to light.  Cardiovascular:     Pulses: Normal pulses.  Pulmonary:     Effort: Pulmonary effort is normal.  Abdominal:     General: Abdomen is flat.  Musculoskeletal:        General: Normal range of motion.     Cervical back: Normal range of motion.  Neurological:     Mental Status: She is alert.     Comments: Strength is 5 out of 5 deltoid, bicep, tricep, wrist flexion, wrist extension, hand intrinsics.      Assessment/Plan 48 year old presents for ACDF C4-5 C5-6 C6-7.  Arley SHAUNNA Helling, MD 04/17/2024, 10:00 AM

## 2024-04-17 NOTE — Anesthesia Preprocedure Evaluation (Addendum)
 Anesthesia Evaluation  Patient identified by MRN, date of birth, ID band Patient awake    Reviewed: Allergy & Precautions, NPO status , Patient's Chart, lab work & pertinent test results  History of Anesthesia Complications Negative for: history of anesthetic complications  Airway Mallampati: III  TM Distance: >3 FB Neck ROM: Limited    Dental  (+) Upper Dentures, Lower Dentures   Pulmonary neg shortness of breath, asthma , neg sleep apnea, neg COPD, neg recent URI, Current Smoker and Patient abstained from smoking.   Pulmonary exam normal breath sounds clear to auscultation       Cardiovascular hypertension (HCTZ, lisinopril ), Pt. on medications (-) angina (-) Past MI, (-) Cardiac Stents and (-) CABG (-) dysrhythmias  Rhythm:Regular Rate:Normal  HLD   Neuro/Psych Seizures - (last seizure 07/2023),  PSYCHIATRIC DISORDERS Anxiety     Chronic pain syndrome  Neuromuscular disease (pepripheral neuropathy, cervical radiculopathy)    GI/Hepatic PUD,GERD  Medicated,,(+)     substance abuse  , Hepatitis - (patient reports she was treated for this and cured), CH/o liver failure   Endo/Other  Pre-diabetes  Renal/GU Renal disease (h/o stones)     Musculoskeletal  (+)  narcotic dependent  Abdominal   Peds  Hematology  (+) Blood dyscrasia, anemia Lab Results      Component                Value               Date                      WBC                      6.6                 04/17/2024                HGB                      7.9 (L)             04/17/2024                HCT                      28.1 (L)            04/17/2024                MCV                      66.9 (L)            04/17/2024                PLT                      208                 04/17/2024              Anesthesia Other Findings On Suboxone  - reports this is for h/o opiate use. Reports she has not used in 10 years. Her last dose was last night. She  uses CBD oil but denies any illegal drug use. Chemistry analyzer down so unable to obtain Utox but patient denies drug use.  Reproductive/Obstetrics  Anesthesia Physical Anesthesia Plan  ASA: 3  Anesthesia Plan: General   Post-op Pain Management: Tylenol  PO (pre-op)*, Ketamine  IV* and Dilaudid  IV   Induction: Intravenous  PONV Risk Score and Plan: 2 and Ondansetron , Dexamethasone , Midazolam  and Treatment may vary due to age or medical condition  Airway Management Planned: Oral ETT and Video Laryngoscope Planned  Additional Equipment:   Intra-op Plan:   Post-operative Plan: Extubation in OR  Informed Consent: I have reviewed the patients History and Physical, chart, labs and discussed the procedure including the risks, benefits and alternatives for the proposed anesthesia with the patient or authorized representative who has indicated his/her understanding and acceptance.     Dental advisory given  Plan Discussed with: CRNA and Anesthesiologist  Anesthesia Plan Comments: (Risks of general anesthesia discussed including, but not limited to, sore throat, hoarse voice, chipped/damaged teeth, injury to vocal cords, nausea and vomiting, allergic reactions, lung infection, heart attack, stroke, and death. All questions answered. )         Anesthesia Quick Evaluation

## 2024-04-17 NOTE — Plan of Care (Signed)

## 2024-04-17 NOTE — Op Note (Signed)
 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  Postoperative diagnosis: Same.  Procedure: Anterior cervical discectomies and fusion at C4-5 C5-6 C6-7 utilizing titanium cages packed with locally harvested autograft mixed with Vivigen and anterior cervical plating utilizing the [employer] Atlantis translational plating system.  Surgeon: Arley helling.  Assistant: Suzen Click.  Anesthesia: General.  EBL: Minimal.  HPI: 48 year old female with severe neck and left greater than right arm pain workup revealed severe cervical spondylosis cord compression severe foraminal stenosis worse on the left at C4-5 C5-6 and C6-7.  Due to patient's progression of clinical syndrome imaging findings of failed conservative treatment I recommended anterior cervical discectomies and fusion at those 3 levels.  I extensively reviewed the risks and benefits of the operation with the patient as well as perioperative course expectations of outcome and alternatives to surgery and he understood and agreed to proceed forward.  Operative procedure: Patient was brought into the OR was induced under general anesthesia positioned supine neck in slight extension 5 pounds of halter traction.  The right side of her neck was prepped and draped in routine sterile fashion.  A curvilinear incision was made just off midline to the anter border of the sternocleidomastoid and the superficial layer of platysma was dissected out divided longitudinally.  The avascular plane between the sternocleidomastoid and strap muscle was developed down to the prevertebral fascia and prevertebral fascia was dissected away with Kitners.  Interoperative x-ray confirmed application appropriate levels along screws reflected laterally and self-retaining retractors were placed annulotomy's were made at all 3 disc bases anterior osteophytes were bitten off the Leksell rongeur and a 2 and 3 Miller Kerrison  punch.  All 3 disc space were drilled down capturing the bone shavings and mucus trap and under microscopic lamination first working at C6-7 this to space was further drilled down aggressive under biting both endplates allowed identification and removal of the posterior large ligament.  Large posterior spurring coming off the endplates and unseen hypertrophy was causing the predominance of the stenosis this was all removed decompressing the central canal and skeletonizing both C7 nerve roots flush with the pedicle.  Attention was then taken at C4-5 pathology here was large soft subligamentous disc herniation this was all removed some spurring off the posterior endplates this was all aggressively under Bitton both C5 nerve roots were also identified decompressed and skeletonized flush with pedicle C5-6 was mostly spondylitic a lot of posterior spurring this was all removed decompressing central canal and both C6 nerve roots were also widely decompressed.  All 3 disc bases excepted 6 mm titanium cages packed with locally harvested autograft mixed with Vivigen I packed extensive mount of autograft material laterally and anteriorly cages well sized up and Atlantis translational plate and all screws had excellent purchase postop imaging confirmed good position of all the implants then the wound was copiously irrigated meticulous hemostasis was maintained I did place a JP drain and closed the wound in layers with after Vicryl and the skin was closed with a running 4 subcuticular.  Dermabond benzoin Steri-Strips and a sterile dressing was applied patient went to cover him in stable condition.  At the end the case all needle count sponge counts were correct.

## 2024-04-17 NOTE — Progress Notes (Signed)
+  MRSA on surgical PCR. Sent message to Dr. Onetha

## 2024-04-17 NOTE — Anesthesia Procedure Notes (Signed)
 Procedure Name: Intubation Date/Time: 04/17/2024 11:41 AM  Performed by: Mollie Olivia SAUNDERS, CRNAPre-anesthesia Checklist: Patient identified, Emergency Drugs available, Suction available, Patient being monitored and Timeout performed Patient Re-evaluated:Patient Re-evaluated prior to induction Oxygen  Delivery Method: Circle System Utilized Preoxygenation: Pre-oxygenation with 100% oxygen  Induction Type: IV induction Ventilation: Mask ventilation without difficulty Laryngoscope Size: Glidescope and 3 Grade View: Grade I Tube type: Oral Tube size: 7.0 mm Number of attempts: 1 Airway Equipment and Method: Oral airway, Rigid stylet and Video-laryngoscopy Placement Confirmation: ETT inserted through vocal cords under direct vision, positive ETCO2, breath sounds checked- equal and bilateral and CO2 detector Secured at: 22 cm Tube secured with: Tape Dental Injury: Teeth and Oropharynx as per pre-operative assessment  Difficulty Due To: Difficulty was anticipated and Difficult Airway-  due to neck instability Comments: Atraumatic

## 2024-04-18 ENCOUNTER — Encounter (HOSPITAL_COMMUNITY): Payer: Self-pay | Admitting: Neurosurgery

## 2024-04-18 MED ORDER — OXYCODONE HCL 5 MG PO TABS: 5.0000 mg | ORAL_TABLET | Freq: Four times a day (QID) | ORAL | 0 refills | Status: AC | PRN

## 2024-04-18 MED FILL — Thrombin For Soln 5000 Unit: CUTANEOUS | Qty: 5000 | Status: AC

## 2024-04-18 NOTE — Discharge Instructions (Signed)
   Wound Care Keep incision covered and dry until post op day 3. You may remove the Honeycomb dressing on post op day 3. Leave steri-strips on neck.  They will fall off by themselves. Do not put any creams, lotions, or ointments on incision. You are fine to shower. Let water run over incision and pat dry.  Activity Walk each and every day, increasing distance each day. No lifting greater than 8 lbs.  Avoid excessive neck motion. No driving, or riding a car unless coming back and forth to see the doctor  Diet Resume your normal diet.   Return to Work Will be discussed at your follow up appointment.  Call Your Doctor If Any of These Occur Redness, drainage, or swelling at the wound.  Temperature greater than 101 degrees. Severe pain not relieved by pain medication. Incision starts to come apart.  Follow Up Appt Call [phone] if you have one or any problem.

## 2024-04-18 NOTE — Discharge Summary (Signed)
 Physician Discharge Summary  Patient ID: Lei Dower MRN: [id number] DOB/AGE: [birth year] 48 y.o. Estimated body mass index is 30.86 kg/m as calculated from the following:   Height as of this encounter: 5' (1.524 m).   Weight as of this encounter: 71.7 kg.   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  Patient will be discharged with scheduled follow-up in 1 to 2 weeks.  Consults: Significant Diagnostic Studies: Treatments: ACDF C3-7 Discharge Exam: Blood pressure 125/77, pulse 81, temperature 98.5 F (36.9 C), temperature source Oral, resp. rate 18, height 5' (1.524 m), weight 71.7 kg, SpO2 100%. Strength 5 out of 5 incision clean dry and intact  Disposition: Home   Allergies as of 04/18/2024       Reactions   Aspirin  Other (See Comments)   Cause bleeding have ulcers.   Ibuprofen  Other (See Comments)   Cause bleeding have ulcers   Tramadol  Nausea Only, Palpitations, Other (See Comments)   Cause bleeding have ulcers   Ancef  [cefazolin ] Hives, Other (See Comments)   Felt like my skin was on fire   Bee Venom         Medication List     TAKE these medications    albuterol  108 (90 Base) MCG/ACT inhaler Commonly known as: VENTOLIN  HFA Inhale 1-2 puffs into the lungs every 6 (six) hours as needed for wheezing or shortness of breath.   AM/PM PERIMENOPAUSE FORMULA PO Take 1 capsule by mouth in the morning. Olly Balanced Perimeno   aspirin  EC 81 MG tablet Take 81 mg by mouth  daily. Swallow whole.   buprenorphine -naloxone  8-2 mg Subl SL tablet Commonly known as: SUBOXONE  Place 1 tablet under the tongue 3 (three) times daily.   chlorhexidine  4 % external liquid Commonly known as: HIBICLENS  Apply 15 mLs (1 Application total) topically as directed for 30 doses. Use as directed daily for 5 days every other week for 6 weeks.   COLLAGEN PO Take 1 capsule by mouth in the morning. Obvi Collagenic Burn Elite   Fixodent Complete Crea Place 1 Application onto teeth daily. Applied to bottom row   hydrochlorothiazide  12.5 MG tablet Commonly known as: HYDRODIURIL  Take 12.5 mg by mouth every evening.   lisinopril  10 MG tablet Commonly known as: ZESTRIL  Take 10 mg by mouth in the morning.   loratadine  10 MG tablet Commonly known as: CLARITIN  Take 10 mg by mouth at bedtime.   multivitamin with minerals Tabs tablet Take 1 tablet by mouth in the morning. Women's Multivitamin   mupirocin  ointment 2 % Commonly known as: BACTROBAN  Place 1 Application into the nose 2 (two) times daily for 60 doses. Use as directed 2 times daily for 5 days every other week for 6 weeks.   nicotine  21 mg/24hr patch Commonly known as: NICODERM CQ  - dosed in mg/24 hours Place 21 mg onto the skin in the morning.   omeprazole 40 MG capsule Commonly known as: PRILOSEC Take 40 mg by mouth every evening.   oxyCODONE  5 MG immediate release tablet Commonly known as: Oxy IR/ROXICODONE  Take 1 tablet (5 mg total) by mouth every  6 (six) hours as needed for severe pain (pain score 7-10).   pregabalin  150 MG capsule Commonly known as: LYRICA  Take 150 mg by mouth 2 (two) times daily.   Symbicort 80-4.5 MCG/ACT inhaler Generic drug: budesonide-formoterol Inhale 1 puff into the lungs in the morning and at bedtime.   Vyvanse  40 MG capsule Generic drug: lisdexamfetamine Take 40 mg by mouth every morning.         Signed: Arley SHAUNNA Helling 04/18/2024, 8:08 AM

## 2024-04-18 NOTE — Progress Notes (Signed)
 PT Cancellation Note and Discharge  Patient Details Name: Mary Jarvis MRN: [id number] DOB: 10/04/1975   Cancelled Treatment:    Reason Eval/Treat Not Completed: PT screened, no needs identified, will sign off. Discussed pt case with OT who reports pt is currently mobilizing at an independent level and does not require a formal PT evaluation at this time. PT signing off. If needs change, please reconsult.     Leita JONETTA Sable 04/18/2024, 8:35 AM  Leita Sable, PT, DPT Acute Rehabilitation Services Secure Chat Preferred Office: [phone]

## 2024-04-18 NOTE — Progress Notes (Signed)
 Patient alert and oriented, mae's well, voiding adequate amount of urine, swallowing without difficulty, no c/o pain at time of discharge. Patient discharged home with husband. Script and discharged instructions given to patient. Patient and husband stated understanding of instructions given. Room was checked and accounted for all patient's belongings; discharge instructions concerning his medications, incision care, follow up appointment and when to call the doctor as needed were all discussed with patient by RN and she expressed understanding on the instructions given.

## 2024-04-18 NOTE — Evaluation (Signed)
 Occupational Therapy Evaluation Patient Details Name: Mary Jarvis MRN: [id number] DOB: 05-Sep-1975 Today's Date: 04/18/2024   History of Present Illness   48 y/o F s/p C4-5, C5-6, C6-7 ACDF on 9/22.     PMH includes chronic pain syndrome, gastric ulcer, GAD, GERD, Hep C, HTN, HLD,kidney stones, liver failure, prediabetes, seizures, tachycardia     Clinical Impressions Pt ind at baseline with ADLs/functional mobility, lives with children who can assist at d/c. Pt currently close to baseline, educated on cervical precautions, collar wear, and compensatory strategies for ADLs. Pt verbalizes and demo's understanding. Pt presenting with impairments listed below, however has no further OT needs at this time, will s/o. Please reconsult if there is a change in pt status. Anticipate no OT follow up needs at d/c.      If plan is discharge home, recommend the following:   A little help with bathing/dressing/bathroom;Assist for transportation     Functional Status Assessment   Patient has had a recent decline in their functional status and demonstrates the ability to make significant improvements in function in a reasonable and predictable amount of time.     Equipment Recommendations   BSC/3in1     Recommendations for Other Services         Precautions/Restrictions   Precautions Precautions: Cervical Precaution Booklet Issued: Yes (comment) Recall of Precautions/Restrictions: Intact Precaution/Restrictions Comments: educated on cervical precautions Required Braces or Orthoses: Cervical Brace Cervical Brace: Soft collar;At all times Restrictions Weight Bearing Restrictions Per Provider Order: No     Mobility Bed Mobility               General bed mobility comments: pt sleeps in recliner at all times    Transfers Overall transfer level: Independent Equipment used: None                      Balance Overall balance assessment: No apparent balance  deficits (not formally assessed)                                         ADL either performed or assessed with clinical judgement   ADL Overall ADL's : At baseline                                       General ADL Comments: pt dressed upon entry and performing ADLs without assist, min cues for precautions     Vision   Vision Assessment?: No apparent visual deficits     Perception Perception: Not tested       Praxis Praxis: Not tested       Pertinent Vitals/Pain Pain Assessment Pain Assessment: No/denies pain     Extremity/Trunk Assessment Upper Extremity Assessment Upper Extremity Assessment: Overall WFL for tasks assessed   Lower Extremity Assessment Lower Extremity Assessment: Overall WFL for tasks assessed   Cervical / Trunk Assessment Cervical / Trunk Assessment: Neck Surgery   Communication Communication Communication: No apparent difficulties   Cognition Arousal: Alert Behavior During Therapy: WFL for tasks assessed/performed Cognition: No apparent impairments                               Following commands: Intact       Cueing  General Comments  Cueing Techniques: Verbal cues  mild SOB with stairs, hoarse voice   Exercises     Shoulder Instructions      Home Living Family/patient expects to be discharged to:: Private residence Living Arrangements: Children (daughter will be there for 4 days) Available Help at Discharge: Family;Available PRN/intermittently Type of Home: House Home Access: Stairs to enter Entrance Stairs-Number of Steps: 2 Entrance Stairs-Rails: Left Home Layout: Multi-level Alternate Level Stairs-Number of Steps: flight Alternate Level Stairs-Rails: Left Bathroom Shower/Tub: Producer, television/film/video: Standard     Home Equipment: Rollator (4 wheels);Cane - single point          Prior Functioning/Environment Prior Level of Function : Independent/Modified  Independent                    OT Problem List: Decreased activity tolerance   OT Treatment/Interventions:        OT Goals(Current goals can be found in the care plan section)   Acute Rehab OT Goals Patient Stated Goal: none stated OT Goal Formulation: With patient Time For Goal Achievement: 05/02/24 Potential to Achieve Goals: Good   OT Frequency:       Co-evaluation              AM-PAC OT 6 Clicks Daily Activity     Outcome Measure Help from another person eating meals?: None Help from another person taking care of personal grooming?: None Help from another person toileting, which includes using toliet, bedpan, or urinal?: None Help from another person bathing (including washing, rinsing, drying)?: A Little Help from another person to put on and taking off regular upper body clothing?: None Help from another person to put on and taking off regular lower body clothing?: None 6 Click Score: 23   End of Session Equipment Utilized During Treatment: Cervical collar Nurse Communication: Mobility status  Activity Tolerance: Patient tolerated treatment well Patient left: with call bell/phone within reach;in chair  OT Visit Diagnosis: Muscle weakness (generalized) (M62.81)                Time: 9243-9180 OT Time Calculation (min): 23 min Charges:  OT General Charges $OT Visit: 1 Visit OT Evaluation $OT Eval Low Complexity: 1 Low OT Treatments $Self Care/Home Management : 8-22 mins  Haneefah Venturini K, OTD, OTR/L SecureChat Preferred Acute Rehab [phone]   Jaelon Gatley K Koonce 04/18/2024, 8:57 AM
